# Patient Record
Sex: Male | Born: 1960 | Race: White | Hispanic: No | Marital: Single | Smoking: Former smoker
Health system: Southern US, Community
[De-identification: ages and names within clinical notes are randomized; demographics above are authoritative.]

## PROBLEM LIST (undated history)

## (undated) DIAGNOSIS — R002 Palpitations: Secondary | ICD-10-CM

## (undated) DIAGNOSIS — L409 Psoriasis, unspecified: Secondary | ICD-10-CM

## (undated) DIAGNOSIS — I10 Essential (primary) hypertension: Secondary | ICD-10-CM

## (undated) DIAGNOSIS — E785 Hyperlipidemia, unspecified: Secondary | ICD-10-CM

## (undated) DIAGNOSIS — R911 Solitary pulmonary nodule: Secondary | ICD-10-CM

## (undated) DIAGNOSIS — I2699 Other pulmonary embolism without acute cor pulmonale: Secondary | ICD-10-CM

## (undated) DIAGNOSIS — J449 Chronic obstructive pulmonary disease, unspecified: Secondary | ICD-10-CM

## (undated) DIAGNOSIS — H3320 Serous retinal detachment, unspecified eye: Secondary | ICD-10-CM

## (undated) DIAGNOSIS — Z9289 Personal history of other medical treatment: Secondary | ICD-10-CM

## (undated) HISTORY — DX: Palpitations: R00.2

## (undated) HISTORY — PX: RETINAL DETACHMENT SURGERY: SHX105

## (undated) HISTORY — PX: THORACENTESIS: SHX235

## (undated) HISTORY — DX: Other pulmonary embolism without acute cor pulmonale: I26.99

## (undated) HISTORY — DX: Essential (primary) hypertension: I10

## (undated) HISTORY — DX: Serous retinal detachment, unspecified eye: H33.20

## (undated) HISTORY — DX: Personal history of other medical treatment: Z92.89

## (undated) HISTORY — DX: Solitary pulmonary nodule: R91.1

## (undated) HISTORY — DX: Psoriasis, unspecified: L40.9

## (undated) HISTORY — DX: Hyperlipidemia, unspecified: E78.5

## (undated) HISTORY — DX: Chronic obstructive pulmonary disease, unspecified: J44.9

---

## 2006-09-23 ENCOUNTER — Emergency Department (HOSPITAL_COMMUNITY): Admission: EM | Admit: 2006-09-23 | Discharge: 2006-09-23 | Payer: Self-pay | Admitting: Emergency Medicine

## 2007-09-02 ENCOUNTER — Ambulatory Visit (HOSPITAL_COMMUNITY): Admission: RE | Admit: 2007-09-02 | Discharge: 2007-09-02 | Payer: Self-pay | Admitting: Ophthalmology

## 2008-11-05 ENCOUNTER — Ambulatory Visit (HOSPITAL_COMMUNITY): Admission: RE | Admit: 2008-11-05 | Discharge: 2008-11-05 | Payer: Self-pay | Admitting: Internal Medicine

## 2009-11-23 ENCOUNTER — Encounter: Payer: Self-pay | Admitting: Internal Medicine

## 2010-03-18 ENCOUNTER — Encounter: Payer: Self-pay | Admitting: Internal Medicine

## 2010-03-18 ENCOUNTER — Ambulatory Visit (HOSPITAL_COMMUNITY): Admission: RE | Admit: 2010-03-18 | Discharge: 2010-03-18 | Payer: Self-pay | Admitting: Internal Medicine

## 2010-05-02 ENCOUNTER — Ambulatory Visit: Payer: Self-pay | Admitting: Internal Medicine

## 2010-05-02 DIAGNOSIS — E782 Mixed hyperlipidemia: Secondary | ICD-10-CM | POA: Insufficient documentation

## 2010-05-02 DIAGNOSIS — Z86711 Personal history of pulmonary embolism: Secondary | ICD-10-CM

## 2010-05-02 DIAGNOSIS — I10 Essential (primary) hypertension: Secondary | ICD-10-CM

## 2010-05-02 DIAGNOSIS — F17200 Nicotine dependence, unspecified, uncomplicated: Secondary | ICD-10-CM | POA: Insufficient documentation

## 2010-06-03 ENCOUNTER — Ambulatory Visit: Payer: Self-pay | Admitting: Internal Medicine

## 2010-06-03 DIAGNOSIS — K219 Gastro-esophageal reflux disease without esophagitis: Secondary | ICD-10-CM

## 2010-06-17 ENCOUNTER — Telehealth: Payer: Self-pay | Admitting: Internal Medicine

## 2010-11-08 ENCOUNTER — Ambulatory Visit (HOSPITAL_COMMUNITY): Admission: RE | Admit: 2010-11-08 | Discharge: 2010-11-08 | Payer: Self-pay | Admitting: Dermatology

## 2010-11-10 ENCOUNTER — Encounter: Payer: Self-pay | Admitting: Internal Medicine

## 2010-11-10 ENCOUNTER — Ambulatory Visit (HOSPITAL_COMMUNITY): Admission: RE | Admit: 2010-11-10 | Discharge: 2010-11-10 | Payer: Self-pay | Admitting: Dermatology

## 2010-12-15 ENCOUNTER — Ambulatory Visit: Payer: Self-pay | Admitting: Internal Medicine

## 2010-12-15 DIAGNOSIS — R918 Other nonspecific abnormal finding of lung field: Secondary | ICD-10-CM

## 2010-12-27 ENCOUNTER — Telehealth (INDEPENDENT_AMBULATORY_CARE_PROVIDER_SITE_OTHER): Payer: Self-pay | Admitting: *Deleted

## 2011-01-13 ENCOUNTER — Other Ambulatory Visit: Payer: Self-pay | Admitting: Internal Medicine

## 2011-01-13 DIAGNOSIS — R911 Solitary pulmonary nodule: Secondary | ICD-10-CM

## 2011-01-14 ENCOUNTER — Other Ambulatory Visit: Payer: Self-pay | Admitting: Internal Medicine

## 2011-01-24 NOTE — Letter (Signed)
Summary: [facility]  [facility]   Imported By: Sherian Rein 05/09/2010 07:41:29  _____________________________________________________________________  External Attachment:    Type:   Image     Comment:   External Document

## 2011-01-24 NOTE — Assessment & Plan Note (Signed)
Summary: ROV AFTER PFT ///KP   Copy to:  Dr. Marisue Brooklyn Primary Provider/Referring Provider:  Dr. Elisabeth Most  CC:  follow up after PFT.  no complaints.  History of Present Illness: IOV 05/02/2010: EX 35 pack smoker. Quit  April 2011.   Main issue for him is dyspnea. He feels he cannot get his breath. Feels he needs to take a deep breath. Dyspnea present since 2003 when he had PE.  Stable since 2003 but past 1 year has had progressive worsening of dyspnea. Was able to work out in gym. Currently unable to work out in gym. Dyspnea brought on with exertion and activity. Also feels dyspneic even at rest but worse with exertion. Ventolin helps dyspnea but only briefly. Activities like carrying shoots of concrete truck, < 1 minute of jogging, 8-9 steps on a ladder.   Reports associated cough since 2003. But this is also worse since past 1 year. Used to cough only early morning but now coughing all the time. Cough is a  dry cough. Brought on with exertion. Relieved with rest. Ventolin transiently helps cough. Cough worst in am. Cough when severe can be associated with choking, gagging and vomitting and dizzy. NOTE: has been on ACE INHIBITOR and ATENOLOL  since 2003 (see past med hx)  Also reports associated wheezing. Denies hemoptysis, chest pains, syncope, edema, diplopia  June 03, 2010- Dyspnea, hx tobacco-quit, psoriasis/ Enbrel Comes to review PFT. Dr Marchelle Gearing OOT today. Admits smoking still of and on, with 30+ pack year hx. 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 He is breathing better with much less coughing since meds were changed. Lisinopril was switched to Benicar, B-blocker was stopped. Mostly dyspnea without much wheeze or phlegm. Heart burn is constant problem " all the time". Never had pneumovax. last flu vax year ago PFT- mod obstruct FEV1 3.14/82%; R 0.54, insig resp to BD, DLCO 75%   Asthma History    Asthma Control Assessment:    Age range: 12+ years    Symptoms:  0-2 days/week    Nighttime Awakenings: 0-2/month    Interferes w/ normal activity: some limitations    SABA use (not for EIB): 0-2 days/week    Asthma Control Assessment: Not Well Controlled   Preventive Screening-Counseling & Management  Alcohol-Tobacco     Smoking Status: quit < 6 months     Packs/Day: 1.0     Pack years: 35     Tobacco Counseling: not to resume use of tobacco products  Medications Prior to Update: 1)  Advair Diskus 250-50 Mcg/dose Aepb (Fluticasone-Salmeterol) .... One Puff Twice Daily 2)  Ativan 2 Mg Tabs (Lorazepam) .... Take 1 Tablet By Mouth Once A Day 3)  Ventolin Hfa 108 (90 Base) Mcg/act Aers (Albuterol Sulfate) .... As Needed 4)  Enbrel 50 Mg/ml Soln (Etanercept) .... Take 1 Tablet By Mouth Once A Day 5)  Lipitor 20 Mg Tabs (Atorvastatin Calcium) .... Take 1 Tablet By Mouth Once A Day 6)  Testim 1 % Gel (Testosterone) .... As Needed  Current Medications (verified): 1)  Advair Diskus 500-50 Mcg/dose Aepb (Fluticasone-Salmeterol) .Marland Kitchen.. 1 Puff and Rinse Mouth Well, Twice Daily 2)  Ativan 2 Mg Tabs (Lorazepam) .... Take 3 Tablet By Mouth Once A Day 3)  Lipitor 20 Mg Tabs (Atorvastatin Calcium) .... Take 1 Tablet By Mouth Once A Day 4)  Enbrel 50 Mg/ml Soln (Etanercept) .... Once A Week 5)  Ventolin Hfa 108 (90 Base) Mcg/act Aers (Albuterol Sulfate) .... As Needed 6)  Testim 1 % Gel (Testosterone) .... As Needed 7)  Benicar Hct 40-12.5 Mg Tabs (Olmesartan Medoxomil-Hctz) .... Take 1 Tablet By Mouth Once A Day  Allergies (verified): 1)  ! Haldol  Past History:  Past Surgical History: Last updated: May 04, 2010 @ detached retinal Surgeries  Family History: Last updated: 2010-05-04 Father-emphysema, died age 105, smoker Mother-asthma, allergies  Social History: Last updated: 2010-05-04 Patient states former smoker.  SMOKED X 75YRS AVG 1PPD. Quit in 03/2010.  ETOH-moderate Single  Grew up in [place]. Moved to [place] in 2006  Risk Factors: Smoking Status:  quit < 6 months (06/03/2010) Packs/Day: 1.0 (06/03/2010)  Past Medical History: #Asthma > present all life. Diagnosed in childhood. Has had childhood admissions. Been on ICS since 2003 only > PFT 06/03/10 FEV1 3.14/ 82%, FEV1/FVC 0.54; insig resp to BD, DLCO 75%, Nl Lung Vol  #Hyperlipidemia #Hypertension > On lisinopril  and  2002/2003  #Palpitations...cardiology in [place] > Started atenolol since 2003. REcollects hx of holter. Informed he had 'etxtra beat'. INformed it "not a dangerous condition"  #Pulmonary Embolism 2003 > Does not recollect  tpA or being on lot of oxygen or hypotension. 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 Started in [place].   #Detached retina  #TB Skin test negative > 2005 - 2010, annual checks  Social History: Smoking Status:  quit < 6 months Packs/Day:  1.0 Pack years:  35  Review of Systems      See HPI       The patient complains of shortness of breath with activity and acid heartburn.  The patient denies shortness of breath at rest, productive cough, non-productive cough, coughing up blood, chest pain, irregular heartbeats, indigestion, loss of appetite, weight change, abdominal pain, difficulty swallowing, sore throat, tooth/dental problems, headaches, nasal congestion/difficulty breathing through nose, and sneezing.    Vital Signs:  Patient profile:   50 year old male Height:      73 inches Weight:      219 pounds BMI:     29.00 O2 Sat:      95 % on Room air Pulse rate:   79 / minute BP sitting:   126 / 86  (left arm) Cuff size:   regular  Vitals Entered By: Boone Master CNA/MA (June 03, 2010 4:31 PM)  O2 Flow:  Room air  Physical Exam  Additional Exam:  General: A/Ox3; pleasant and cooperative, NAD, muscular/ fit appearing man SKIN: traces of psoriatic crusitn on arms NODES: no lymphadenopathy HEENT: [place]/AT, EOM- WNL, Conjuctivae-  clear, PERRLA, TM-WNL, Nose- clear, Throat- clear and wnl, Mallampati  II NECK: Supple w/ fair ROM, JVD- none, normal carotid impulses w/o bruits Thyroid- normal to palpation CHEST: Clear to P&A HEART: RRR, no m/g/r heard ABDOMEN: Soft and nl;  ZOX:WRUE, nl pulses, no edema  NEURO: Grossly intact to observation      Impression & Recommendations:  Problem # 1:  TOBACCO ABUSE (ICD-305.1)  Counseling done. He is not smoking at addiction levels now and just needs  motivation.  Problem # 2:  EMPHYSEMA (ICD-492.8)  Moderate Emphysema by PFT. Not needing additional med now, but Spiriva could be considered for retrial  later if needed.  he continues Advair and we will send refill. Recomended and he agrees to pneumovax today given extra risks of Enbrel and COPD Past hx of PE could also explain reduced DLCO.  Problem # 3:  GERD (ICD-530.81) Significant discomfort described. Discussed his otc acid reducer. Recommended twice daily before meals, elevate head of bed. Reflux precautions discussed.  Medications Added to Medication List This Visit: 1)  Advair Diskus 500-50 Mcg/dose Aepb (Fluticasone-salmeterol) .Marland Kitchen.. 1 puff and rinse mouth well, twice daily 2)  Ativan 2 Mg Tabs (Lorazepam) .... Take 3 tablet by mouth once a day 3)  Enbrel 50 Mg/ml Soln (Etanercept) .... Once a week 4)  Benicar Hct 40-25 Mg Tabs (Olmesartan medoxomil-hctz) .... Take 1 tablet by mouth once a day 5)  Benicar Hct 40-12.5 Mg Tabs (Olmesartan medoxomil-hctz) .... Take 1 tablet by mouth once a day  Other Orders: Est. Patient Level IV (13244) Pneumococcal Vaccine (01027) Admin 1st Vaccine (25366)  Patient Instructions: 1)  Follow up with Dr Marchelle Gearing in 3 months 2)  Consider elevating the head of your bed frame on 1 brick under each head leg. 3)  Take your acid reliever twice daily before meals. 4)  Continue Advair- changed to the 500 as requested 5)  Continue Benicar 6)  It's time to call it quits for good on  those cigarettes. 7)  Pneumoavax Prescriptions: BENICAR HCT 40-12.5 MG TABS (OLMESARTAN MEDOXOMIL-HCTZ) Take 1 tablet by mouth once a day  #90 x 3   Entered by:   Boone Master CNA/MA   Authorized by:   Waymon Budge MD   Signed by:   Boone Master CNA/MA on 06/03/2010   Method used:   Faxed to ...       [facility])       [address] [place], [place]  [address]       Ph: [phone]       Fax: [phone]   RxID:   [id number] ADVAIR DISKUS 500-50 MCG/DOSE AEPB (FLUTICASONE-SALMETEROL) 1 puff and rinse mouth well, twice daily  #3 x 3   Entered by:   Boone Master CNA/MA   Authorized by:   Waymon Budge MD   Signed by:   Boone Master CNA/MA on 06/03/2010   Method used:   Faxed to ...       [facility] (mail[facility]order)       [address] [place], [place]  [address]       Ph: [phone]       Fax: [phone]   RxID:   [id number] BENICAR HCT 40-25 MG TABS (OLMESARTAN MEDOXOMIL-HCTZ) Take 1 tablet by mouth once a day  #90 x 3   Entered and Authorized by:   Waymon Budge MD   Signed by:   Waymon Budge MD on 06/03/2010   Method used:   Printed then faxed to ...       [facility] (mail[facility]order)       [address] [place], [place]  [address]       Ph: [phone]       Fax: [phone]   RxID:   [id number] ADVAIR DISKUS 500-50 MCG/DOSE AEPB (FLUTICASONE-SALMETEROL) 1 puff and rinse mouth well, twice daily  #3 x 3   Entered and Authorized by:   Waymon Budge MD   Signed by:   Waymon Budge MD on 06/03/2010   Method used:   Printed then faxed to ...       [facility] (mail[facility])       [address]       [place], [place]  [address]       Ph: [phone]       Fax: [phone]   RxID:   [id number]    Immunizations Administered:  Pneumonia Vaccine:    Vaccine Type: Pneumovax    Site: right deltoid    Mfr: Merck    Dose: 0.5 ml    Route: IM    Given by: Boone Master CNA/MA    Exp. Date: 04/14/2011    Lot #: [id number]     VIS given: 07/22/96 version given June 03, 2010.

## 2011-01-24 NOTE — Assessment & Plan Note (Signed)
Summary: copd w/cardiac enlargement/apc   Visit Type:  Initial Consult Copy to:  Dr. Marisue Brooklyn Primary Provider/Referring Provider:  Dr. Elisabeth Most  CC:  Pulmonary Consult for SOB with activity. Dr. Elisabeth Most diagnosed pt with COPD in 02/2010.Marland Kitchen  History of Present Illness: IOV 05/07/2010: EX 35 pack smoker. Quit  April 2011.   Main issue for him is dyspnea. He feels he cannot get his breath. Feels he needs to take a deep breath. Dyspnea present since 2003 when he had PE.  Stable since 2003 but past 1 year has had progressive worsening of dyspnea. Was able to work out in gym. Currently unable to work out in gym. Dyspnea brought on with exertion and activity. Also feels dyspneic even at rest but worse with exertion. Ventolin helps dyspnea but only briefly. Activities like carrying shoots of concrete truck, < 1 minute of jogging, 8-9 steps on a ladder.   Reports associated cough since 2003. But this is also worse since past 1 year. Used to cough only early morning but now coughing all the time. Cough is a  dry cough. Brought on with exertion. Relieved with rest. Ventolin transiently helps cough. Cough worst in am. Cough when severe can be associated with choking, gagging and vomitting and dizzy. NOTE: has been on ACE INHIBITOR and ATENOLOL  since 2003 (see past med hx)  Also reports associated wheezing. Denies hemoptysis, chest pains, syncope, edema, diplopia  Preventive Screening-Counseling & Management  Alcohol-Tobacco     Smoking Status: quit  Current Medications (verified): 1)  Advair Diskus 250-50 Mcg/dose Aepb (Fluticasone-Salmeterol) .... One Puff Twice Daily 2)  Atenolol 25 Mg Tabs (Atenolol) .... Take 1 Tablet By Mouth Once A Day 3)  Ativan 2 Mg Tabs (Lorazepam) .... Take 1 Tablet By Mouth Once A Day 4)  Lipitor 20 Mg Tabs (Atorvastatin Calcium) .... Take 1 Tablet By Mouth Once A Day 5)  Enbrel 50 Mg/ml Soln (Etanercept) .... Take 1 Tablet By Mouth Once A Day 6)   Lisinopril-Hydrochlorothiazide 20-25 Mg Tabs (Lisinopril-Hydrochlorothiazide) .... Take 1 Tablet By Mouth Once A Day 7)  Ventolin Hfa 108 (90 Base) Mcg/act Aers (Albuterol Sulfate) .... As Needed 8)  Testim 1 % Gel (Testosterone) .... As Needed  Allergies (verified): 1)  ! Haldol  Past History:  Family History: Last updated: 05-07-2010 Father-emphysema, died age 52, smoker Mother-asthma, allergies  Social History: Last updated: 07-May-2010 Patient states former smoker.  SMOKED X 6YRS AVG 1PPD. Quit in 03/2010.  ETOH-moderate Single  Grew up in [place]. Moved to [place] in 2006  Risk Factors: Smoking Status: quit (May 07, 2010)  Past Medical History: #Asthma > present all life. Diagnosed in childhood. Has had childhood admissions. Been on ICS since 2003 only  #Hyperlipidemia #Hypertension > On lisinopril  and  2002/2003  #Palpitations...cardiology in [place] > Started atenolol since 2003. REcollects hx of holter. Informed he had 'etxtra beat'. INformed it "not a dangerous condition"  #Pulmonary Embolism 2003 > Does not recollect  tpA or being on lot of oxygen or hypotension. 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 Started in [place].   #Detached retina  #TB Skin test negative > 2005 - 2010, annual checks  Past Surgical History: @ detached retinal Surgeries  Family History: Father-emphysema, died age 75, smoker Mother-asthma, allergies  Social History: Patient states former smoker.  SMOKED X 6YRS AVG 1PPD. Quit in 03/2010.  ETOH-moderate Single  Grew up  in [place]. Moved to [place] in 2006 Smoking Status:  quit  Review of Systems       The patient complains of shortness of breath with activity, shortness of breath at rest, productive cough, non-productive cough, acid heartburn, and anxiety.  The patient denies coughing up blood, chest pain, irregular heartbeats, indigestion, loss of  appetite, weight change, abdominal pain, difficulty swallowing, sore throat, tooth/dental problems, headaches, nasal congestion/difficulty breathing through nose, sneezing, itching, ear ache, depression, hand/feet swelling, joint stiffness or pain, rash, change in color of mucus, and fever.    Vital Signs:  Patient profile:   50 year old male Height:      70 inches Weight:      219 pounds BMI:     31.54 O2 Sat:      98 % on Room air Temp:     97.2 degrees F oral Pulse rate:   70 / minute BP sitting:   122 / 78  (right arm) Cuff size:   regular  Vitals Entered By: Carron Curie CMA (May 02, 2010 3:37 PM)  O2 Flow:  Room air CC: Pulmonary Consult for SOB with activity. Dr. Elisabeth Most diagnosed pt with COPD in 02/2010. Comments Medications reviewed with patient Carron Curie CMA  May 02, 2010 3:42 PM Daytime phone number verified with patient. Ambulatory Pulse Oximetry  Resting; HR_65____    02 Sat__99%RA___  Lap1 (185 feet)   HR_86____   02 Sat_98RA____ Lap2 (185 feet)   HR__87___   02 Sat__97%RA___    Lap3 (185 feet)   HR_84____   02 Sat__96%RA___  _X__Test Completed without Difficulty ___Test Stopped due to:      Physical Exam  General:  well developed, well nourished, in no acute distress Head:  normocephalic and atraumatic Eyes:  PERRLA/EOM intact; conjunctiva and sclera clear Ears:  left ear fine rt ear - ? plaque like lesions on ear drum Nose:  no deformity, discharge, inflammation, or lesions Mouth:  no deformity or lesions Neck:  no masses, thyromegaly, or abnormal cervical nodes Chest Wall:  no deformities noted Lungs:  clear bilaterally to auscultation and percussion Heart:  regular rate and rhythm, S1, S2 without murmurs, rubs, gallops, or clicks Abdomen:  bowel sounds positive; abdomen soft and non-tender without masses, or organomegaly Msk:  no deformity or scoliosis noted with normal posture Pulses:  pulses normal Extremities:  no clubbing,  cyanosis, edema, or deformity noted Neurologic:  CN II-XII grossly intact with normal reflexes, coordination, muscle strength and tone Skin:  intact without lesions or rashes Cervical Nodes:  no significant adenopathy Axillary Nodes:  no significant adenopathy Psych:  alert and cooperative; normal mood and affect; normal attention span and concentration   CXR  Procedure date:  03/18/2010  Findings:       Clinical Data: Cough for 2 weeks.  History of asthma.  Nonsmoker.    CHEST - 2 VIEW    Comparison: 09/02/2007    Findings: Lung fields demonstrate mild COPD changes.  Taking this   into consideration heart size is mildly enlarged and stable.  A   normal mediastinal configuration is seen.    The lung fields demonstrate no focal infiltrates or signs of   congestive failure.  No significant peribronchial cuffing or   pleural fluid is seen.    Bony structures appear intact.    IMPRESSION:   Stable cardiopulmonary appearance with no acute abnormality noted.   Mild COPD changes with mild cardiac enlargement.    Read By:  Bertha Stakes,  M.D.   Released By:  Bertha Stakes,  M.D.   Comments:      independently reviewed  Impression & Recommendations:  Problem # 1:  SHORTNESS OF BREATH (SOB) (ICD-786.05) Assessment New  Suspect dyspnean and cough are due to underlying asthma made worse by atenolol and lisinopril since 2003 and possibly byhim developing copd last year due to smoking.   plan first stop atenolol and lisinopril reassess with PFTs in 1 month  Orders: Prescription Created Electronically [id number])  Problem # 2:  COUGH (ICD-786.2) Assessment: New  per dyspnea  Orders: Prescription Created Electronically [phone])  Problem # 3:  TOBACCO ABUSE (ICD-305.1) Assessment: New  35 pack smoker. Quit April 2011. Encouraged remission  Orders: Prescription Created Electronically [phone])  Problem # 4:  HYPERTENSION (ICD-401.9) Assessment:  New PLAN  The following medications were removed from the medication list:    Atenolol 25 Mg Tabs (Atenolol) .Marland Kitchen... Take 1 tablet by mouth once a day    Lisinopril-hydrochlorothiazide 20-25 Mg Tabs (Lisinopril-hydrochlorothiazide) .Marland Kitchen... Take 1 tablet by mouth once a day His updated medication list for this problem includes:    Benicar Hct 40-12.5 Mg Tabs (Olmesartan medoxomil-hctz) ..... One tablet by mouth daily  Problem # 5:  PALPITATIONS, HX OF (ICD-V12.50) Assessment: New  per hx these were considered benign in 2003. Been on atenolol since then without recurrence. We are stopping atenolol due to cough, wheezing and dyspnea. We will monitor recurrence of palpitations. If they recur, start bisoprolol and send to cards  Orders: Prescription Created Electronically [phone])  Medications Added to Medication List This Visit: 1)  Advair Diskus 250-50 Mcg/dose Aepb (Fluticasone-salmeterol) .... One puff twice daily 2)  Atenolol 25 Mg Tabs (Atenolol) .... Take 1 tablet by mouth once a day 3)  Ativan 2 Mg Tabs (Lorazepam) .... Take 1 tablet by mouth once a day 4)  Lipitor 20 Mg Tabs (Atorvastatin calcium) .... Take 1 tablet by mouth once a day 5)  Enbrel 50 Mg/ml Soln (Etanercept) .... Take 1 tablet by mouth once a day 6)  Lisinopril-hydrochlorothiazide 20-25 Mg Tabs (Lisinopril-hydrochlorothiazide) .... Take 1 tablet by mouth once a day 7)  Ventolin Hfa 108 (90 Base) Mcg/act Aers (Albuterol sulfate) .... As needed 8)  Testim 1 % Gel (Testosterone) .... As needed 9)  Benicar Hct 40-12.5 Mg Tabs (Olmesartan medoxomil-hctz) .... One tablet by mouth daily  Other Orders: Consultation Level V (51884)  Patient Instructions: 1)  stop atenolol 2)  stop lisinopril 3)  start benicar 40-12.5mg  by mouth daily 4)  check bp daily 5)  if high call us 6)  if you have recurrence of palpitations call us 7)  return in 1 month to review progress 8)  full PFT at time of return 9)  talk to your doctor about  your right ear - looks abnormal Prescriptions: BENICAR HCT 40-12.5 MG  TABS (OLMESARTAN MEDOXOMIL-HCTZ) One tablet by mouth daily  #30 x 2   Entered and Authorized by:   Kalman Shan MD   Signed by:   Kalman Shan MD on 05/02/2010   Method used:   Print then Give to Patient   RxID:   [id number]

## 2011-01-24 NOTE — Miscellaneous (Signed)
Summary: Orders Update pft charges  Clinical Lists Changes  Orders: Added new Service order of Carbon Monoxide diffusing w/capacity (94720) - Signed Added new Service order of Lung Volumes (94240) - Signed Added new Service order of Spirometry (Pre & Post) (94060) - Signed 

## 2011-01-24 NOTE — Letter (Signed)
Summary: [facility]  [facility]   Imported By: Sherian Rein 05/09/2010 07:40:08  _____________________________________________________________________  External Attachment:    Type:   Image     Comment:   External Document

## 2011-01-24 NOTE — Progress Notes (Signed)
Summary: Patient switched back from Advair 500 to 250  Phone Note From Pharmacy   Summary of Call: Letter from [facility]- Ptient has filled Advair at the 250 sterngth. Med list updated. Initial call taken by: Waymon Budge MD,  June 17, 2010 9:05 AM    New/Updated Medications: ADVAIR DISKUS 250-50 MCG/DOSE AEPB (FLUTICASONE-SALMETEROL) 1 puff and rinse mouth twice daily Prescriptions: ADVAIR DISKUS 250-50 MCG/DOSE AEPB (FLUTICASONE-SALMETEROL) 1 puff and rinse mouth twice daily  #3 x 3   Entered and Authorized by:   Waymon Budge MD   Signed by:   Waymon Budge MD on 06/17/2010   Method used:   Historical   RxID:   [id number]

## 2011-01-26 NOTE — Assessment & Plan Note (Signed)
Summary: LUNG NODULE - CT (DONE AT [facility]) //KP   Visit Type:  Follow-up Copy to:  Dr. Marisue Brooklyn Primary Amantha Sklar/Referring Kamy Poinsett:  Dr. Elisabeth Most - PMD, Dr. Elmon Else - Dermatology  CC:  Pt here to review CT results. .  History of Present Illness: Followup mulitfactoria cough (copd, ace inhibitor), smoker (quit april 2011,relapsed summer 2011), emphsyema/copd (normal PFT while on advair but isolated low dlco 77% as of june 2011, CT chest +ve emphysema - nov 2011). 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  I have not seen him since March 2011. In interim he saw Dr. Maple Hudson in June 2011 who reivwed his PFTs and advised to continue advair. His cough is significantly improved to nearly resolved after he stopped ACE inhibitor. Curently benicar but has cost issues. Feels stopping atenolol did not help resolve cough. Palpiations back after stoping atenolol and therefore taking it as needed. Stil has class 2 dyspnea despite advair compliance; dyspneic climbing 2 flights but no chest pain, wheeze. In terms of smoking, relapsed. Now quit again for 3 months using electronic cigarettes. Main new issue is that dermatologist did cxr 10/29/2010 and showed LLL nodule. CT 11/10/2010 - 5mm RLLnodule subpleuarl suggestive of lmph node. Therefore, TNF-alpha blockade is on hold     Preventive Screening-Counseling & Management  Alcohol-Tobacco     Smoking Status: quit < 6 months     Packs/Day: 1.0     Year Quit: 2011     Pack years: 57     Tobacco Counseling: not to resume use of tobacco products  Comments: using electorinic cigaretters  Current Medications (verified): 1)  Advair Diskus 250-50 Mcg/dose Aepb (Fluticasone-Salmeterol) .Marland Kitchen.. 1 Puff and Rinse Mouth Twice Daily 2)  Ventolin Hfa 108 (90 Base) Mcg/act Aers (Albuterol Sulfate) .... As Needed 3)  Ativan 2 Mg Tabs  (Lorazepam) .... Take 1 Tablet By [employer] Times A Day 4)  Lipitor 20 Mg Tabs (Atorvastatin Calcium) .... Take 1 Tablet By Mouth Once A Day 5)  Benicar Hct 40-12.5 Mg Tabs (Olmesartan Medoxomil-Hctz) .... Take 1 Tablet By Mouth Once A Day  Allergies (verified): 1)  ! Haldol  Past History:  Past medical, surgical, family and social histories (including risk factors) reviewed, and no changes noted (except as noted below).  Past Medical History: #Asthma  - present all life. Diagnosed in childhood. Has had childhood admissions. Been on ICS since 2003 only  -  PFT 06/03/10 FEV1 3.14/ 82%, FEV1/FVC 0.54; insig resp to BD, DLCO 75%, Nl Lung Vol  #Hyperlipidemia #Hypertension  - On lisinopril  and  2002/2003  - change to ARB march 2011  #Palpitations...cardiology in [place]  - Started atenolol since 2003. REcollects hx of holter. Informed he had 'etxtra beat'. INformed it "not a dangerous condition" - change to lopressor 12/15/2010  #Pulmonary Embolism 2003  - Does not recollect  tpA or being on lot of oxygen or hypotension. 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 Started in [place]. Stopped 2011 by [place] derm due to nodule on chest  #Detached retina  #TB Skin test negative  - 2005 - 2010, annual checks  #RLL Nodule 5mm on CT 11/10/2010  - likely lymph node  Past Surgical History: Reviewed history from 05/02/2010 and no changes required. @ detached  retinal Surgeries  Family History: Reviewed history from 05/02/2010 and no changes required. Father-emphysema, died age 51, smoker Mother-asthma, allergies  Social History: Reviewed history from 05/02/2010 and no changes required. Patient states former smoker.  SMOKED X 46YRS AVG 1PPD. Quit in 03/2010.  ETOH-moderate Single  Grew up in [place]. Moved to [place] in 2006  Review of Systems  The patient denies shortness of breath with  activity, shortness of breath at rest, productive cough, non-productive cough, coughing up blood, chest pain, irregular heartbeats, acid heartburn, indigestion, loss of appetite, weight change, abdominal pain, difficulty swallowing, sore throat, tooth/dental problems, headaches, nasal congestion/difficulty breathing through nose, sneezing, itching, ear ache, anxiety, depression, hand/feet swelling, joint stiffness or pain, rash, change in color of mucus, and fever.    Vital Signs:  Patient profile:   50 year old male Height:      73 inches Weight:      234.38 pounds BMI:     31.03 O2 Sat:      95 % on Room air Temp:     98.2 degrees F oral Pulse rate:   76 / minute BP sitting:   114 / 78  (right arm) Cuff size:   regular  Vitals Entered By: Carron Curie CMA (December 15, 2010 9:00 AM)  O2 Flow:  Room air CC: Pt here to review CT results.  Comments Medications reviewed with patient Carron Curie CMA  December 15, 2010 9:02 AM Daytime phone number verified with patient.    Physical Exam  General:  well developed, well nourished, in no acute distress Head:  normocephalic and atraumatic Eyes:  PERRLA/EOM intact; conjunctiva and sclera clear Ears:  left ear fine rt ear - ? plaque like lesions on ear drum Nose:  no deformity, discharge, inflammation, or lesions Mouth:  no deformity or lesions Neck:  no masses, thyromegaly, or abnormal cervical nodes Chest Wall:  no deformities noted Lungs:  clear bilaterally to auscultation and percussion Heart:  regular rate and rhythm, S1, S2 without murmurs, rubs, gallops, or clicks Abdomen:  bowel sounds positive; abdomen soft and non-tender without masses, or organomegaly Msk:  no deformity or scoliosis noted with normal posture Pulses:  pulses normal Extremities:  no clubbing, cyanosis, edema, or deformity noted Neurologic:  CN II-XII grossly intact with normal reflexes, coordination, muscle strength and tone Skin:  psoriasis on  shin Cervical Nodes:  no significant adenopathy Axillary Nodes:  no significant adenopathy Psych:  alert and cooperative; normal mood and affect; normal attention span and concentration   CT of Chest  Procedure date:  11/10/2010  Findings:      cxr 10/29/2010 and showed LLL nodule. CT 11/10/2010 - 5mm RLLnodule subpleuarl suggestive of lmph node.   Also emphysema Aso bilatera LL scars - presumably from old PE  Impression & Recommendations:  Problem # 1:  PULMONARY NODULE, RIGHT LOWER LOBE (ICD-518.89) Assessment New CXR 10/29/2010 suggested LLL nodule but on CT 11/10/2010 this is  a scar. However, CT shows RLL 5mm nodule that is smooth and subpleural. Despite age and smoking as risk fctors for lung cancer, based on size, location near pleural and smoothness this is unlikely malignancy. GRanuloma from stay in [place] is possible. But lymph node is moist likely  PLAN - serial monitoring for 2 year is advised, next CT chest SUPER D PROTOCOL  6 months - Wil biopsy if need for TNF-alpha blocker need  arises (currently only dermal psoriasis and not a strong indication for humira/enbrel per Dr Emily Filbert and moreover  patient unwilling) or it enlarges provided ENB amenable-  d/w Dr Emily Filbert his dermatologist  Orders: Radiology Referral (Radiology) Est. Patient Level V [phone])  Problem # 2:  TOBACCO ABUSE (ICD-305.1) Assessment: Improved  encourage to staty quit advised I would read abut electronic cigs efficacty and safety  cojunselling  Orders: Est. Patient Level V (62130) Tobacco use cessation intermediate 3-10 minutes (86578)  Problem # 3:  COUGH (ICD-786.2) Assessment: Improved  resolved after dc of ace inhibitor $ issues with benicar so will switch to cozaar he will call in 3 weeks to get 3 month mail order if this tolerable and affordable  Orders: Est. Patient Level V (46962)  Problem # 4:  SHORTNESS OF BREATH (SOB) (ICD-786.05) Assessment: Unchanged this is due to  deconditioning. COPD appears well controlled on PFTs in june 2011. He has schedue issues and cannot attend rehab  plan advised gentle aerobic exercises iwth HR and pulse ox limit if this does not work, then will have to consider echo/right heart cath to assess for pulmonary hypertension given hx of PE  Problem # 5:  EMPHYSEMA (ICD-492.8) Assessment: Unchanged stable disease  plan continue advair next visit check alpha 1 due to family hx  Problem # 6:  PALPITATIONS, OCCASIONAL (ICD-785.1) Assessment: Unchanged try lopressor instead of atenolold  His updated medication list for this problem includes:    Metoprolol Tartrate 50 Mg Tabs (Metoprolol tartrate) .Marland Kitchen... Take half tablet daily  Orders: Prescription Created Electronically [phone])  Medications Added to Medication List This Visit: 1)  Ativan 2 Mg Tabs (Lorazepam) .... Take 1 tablet by mouththree times a day 2)  Losartan Potassium-hctz 50-12.5 Mg Tabs (Losartan potassium-hctz) .... Take 1 tablet daily 3)  Metoprolol Tartrate 50 Mg Tabs (Metoprolol tartrate) .... Take half tablet daily  Patient Instructions: 1)  #SHORTNESS OF BREATH/COPD 2)   - this is from copd and being out of shape (latter more likely) 3)  - continue advair - take 1 sample 4)   - do aerobic exercise - do not get HR >140 or let oxygen level go below 88% 5)  #COUGH 6)   - change benicar to cozaar  7)   - call 1 in 3 weeks to report if cozaar working for you and $ effective. If so, then we will call 3 month supply 8)  #PALPITATIONS 9)   - try metoprolol instead of atenolol 10)   - call i n3 weeks so we can send order to mail order 11)  #NODULE RIGHT LOWER LOBE 12)   - repeat CT chest in 6 months from thanksgiving 2011 13)  #RETURN 14)   - after CT chest 15)  #SMOKING 16)   - be in remission Prescriptions: METOPROLOL TARTRATE 50 MG TABS (METOPROLOL TARTRATE) take half tablet daily  #15 x 1   Entered and Authorized by:   Kalman Shan MD   Signed by:    Kalman Shan MD on 12/15/2010   Method used:   Electronically to        [facility] [address] (retail)       [address]       [place], [place]  [address]       Ph: [phone]       Fax: [phone]   RxID:   [id number] LOSARTAN POTASSIUM-HCTZ 50-12.5 MG TABS (LOSARTAN POTASSIUM-HCTZ) take 1 tablet daily  #30 x 1   Entered and Authorized by:   Carmin Muskrat  Ramaswamy MD   Signed by:   Kalman Shan MD on 12/15/2010   Method used:   Electronically to        [facility] [address] (retail)       [address]       [place], [place]  [address]       Ph: [phone]       Fax: [phone]   RxID:   [id number]

## 2011-01-26 NOTE — Progress Notes (Signed)
Summary: high bp/ new meds > increase losartan to 100-12.5mg   Phone Note Call from Patient Call back at Home Phone [phone]   Caller: Patient Call For: ramaswamy Summary of Call: pt c/o high bp due to change in bp meds at last ov 12/22. [facility] [employer] Initial call taken by: Tivis Ringer, CNA,  December 27, 2010 2:35 PM  Follow-up for Phone Call        Spoke with pt.  He states that since BP meds were changed his BP has been elevated- highest reading was 180/100 and average reading is around 145/90.  He was taking benicar 40/12.5 and as of last ov 12/22 was switched to losartan 50/12.5 and metoprolol tartrate 50 mg 1/2 once daily.  Will forward to doc of the day. SN, pls advise thanks Follow-up by: Vernie Murders,  December 27, 2010 4:42 PM  Additional Follow-up for Phone Call Additional follow up Details #1::        per SN: need to increase losartan to 100-12.5mg , this is equivalent to his benicar 40mg .  called spoke with patient, advised of SN's recs.  pt okay with this and verbalized his understanding.  pt requests rx be sent to [facility] [address], 30day supply.  pt to call in 2 weeks with update on BP's.  pt declined f/u appt with MR at this time, prefers to see how the increase works for him. Additional Follow-up by: Boone Master CNA/MA,  December 27, 2010 5:12 PM    New/Updated Medications: LOSARTAN POTASSIUM-HCTZ 100-12.5 MG TABS (LOSARTAN POTASSIUM-HCTZ) Take 1 tablet by mouth once a day Prescriptions: LOSARTAN POTASSIUM-HCTZ 100-12.5 MG TABS (LOSARTAN POTASSIUM-HCTZ) Take 1 tablet by mouth once a day  #30 x 1   Entered by:   Boone Master CNA/MA   Authorized by:   Michele Mcalpine MD   Signed by:   Boone Master CNA/MA on 12/27/2010   Method used:   Electronically to        [facility] [address] (retail)       [address]       [place], [place]  [address]       Ph: [phone]       Fax: [phone]   RxID:    [id number]

## 2011-02-02 ENCOUNTER — Telehealth (INDEPENDENT_AMBULATORY_CARE_PROVIDER_SITE_OTHER): Payer: Self-pay | Admitting: *Deleted

## 2011-02-09 NOTE — Progress Notes (Signed)
Summary: refills--LMOMTCB x1  Phone Note Call from Patient Call back at [phone]   Caller: girlfriend//rosemarie Call For: ramaswamy Reason for Call: Refill Medication Summary of Call: Need refills on losartan potasssium-hctz 100-12.5mg  and metoprolol tartrate 50mg  also wants to know if the pharmacy will cut metoprolol tartrate in half.//[facility] Initial call taken by: Darletta Moll,  February 02, 2011 11:31 AM  Follow-up for Phone Call        ATC line busy x 2 [facility]CB Vernie Murders  February 02, 2011 12:20 PM  [facility].Michel Bickers [facility]  February 02, 2011 2:17 PM   Additional Follow-up for Phone Call Additional follow up Details #1::        Spoke with [facility].  She states that metoprolol and losartan have worked well for BP.  Would like 90 day supply of each sent to pharm.  I asked that she aask the pharmacist to see if they can cut toprol in half.  Additional Follow-up by: Vernie Murders,  February 03, 2011 9:23 AM    New/Updated Medications: METOPROLOL TARTRATE 50 MG TABS (METOPROLOL TARTRATE) 1/2 once daily Prescriptions: METOPROLOL TARTRATE 50 MG TABS (METOPROLOL TARTRATE) 1/2 once daily  #45 x 3   Entered by:   Vernie Murders   Authorized by:   Kalman Shan MD   Signed by:   Vernie Murders on 02/03/2011   Method used:   Electronically to        [facility] [address] (retail)       [address]       [place], [place]  [address]       Ph: [phone]       Fax: [phone]   RxID:   [id number] LOSARTAN POTASSIUM-HCTZ 100-12.5 MG TABS (LOSARTAN POTASSIUM-HCTZ) Take 1 tablet by mouth once a day  #90 x 3   Entered by:   Vernie Murders   Authorized by:   Kalman Shan MD   Signed by:   Vernie Murders on 02/03/2011   Method used:   Electronically to        [facility] [address] (retail)       [address]       [place], [place]  [address]       Ph: [phone]       Fax:  [phone]   RxID:   [id number]

## 2011-05-05 ENCOUNTER — Ambulatory Visit (HOSPITAL_COMMUNITY)
Admission: RE | Admit: 2011-05-05 | Discharge: 2011-05-05 | Disposition: A | Discharge status: Home / Self Care | Payer: BC Managed Care – PPO | Source: Ambulatory Visit | Attending: Internal Medicine | Admitting: Internal Medicine

## 2011-05-05 ENCOUNTER — Other Ambulatory Visit (HOSPITAL_COMMUNITY): Payer: Self-pay | Admitting: Internal Medicine

## 2011-05-05 DIAGNOSIS — R079 Chest pain, unspecified: Secondary | ICD-10-CM | POA: Insufficient documentation

## 2011-05-05 DIAGNOSIS — W19XXXA Unspecified fall, initial encounter: Secondary | ICD-10-CM

## 2011-05-05 DIAGNOSIS — R071 Chest pain on breathing: Secondary | ICD-10-CM | POA: Insufficient documentation

## 2011-05-05 DIAGNOSIS — R52 Pain, unspecified: Secondary | ICD-10-CM

## 2011-05-05 DIAGNOSIS — W1809XA Striking against other object with subsequent fall, initial encounter: Secondary | ICD-10-CM | POA: Insufficient documentation

## 2011-05-09 NOTE — Op Note (Signed)
NAME:  Samuel Sharp, Samuel Sharp                   ACCOUNT NO.:  [id number]   MEDICAL RECORD NO.:  [id number]          PATIENT TYPE:  AMB   LOCATION:  SDS                          FACILITY:  [facility]   PHYSICIAN:  Jillyn Hidden A. Rankin, M.D.   DATE OF BIRTH:  07-Apr-1961   DATE OF PROCEDURE:  DATE OF DISCHARGE:                               OPERATIVE REPORT   PREOPERATIVE DIAGNOSIS:  Retinal detachment - right eye with extension  of [place]   POSTOPERATIVE DIAGNOSIS:  Retinal detachment - right eye with extension  of the detachment status post previous retinopexy done on the [facility].   PROCEDURE:  Scleral buckle using 240, 70 and 287 _____silicone  explants_____  on the right eye, supranasal quadrant.   SURGEON:  Alford Highland. Rankin, M.D.   ANESTHESIA:  General endotracheal anesthesia.   INDICATIONS FOR PROCEDURE:  The patient is a 50 year old man who  suffered severe trauma about the orbits and globes of both eyes some  years ago and he had a retinal detachment repair on the left eye on the  [facility] and had a localized retinal detachment treated with laser  retinopexy in an attempt to prevent its progression.  Now he has  symptomatic progression of the detachment in the right eye leading to  supranasal detachment threatening the macular region.  This is an  attempt to reattach the retina and to prevent progression of the retinal  detachment.  The patient understands the risks of anesthesia including  loss  of the eye including but not limited to hemorrhage, infection,  scarring, need for further surgery, no change in vision, loss of vision,  progressive disease despite intervention.  Appropriate signed consent  was obtained.  The patient was taken to the operating room.  In the  operating room appropriate monitors followed by general endotracheal  anesthesia.  The right periauricular region was sterilely prepped and  draped in the usual  sterile fashion.  A lid speculum applied.  Conjunctival _peritomy__  was advanced to 360 degrees with a ___relaxing  incision was made inferiorly.  The rectus muscle was isolated on 2-0  silk ties.  Ophthalmoscopy was then used to direct retinal cryopexy on  the edge of the detachment as well as in the vitreous space where there  appeared to be atrophic holes.   At this time, _the buckle__  was selected and placed in the supranasal  quadrant.   Excellent support was identified and there was enough fluid to allow  drainage.  Drainage in the bed of the buckle was carried out with 26-  gauge needle with direct observation.  Fluid was removed without  difficulty.  No complications or hemorrhage occurred.  The buckle and  the band were tightened to the appropriate tension.  The edges of the  band were trimmed.  The bed of the buckle was irrigated with bug juice.  Conjunctival was then closed with interrupted 7-0 Vicryl and __Tenons_  was closed as well to the limbus.   At this time,  subconjunctival Decadron was applied.  Sterile patch also  applied.  The patient awakened from anesthesia without difficulty and  taken to recovery room in good stable condition.      Alford Highland Rankin, M.D.  Electronically Signed     GAR/MEDQ  D:  09/02/2007  T:  09/03/2007  Job:  [id number]

## 2011-05-17 ENCOUNTER — Encounter: Payer: Self-pay | Admitting: Internal Medicine

## 2011-05-18 ENCOUNTER — Ambulatory Visit (INDEPENDENT_AMBULATORY_CARE_PROVIDER_SITE_OTHER)
Admission: RE | Admit: 2011-05-18 | Discharge: 2011-05-18 | Disposition: A | Discharge status: Home / Self Care | Payer: BC Managed Care – PPO | Source: Ambulatory Visit | Attending: Internal Medicine | Admitting: Internal Medicine

## 2011-05-18 DIAGNOSIS — R911 Solitary pulmonary nodule: Secondary | ICD-10-CM

## 2011-05-18 DIAGNOSIS — J984 Other disorders of lung: Secondary | ICD-10-CM

## 2011-05-25 ENCOUNTER — Ambulatory Visit (INDEPENDENT_AMBULATORY_CARE_PROVIDER_SITE_OTHER): Payer: BC Managed Care – PPO | Admitting: Internal Medicine

## 2011-05-25 ENCOUNTER — Encounter: Payer: Self-pay | Admitting: Internal Medicine

## 2011-05-25 VITALS — BP 122/82 | HR 80 | Ht 73.0 in | Wt 211.6 lb

## 2011-05-25 DIAGNOSIS — Z8679 Personal history of other diseases of the circulatory system: Secondary | ICD-10-CM

## 2011-05-25 DIAGNOSIS — R911 Solitary pulmonary nodule: Secondary | ICD-10-CM

## 2011-05-25 DIAGNOSIS — J984 Other disorders of lung: Secondary | ICD-10-CM

## 2011-05-25 DIAGNOSIS — I2699 Other pulmonary embolism without acute cor pulmonale: Secondary | ICD-10-CM

## 2011-05-25 DIAGNOSIS — L409 Psoriasis, unspecified: Secondary | ICD-10-CM

## 2011-05-25 DIAGNOSIS — F17200 Nicotine dependence, unspecified, uncomplicated: Secondary | ICD-10-CM

## 2011-05-25 DIAGNOSIS — L408 Other psoriasis: Secondary | ICD-10-CM

## 2011-05-25 DIAGNOSIS — J449 Chronic obstructive pulmonary disease, unspecified: Secondary | ICD-10-CM

## 2011-05-25 DIAGNOSIS — R05 Cough: Secondary | ICD-10-CM

## 2011-05-25 DIAGNOSIS — R002 Palpitations: Secondary | ICD-10-CM

## 2011-05-25 NOTE — Patient Instructions (Addendum)
#  spot in Rt lung lower part  - is stable Nov 2011 - May 2012 over 6 months  - Next CT chest without contrats in 9-12 months  - I will talk to Dr. Emily Filbert that this is most likely a lymph node and the fact is low risk for humira for your dermal psoriasis #smoking   - congrats on quitting  - work on quitting electronic cigarettes too #Asthma/COPD  - take few advair samples  - my nurse will do a blood test for alpha 1 Camari test due to family hx #Followup  - 9-12 months after CT chest (changed to 3-4 months after d/w Dr Emily Filbert the following day) - I will call you next several days after talking to Dr. Emily Filbert  .

## 2011-05-25 NOTE — Progress Notes (Signed)
  Subjective:    Patient ID: Samuel Sharp, male    DOB: Jan 22, 1961, 50 y.o.   MRN: [id number]  HPI  Followup RLL 6mm pulmonary nodule since Nov 2011,  tobacco abuse, emphysema/asthma in setting of dermal psoriasis for humira clearance  Not seen since Dec 2011. He had CT chest 05/18/2010. RLL 6 mm nodule is stable and unchanged; likely lymph node. REports no cough or dyspnea.  Takes ICS. REports smoking in remission but using electronic cigarettes. 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 No other complaints  Review of Systems  Constitutional: Negative for fever and unexpected weight change.  HENT: Negative for ear pain, nosebleeds, congestion, sore throat, rhinorrhea, sneezing, trouble swallowing, dental problem, postnasal drip and sinus pressure.   Eyes: Negative for redness and itching.  Respiratory: Negative for cough, chest tightness, shortness of breath and wheezing.   Cardiovascular: Negative for palpitations and leg swelling.  Gastrointestinal: Negative for nausea and vomiting.  Genitourinary: Negative for dysuria.  Musculoskeletal: Negative for joint swelling.  Skin: Positive for rash.       Worsening psoriasis all over  Neurological: Negative for headaches.  Hematological: Does not bruise/bleed easily.  Psychiatric/Behavioral: Negative for dysphoric mood. The patient is not nervous/anxious.        Objective:   Physical Exam  Nursing note and vitals reviewed. Constitutional: He is oriented to person, place, and time. He appears well-developed and well-nourished. No distress.  HENT:  Head: Normocephalic and atraumatic.  Right Ear: External ear normal.  Left Ear: External ear normal.  Mouth/Throat: Oropharynx is clear and moist. No oropharyngeal exudate.  Eyes: Conjunctivae and EOM are normal. Pupils are equal, round, and reactive to light. Right eye exhibits no discharge. Left eye exhibits no discharge.  No scleral icterus.  Neck: Normal range of motion. Neck supple. No JVD present. No tracheal deviation present. No thyromegaly present.  Cardiovascular: Normal rate, regular rhythm and intact distal pulses.  Exam reveals no gallop and no friction rub.   No murmur heard. Pulmonary/Chest: Effort normal and breath sounds normal. No respiratory distress. He has no wheezes. He has no rales. He exhibits no tenderness.  Abdominal: Soft. Bowel sounds are normal. He exhibits no distension and no mass. There is no tenderness. There is no rebound and no guarding.  Musculoskeletal: Normal range of motion. He exhibits no edema and no tenderness.  Lymphadenopathy:    He has no cervical adenopathy.  Neurological: He is alert and oriented to person, place, and time. He has normal reflexes. No cranial nerve deficit. Coordination normal.  Skin: Skin is warm and dry. Rash noted. He is not diaphoretic. No erythema. No pallor.       Psoriasis - scattered  Psychiatric: He has a normal mood and affect. His behavior is normal. Judgment and thought content normal.          Assessment & Plan:

## 2011-05-26 ENCOUNTER — Telehealth: Payer: Self-pay | Admitting: Internal Medicine

## 2011-05-26 DIAGNOSIS — R911 Solitary pulmonary nodule: Secondary | ICD-10-CM

## 2011-05-26 NOTE — Telephone Encounter (Signed)
I spoke to Dr. Elmon Else. Expressed that nodule in lung is likely a lpymph node and given prior hx of TNF alpha blockade while in [place] wihtout problems and stability of nodule over 6 months and due to size inability to identify non-malignant, non-infectious etiology is impossible we decided to go ahead with humira esp because skin psoriasis is worsening. She wishes for CT in 3 months as opposed to 9 months. I have explained this to patient over phone and he understands the risks and agrees. He will be seeing Dr. Emily Filbert soon and has appt. I will see him in 3 months after CT chest

## 2011-05-29 ENCOUNTER — Encounter: Payer: Self-pay | Admitting: Internal Medicine

## 2011-05-29 DIAGNOSIS — L409 Psoriasis, unspecified: Secondary | ICD-10-CM | POA: Insufficient documentation

## 2011-05-29 DIAGNOSIS — J449 Chronic obstructive pulmonary disease, unspecified: Secondary | ICD-10-CM | POA: Insufficient documentation

## 2011-05-29 NOTE — Assessment & Plan Note (Addendum)
Currently stable per hx. ADvised to continue ICS. Check alpha 1

## 2011-05-29 NOTE — Assessment & Plan Note (Addendum)
#  RLL Nodule 5mm on CT 11/10/2010:  likely lymph node.   - CT  05/19/2011:   5 mm nodule in the right lower lobe,  adjacent to the major fissure (image 36), is unchanged  #TB Skin test negative    - 2005 - 2010, annual checks  This nodule is unchanged in 6 months. Risk of malignancy is very low. Risk of active infection is also low but neitehr can be excluded due to size limitation and difficulty with current diagnostic tools specificity/sensitivity limitations. Following this for 2 years and documenting stability will only rule out malignancy but never rule out fungal granuloma. Clinical pre-test prob points towards benign lymph node.  Meanwhile, psoriasis has worsened. He is very keen to start humira. Given the fact he tolerated TNF alpha blocker in past esp while in [place] and currently being clinically fit for drug and uncertaintly over nodule, I think we can assume that risk of infectious flare from this nodule while on humira will be low. In any event, he understands risks.  I d/w Dr. Elmon Else over phone. She has agreed to start humira for psoriasis but  Has requested we do followup CT chest in 3 months which I have agreed (under normal circumstances he needs ct only in 9-12 months). I have communicated this to patienbt

## 2011-05-29 NOTE — Assessment & Plan Note (Signed)
Quit April 2011, Relapsed summer 2011. On Electronic Cigs since fall 2011 which he continues to this day. Encouraged him to quit even that

## 2011-05-29 NOTE — Assessment & Plan Note (Signed)
This is worse. I  Have d/w Dr. Emily Filbert his dermatologist. She will start humira. She will call him and give appointment.

## 2011-06-12 ENCOUNTER — Telehealth: Payer: Self-pay | Admitting: Internal Medicine

## 2011-06-12 NOTE — Telephone Encounter (Signed)
[id number].Avryl Roehm, CMA  

## 2011-06-12 NOTE — Telephone Encounter (Signed)
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

## 2011-06-12 NOTE — Telephone Encounter (Signed)
Pt aware of results. Samuel Sharp, CMA  

## 2011-06-15 ENCOUNTER — Encounter: Payer: Self-pay | Admitting: Internal Medicine

## 2011-10-06 LAB — BASIC METABOLIC PANEL
Calcium: 9.3
GFR calc Af Amer: >60
GFR calc non Af Amer: >60
Glucose, Bld: 86
Sodium: 139

## 2011-10-06 LAB — CBC
Hemoglobin: 14.7
RDW: 12.8

## 2011-11-07 ENCOUNTER — Telehealth: Payer: Self-pay | Admitting: Pulmonary Disease

## 2011-11-07 NOTE — Telephone Encounter (Signed)
LMTCB

## 2011-11-08 MED ORDER — OLMESARTAN MEDOXOMIL 40 MG PO TABS: 40.0000 mg | ORAL_TABLET | Freq: Every day | ORAL | Status: DC

## 2011-11-08 NOTE — Telephone Encounter (Signed)
Ok to change. HE is using losartan 100. So give benicar 40mg  daily

## 2011-11-08 NOTE — Telephone Encounter (Signed)
Called and spoke with pt. Informed him of MR's response.  Pt requested 90 day supply be sent to [facility] on [address]  Rx sent.  Pt aware.

## 2011-11-08 NOTE — Telephone Encounter (Signed)
Patient is wanting to change BP medication from Losartan back to Benicar. He says his BP readings have been high on the Losartan. Systolic readings are 140 to 150 and diastolic readings are 90 to 100. Pt aware MR is not back in the office until Fri., 11/16 and we will call him back once MR reviews this msg.

## 2012-02-21 ENCOUNTER — Ambulatory Visit: Payer: BC Managed Care – PPO | Admitting: Internal Medicine

## 2012-02-27 ENCOUNTER — Telehealth: Payer: Self-pay | Admitting: Internal Medicine

## 2012-02-27 MED ORDER — OLMESARTAN MEDOXOMIL 40 MG PO TABS: 40.0000 mg | ORAL_TABLET | Freq: Every day | ORAL | Status: DC

## 2012-02-27 NOTE — Telephone Encounter (Signed)
[facility] [address] requesting  benicar 40 mg take 1 tablet qd #90 X4 Allergies  Allergen Reactions  . Ace Inhibitors     cough  . Haloperidol Lactate     REACTION: phsycotic break   Dr Marchelle Gearing is this ok to fill  Thank you

## 2012-02-27 NOTE — Telephone Encounter (Signed)
Refill sent. I will forward the message to my box to take care of other message. Carron Curie, CMA

## 2012-02-27 NOTE — Telephone Encounter (Signed)
That is fine but revieweing office notes from may 2012 his Doc Dr Elmon Else wanted a ct chest in 3 months (instead of 9 months per my plan) for nodule because of his Rx for psoriasis. He was supposed to see me in the fall 2012 but he is yet to. Pls ask him what is going on .   Thanks Samuel Sharp

## 2012-02-28 NOTE — Telephone Encounter (Signed)
I spoke with the pt and he staets he has not f/u because he has not heard anything from our office. I see CT order is in system so I will send message to [facility] to have this scheduled and then OV set with MR after. PCC, please schedule ct chest that is in EPIC and then have pt set rov with MR after the CT. I will be out of the office so please have schedulers set OV. Thanks. Carron Curie, CMA

## 2012-02-28 NOTE — Telephone Encounter (Signed)
Pt aware his ct is scheduled for 03/01/12@3 :30pm

## 2012-03-01 ENCOUNTER — Other Ambulatory Visit: Payer: BC Managed Care – PPO

## 2012-03-05 ENCOUNTER — Telehealth: Payer: Self-pay | Admitting: Internal Medicine

## 2012-03-05 NOTE — Telephone Encounter (Signed)
LMTCB

## 2012-03-06 NOTE — Telephone Encounter (Signed)
Pt returned call > stated he does not want any more tests, does not want to see the physician anymore.  Stopped smoking "over a year ago," has "a lot going on" and "other medical problems."  Feels like he is "done with treatment."  Asked patient if there was anything that our office did to make him feel this way or that dictated this decision.  Pt stated that no, nothing from this office made him come to this decision.  Patient requested that MR be made aware that he feels that MR is a "great doctor;an even better man.  He was so good to me.  Nothing he did at all."  Will forward to MR to make him aware.  *patient had CT scheduled for 3.8.13 that was cancelled and an upcoming appt with MR 3.26.13.

## 2012-03-06 NOTE — Telephone Encounter (Signed)
LMTCB

## 2012-03-19 ENCOUNTER — Ambulatory Visit: Payer: BC Managed Care – PPO | Admitting: Internal Medicine

## 2012-04-16 ENCOUNTER — Other Ambulatory Visit: Payer: Self-pay | Admitting: Internal Medicine

## 2012-04-19 ENCOUNTER — Ambulatory Visit
Admission: RE | Admit: 2012-04-19 | Discharge: 2012-04-19 | Disposition: A | Discharge status: Home / Self Care | Payer: BC Managed Care – PPO | Source: Ambulatory Visit | Attending: Internal Medicine | Admitting: Internal Medicine

## 2012-07-19 ENCOUNTER — Ambulatory Visit
Admission: RE | Admit: 2012-07-19 | Discharge: 2012-07-19 | Disposition: A | Discharge status: Home / Self Care | Payer: BC Managed Care – PPO | Source: Ambulatory Visit | Attending: Internal Medicine | Admitting: Internal Medicine

## 2012-07-19 ENCOUNTER — Other Ambulatory Visit: Payer: Self-pay | Admitting: Internal Medicine

## 2012-07-19 DIAGNOSIS — R0602 Shortness of breath: Secondary | ICD-10-CM

## 2012-07-19 DIAGNOSIS — R079 Chest pain, unspecified: Secondary | ICD-10-CM

## 2012-07-19 MED ORDER — IOHEXOL 350 MG/ML SOLN
125.0000 mL | Freq: Once | INTRAVENOUS | Status: AC | PRN
  Administered 2012-07-19: 125 mL via INTRAVENOUS

## 2012-07-22 ENCOUNTER — Ambulatory Visit (HOSPITAL_COMMUNITY)
Admission: RE | Admit: 2012-07-22 | Discharge: 2012-07-22 | Disposition: A | Discharge status: Home / Self Care | Payer: BC Managed Care – PPO | Source: Ambulatory Visit | Attending: Internal Medicine | Admitting: Internal Medicine

## 2012-07-22 ENCOUNTER — Other Ambulatory Visit (HOSPITAL_COMMUNITY): Payer: Self-pay | Admitting: Internal Medicine

## 2012-07-22 DIAGNOSIS — J189 Pneumonia, unspecified organism: Secondary | ICD-10-CM

## 2012-07-22 DIAGNOSIS — R059 Cough, unspecified: Secondary | ICD-10-CM | POA: Insufficient documentation

## 2012-07-22 DIAGNOSIS — R05 Cough: Secondary | ICD-10-CM | POA: Insufficient documentation

## 2012-07-22 DIAGNOSIS — R0602 Shortness of breath: Secondary | ICD-10-CM | POA: Insufficient documentation

## 2012-07-23 ENCOUNTER — Inpatient Hospital Stay (HOSPITAL_COMMUNITY): Payer: BC Managed Care – PPO

## 2012-07-23 ENCOUNTER — Encounter (HOSPITAL_COMMUNITY): Payer: Self-pay | Admitting: *Deleted

## 2012-07-23 ENCOUNTER — Inpatient Hospital Stay (HOSPITAL_COMMUNITY)
Admission: EM | Admit: 2012-07-23 | Discharge: 2012-07-29 | DRG: 541 | Disposition: A | Discharge status: Home / Self Care | Payer: BC Managed Care – PPO | Attending: Critical Care Medicine | Admitting: Critical Care Medicine

## 2012-07-23 DIAGNOSIS — R911 Solitary pulmonary nodule: Secondary | ICD-10-CM | POA: Diagnosis present

## 2012-07-23 DIAGNOSIS — I2699 Other pulmonary embolism without acute cor pulmonale: Secondary | ICD-10-CM

## 2012-07-23 DIAGNOSIS — E871 Hypo-osmolality and hyponatremia: Secondary | ICD-10-CM | POA: Diagnosis present

## 2012-07-23 DIAGNOSIS — L409 Psoriasis, unspecified: Secondary | ICD-10-CM | POA: Diagnosis present

## 2012-07-23 DIAGNOSIS — Z86718 Personal history of other venous thrombosis and embolism: Secondary | ICD-10-CM

## 2012-07-23 DIAGNOSIS — E785 Hyperlipidemia, unspecified: Secondary | ICD-10-CM | POA: Diagnosis present

## 2012-07-23 DIAGNOSIS — Z01812 Encounter for preprocedural laboratory examination: Secondary | ICD-10-CM

## 2012-07-23 DIAGNOSIS — R05 Cough: Secondary | ICD-10-CM

## 2012-07-23 DIAGNOSIS — R059 Cough, unspecified: Secondary | ICD-10-CM | POA: Diagnosis present

## 2012-07-23 DIAGNOSIS — J189 Pneumonia, unspecified organism: Principal | ICD-10-CM

## 2012-07-23 DIAGNOSIS — I10 Essential (primary) hypertension: Secondary | ICD-10-CM | POA: Diagnosis present

## 2012-07-23 DIAGNOSIS — T464X5A Adverse effect of angiotensin-converting-enzyme inhibitors, initial encounter: Secondary | ICD-10-CM

## 2012-07-23 DIAGNOSIS — E782 Mixed hyperlipidemia: Secondary | ICD-10-CM | POA: Diagnosis present

## 2012-07-23 DIAGNOSIS — L408 Other psoriasis: Secondary | ICD-10-CM | POA: Diagnosis present

## 2012-07-23 DIAGNOSIS — Z87891 Personal history of nicotine dependence: Secondary | ICD-10-CM

## 2012-07-23 DIAGNOSIS — J449 Chronic obstructive pulmonary disease, unspecified: Secondary | ICD-10-CM | POA: Diagnosis present

## 2012-07-23 DIAGNOSIS — R918 Other nonspecific abnormal finding of lung field: Secondary | ICD-10-CM

## 2012-07-23 DIAGNOSIS — J869 Pyothorax without fistula: Secondary | ICD-10-CM

## 2012-07-23 DIAGNOSIS — F17200 Nicotine dependence, unspecified, uncomplicated: Secondary | ICD-10-CM | POA: Diagnosis present

## 2012-07-23 DIAGNOSIS — Z79899 Other long term (current) drug therapy: Secondary | ICD-10-CM

## 2012-07-23 DIAGNOSIS — R0602 Shortness of breath: Secondary | ICD-10-CM

## 2012-07-23 DIAGNOSIS — J4489 Other specified chronic obstructive pulmonary disease: Secondary | ICD-10-CM

## 2012-07-23 DIAGNOSIS — T44905A Adverse effect of unspecified drugs primarily affecting the autonomic nervous system, initial encounter: Secondary | ICD-10-CM | POA: Diagnosis present

## 2012-07-23 DIAGNOSIS — Z86711 Personal history of pulmonary embolism: Secondary | ICD-10-CM | POA: Diagnosis present

## 2012-07-23 DIAGNOSIS — K219 Gastro-esophageal reflux disease without esophagitis: Secondary | ICD-10-CM | POA: Diagnosis present

## 2012-07-23 LAB — BODY FLUID CELL COUNT WITH DIFFERENTIAL
Eos, Fluid: 12 %
Lymphs, Fluid: 20 %
Monocyte-Macrophage-Serous Fluid: 5 % — ABNORMAL LOW (ref 50–90)
Total Nucleated Cell Count, Fluid: 1672 cu mm — ABNORMAL HIGH (ref 0–1000)

## 2012-07-23 LAB — CBC
MCHC: 35.5 g/dL (ref 30.0–36.0)
MCV: 92.7 fL (ref 78.0–100.0)
Platelets: 710 10*3/uL — ABNORMAL HIGH (ref 150–400)
RDW: 12.3 % (ref 11.5–15.5)
WBC: 23.3 10*3/uL — ABNORMAL HIGH (ref 4.0–10.5)

## 2012-07-23 LAB — COMPREHENSIVE METABOLIC PANEL
ALT: 103 U/L — ABNORMAL HIGH (ref 0–53)
AST: 51 U/L — ABNORMAL HIGH (ref 0–37)
Albumin: 2.4 g/dL — ABNORMAL LOW (ref 3.5–5.2)
Alkaline Phosphatase: 138 U/L — ABNORMAL HIGH (ref 39–117)
BUN: 24 mg/dL — ABNORMAL HIGH (ref 6–23)
CO2: 24 mEq/L (ref 19–32)
Calcium: 9.4 mg/dL (ref 8.4–10.5)
Chloride: 94 mEq/L — ABNORMAL LOW (ref 96–112)
Creatinine, Ser: 1.03 mg/dL (ref 0.50–1.35)
GFR calc Af Amer: >90 mL/min (ref >90)
GFR calc non Af Amer: 81 mL/min — ABNORMAL LOW (ref >90)
Glucose, Bld: 99 mg/dL (ref 70–99)
Potassium: 4.3 mEq/L (ref 3.5–5.1)
Sodium: 130 mEq/L — ABNORMAL LOW (ref 135–145)
Total Bilirubin: 0.4 mg/dL (ref 0.3–1.2)
Total Protein: 7.6 g/dL (ref 6.0–8.3)

## 2012-07-23 LAB — CBC WITH DIFFERENTIAL/PLATELET
Hemoglobin: 11.9 g/dL — ABNORMAL LOW (ref 13.0–17.0)
Lymphocytes Relative: 6 % — ABNORMAL LOW (ref 12–46)
Lymphs Abs: 1.2 10*3/uL (ref 0.7–4.0)
MCH: 33.1 pg (ref 26.0–34.0)
Monocytes Relative: 10 % (ref 3–12)
Neutro Abs: 18.4 10*3/uL — ABNORMAL HIGH (ref 1.7–7.7)
Neutrophils Relative %: 84 % — ABNORMAL HIGH (ref 43–77)
RBC: 3.59 MIL/uL — ABNORMAL LOW (ref 4.22–5.81)
WBC: 21.9 10*3/uL — ABNORMAL HIGH (ref 4.0–10.5)

## 2012-07-23 LAB — LACTATE DEHYDROGENASE: LDH: 194 U/L (ref 94–250)

## 2012-07-23 LAB — GRAM STAIN

## 2012-07-23 LAB — LACTATE DEHYDROGENASE, PLEURAL OR PERITONEAL FLUID: LD, Fluid: 1404 U/L — ABNORMAL HIGH (ref 3–23)

## 2012-07-23 LAB — CREATININE, SERUM: GFR calc Af Amer: >90 mL/min (ref >90)

## 2012-07-23 LAB — PROTEIN, TOTAL: Total Protein: 7.8 g/dL (ref 6.0–8.3)

## 2012-07-23 LAB — PROTEIN, BODY FLUID: Total protein, fluid: 5.2 g/dL

## 2012-07-23 LAB — PRO B NATRIURETIC PEPTIDE: Pro B Natriuretic peptide (BNP): 230.1 pg/mL — ABNORMAL HIGH (ref 0–125)

## 2012-07-23 LAB — EXPECTORATED SPUTUM ASSESSMENT W GRAM STAIN, RFLX TO RESP C

## 2012-07-23 MED ORDER — DEXTROSE 5 % IV SOLN
2.0000 g | Freq: Every day | INTRAVENOUS | Status: DC
  Administered 2012-07-23 – 2012-07-29 (×7): 2 g via INTRAVENOUS
  Filled 2012-07-23 (×7): qty 2

## 2012-07-23 MED ORDER — DEXTROSE-NACL 5-0.45 % IV SOLN
INTRAVENOUS | Status: DC
  Administered 2012-07-23: 13:00:00 via INTRAVENOUS
  Administered 2012-07-24: 50 mL/h via INTRAVENOUS
  Administered 2012-07-25: 06:00:00 via INTRAVENOUS

## 2012-07-23 MED ORDER — METOPROLOL TARTRATE 25 MG PO TABS
25.0000 mg | ORAL_TABLET | Freq: Every day | ORAL | Status: DC
Start: 2012-07-23 — End: 2012-07-29
  Administered 2012-07-24 – 2012-07-29 (×6): 25 mg via ORAL
  Filled 2012-07-23 (×7): qty 1

## 2012-07-23 MED ORDER — HYDROCODONE-ACETAMINOPHEN 5-325 MG PO TABS
2.0000 | ORAL_TABLET | Freq: Four times a day (QID) | ORAL | Status: DC | PRN
  Administered 2012-07-23 – 2012-07-29 (×16): 2 via ORAL
  Filled 2012-07-23 (×18): qty 2

## 2012-07-23 MED ORDER — DEXTROSE 5 % IV SOLN
500.0000 mg | INTRAVENOUS | Status: DC
  Administered 2012-07-23 – 2012-07-29 (×7): 500 mg via INTRAVENOUS
  Filled 2012-07-23 (×7): qty 500

## 2012-07-23 MED ORDER — IPRATROPIUM BROMIDE 0.02 % IN SOLN
0.5000 mg | Freq: Four times a day (QID) | RESPIRATORY_TRACT | Status: DC
  Administered 2012-07-23 – 2012-07-29 (×21): 0.5 mg via RESPIRATORY_TRACT
  Filled 2012-07-23 (×22): qty 2.5

## 2012-07-23 MED ORDER — SODIUM CHLORIDE 0.9 % IV SOLN: Freq: Once | INTRAVENOUS | Status: DC

## 2012-07-23 MED ORDER — ENOXAPARIN SODIUM 40 MG/0.4ML ~~LOC~~ SOLN
40.0000 mg | SUBCUTANEOUS | Status: DC
  Administered 2012-07-23 – 2012-07-29 (×6): 40 mg via SUBCUTANEOUS
  Filled 2012-07-23 (×7): qty 0.4

## 2012-07-23 MED ORDER — LORAZEPAM 1 MG PO TABS
2.0000 mg | ORAL_TABLET | Freq: Three times a day (TID) | ORAL | Status: DC
  Administered 2012-07-23 – 2012-07-29 (×19): 2 mg via ORAL
  Filled 2012-07-23 (×2): qty 2
  Filled 2012-07-23 (×2): qty 1
  Filled 2012-07-23 (×3): qty 2
  Filled 2012-07-23: qty 1
  Filled 2012-07-23 (×4): qty 2
  Filled 2012-07-23: qty 1
  Filled 2012-07-23 (×3): qty 2
  Filled 2012-07-23: qty 1
  Filled 2012-07-23: qty 2
  Filled 2012-07-23: qty 1
  Filled 2012-07-23 (×4): qty 2

## 2012-07-23 MED ORDER — ALBUTEROL SULFATE (5 MG/ML) 0.5% IN NEBU
2.5000 mg | INHALATION_SOLUTION | RESPIRATORY_TRACT | Status: DC | PRN
  Filled 2012-07-23 (×2): qty 0.5

## 2012-07-23 MED ORDER — BUDESONIDE 0.25 MG/2ML IN SUSP
0.2500 mg | Freq: Four times a day (QID) | RESPIRATORY_TRACT | Status: DC
  Administered 2012-07-23 – 2012-07-24 (×2): 0.25 mg via RESPIRATORY_TRACT
  Filled 2012-07-23 (×8): qty 2

## 2012-07-23 MED ORDER — ALBUTEROL SULFATE (5 MG/ML) 0.5% IN NEBU
2.5000 mg | INHALATION_SOLUTION | Freq: Four times a day (QID) | RESPIRATORY_TRACT | Status: DC
  Administered 2012-07-23 – 2012-07-29 (×21): 2.5 mg via RESPIRATORY_TRACT
  Filled 2012-07-23 (×20): qty 0.5

## 2012-07-23 NOTE — ED Notes (Signed)
Pt reports by treated for possible pna, then a pleural effusion. Sts his Dr is not sure what is causing everything. C/o worsening sob, chest pain. Was told by his Dr yesterday that he needed to be admitted to hospital. O2 sat 90% RA on arrival to ED acute room. Sts 98% yesterday. Placed on 3L O2, up to 96%.

## 2012-07-23 NOTE — ED Notes (Signed)
Onyeje, RN for rm 1310 notified of pt going to ultrasound for thoracentesis then x-ray. Cheryl from ultrasound has been notified where pt is going after procedure.

## 2012-07-23 NOTE — ED Provider Notes (Addendum)
History     CSN: [id number]  Arrival date & time 07/23/12  4098   First MD Initiated Contact with Patient 07/23/12 [phone]      Chief Complaint  Patient presents with  . Chest Pain  . Shortness of Breath    (Consider location/radiation/quality/duration/timing/severity/associated sxs/prior treatment) HPI  51 year old man with COPD, immune suppression from humira,  and a right sided pulmonary nodule first noted in 2011 who presents with shortness of breath and productive cough. These symptoms are acute on chronic and have been noticeably worsening since Friday. He was seen by his PCP, Dr. Lucky Cowboy and Loree Fee, PA, on Friday and a CT-A was ordered. This CT-A demonstrated a 5 cm right middle lobe mass, hilar lymphadenopathy, right-sided pleural effusion, and emphysema. He then followed up on Monday with his PCP who performed blood work. Last night he was called by the PCP's office and told this blood work was "all out of whack" and that he needs to come to the hospital. He decided to come this morning for evaluation instead. Other symptoms that he notes currently are fatigue and 10 pound unintentional weight loss over the last month. He denies hemoptysis and notes that he had a negative Tb skin test two weeks ago. He denies any swollen lymph nodes, nausea, vomiting, and diarrhea.   Past Medical History  Diagnosis Date  . Asthma     Present all life. Dx as child. Been in [facility] since 2003--pft 06/03/10 FEV1 3.14/ 82%, FEV1/FVC 0.54; insig resp to BD, DLCO 75%, ni lUNG  Vol  . Hyperlipidemia   . Hypertension     on lisinopril 2002/2003--changed ARB march 2011  . Palpitations     started atenolol since 2003. recollects hx of holter. 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 informed it "Not a dangerous condition"-changed to lopressor 12/15/2010  . Pulmonary embolism     does not recollect tpA or being on lot of o2 or hypotension. 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 Psoriasis     on enbre;l since 005. started in [place]. Stopped 2011 by [place] derm due to nodule on chest  . Detached retina   . Lung nodule     5mm on ct 11/10/10- likely lymph node  . History of TB skin testing     negative 2005-2010    Past Surgical History  Procedure Date  . Retinal detachment surgery     Family History  Problem Relation Age of Onset  . Emphysema Father   . Asthma Mother   . Allergies Mother     History  Substance Use Topics  . Smoking status: Former Smoker -- 1.0 packs/day for 35 years    Quit date: 03/25/2010  . Smokeless tobacco: Never Used  . Alcohol Use: Yes     every other day, 4-5drinks      Review of Systems  Constitutional: Positive for activity change, fatigue and unexpected weight change. Negative for fever.  HENT: Negative for facial swelling.   Eyes: Negative.   Respiratory: Positive for cough.   Cardiovascular: Positive for chest pain.  Gastrointestinal: Negative for abdominal distention.  Genitourinary: Negative.   Musculoskeletal: Negative.   Neurological: Negative.   Hematological: Negative.   Psychiatric/Behavioral: Negative.     Allergies  Ace inhibitors and Haloperidol lactate  Home Medications   No current outpatient prescriptions on file.  BP 124/79  Pulse 89  Temp 99 F (37.2 C) (  Oral)  Resp 18  Ht 6\' 1"  (1.854 m)  Wt 210 lb (95.255 kg)  BMI 27.71 kg/m2  SpO2 93%  Physical Exam  Constitutional: He is oriented to person, place, and time. He appears well-developed and well-nourished. No distress.  HENT:  Head: Normocephalic and atraumatic.  Eyes: Right pupil is round and reactive. Left pupil is not reactive. Pupils are unequal.  Neck: Normal range of motion. Neck supple.  Cardiovascular: Normal rate, regular rhythm and normal heart sounds.   Pulmonary/Chest: No accessory muscle usage. No respiratory distress. He has decreased breath sounds in the right middle field. He has no wheezes.  He has no rhonchi. He has rales in the right lower field and the left lower field.       Patient using supplementary O2 during the exam  Abdominal: Soft. Bowel sounds are normal. He exhibits no distension.  Musculoskeletal: Normal range of motion.  Neurological: He is alert and oriented to person, place, and time.  Skin: He is not diaphoretic.    ED Course  Procedures (including critical care time)  Labs Reviewed  CBC WITH DIFFERENTIAL - Abnormal; Notable for the following:    WBC 21.9 (*)     RBC 3.59 (*)     Hemoglobin 11.9 (*)     HCT 33.3 (*)     Platelets 699 (*)     Neutrophils Relative 84 (*)     Neutro Abs 18.4 (*)     Lymphocytes Relative 6 (*)     Monocytes Absolute 2.2 (*)     All other components within normal limits  COMPREHENSIVE METABOLIC PANEL - Abnormal; Notable for the following:    Sodium 130 (*)     Chloride 94 (*)     BUN 24 (*)     Albumin 2.4 (*)     AST 51 (*)     ALT 103 (*)     Alkaline Phosphatase 138 (*)     GFR calc non Af Amer 81 (*)     All other components within normal limits  SEDIMENTATION RATE - Abnormal; Notable for the following:    Sed Rate 131 (*)     All other components within normal limits  PRO B NATRIURETIC PEPTIDE - Abnormal; Notable for the following:    Pro B Natriuretic peptide (BNP) 230.1 (*)     All other components within normal limits  CBC - Abnormal; Notable for the following:    WBC 23.3 (*)     RBC 3.68 (*)     Hemoglobin 12.1 (*)     HCT 34.1 (*)     Platelets 710 (*)     All other components within normal limits  LACTATE DEHYDROGENASE, BODY FLUID - Abnormal; Notable for the following:    LD, Fluid 1404 (*)     All other components within normal limits  BODY FLUID CELL COUNT WITH DIFFERENTIAL - Abnormal; Notable for the following:    Appearance, Fluid CLOUDY (*)     WBC, Fluid 1672 (*)     Neutrophil Count, Fluid 63 (*)     Monocyte-Macrophage-Serous Fluid 5 (*)     All other components within normal limits    STREP PNEUMONIAE URINARY ANTIGEN  CREATININE, SERUM  CULTURE, EXPECTORATED SPUTUM-ASSESSMENT  PROTEIN, BODY FLUID  GRAM STAIN  LACTATE DEHYDROGENASE  PROTEIN, TOTAL  HIV ANTIBODY (ROUTINE TESTING)  LEGIONELLA ANTIGEN, URINE  GRAM STAIN  CYTOLOGY - NON PAP  BODY FLUID CULTURE  PH, BODY FLUID  BASIC  METABOLIC PANEL  CBC WITH DIFFERENTIAL  CULTURE, RESPIRATORY   07/19/12 CT-Angiogram Chest CT ANGIOGRAPHY CHEST  IMPRESSION:  1. No evidence of pulmonary embolism.  2. 5 cm irregular confluent opacity in the right midlung involving  both right upper and lower lobes. This is suspicious for  bronchogenic carcinoma, although differential diagnosis also  includes pneumonia. Consider bronchoscopy for further evaluation.  3. Shotty right hilar and mediastinal lymph nodes.  4. Small right pleural effusion.  5. Moderate pulmonary emphysema.  6. Consultation with the [facility] [id number]) should be considered.   Dg Chest 1 View  07/23/2012  *RADIOLOGY REPORT*  Clinical Data: Status post right-sided thoracentesis  CHEST - 1 VIEW  Comparison: 07/22/2012  Findings: Heart size is stable.  Opacity within the right midlung is unchanged from previous exam. There is a moderate sized loculated right pleural effusion which is also stable.  Left lung is clear.  No pneumothorax after thoracentesis identified.  IMPRESSION:  1.  No pneumothorax identified after right-sided thoracentesis.  Original Report Authenticated By: Rosealee Albee, M.D.   Dg Chest 2 View  07/22/2012  **ADDENDUM** CREATED: 07/22/2012 15:07:47  Addendum by Dr. Christiana Pellant on 07/22/2012.  I was asked to review the chest radiograph to determine whether the right pleural effusion could be accessible for thoracentesis.  This could be potentially performed with sonographic guidance.  Given that the patient is currently being treated for possible pneumonia, alternatively, follow-up PA and lateral  chest radiographs in 6 weeks could be obtained and further action determined at that time based on the findings on the follow-up exam, as long as there is no clinical worsening prompting more urgent management in the meantime.  **END ADDENDUM** SIGNED BY: Harrel Lemon, M.D.   07/22/2012  *RADIOLOGY REPORT*  Clinical Data: Pneumonia, cough, shortness of breath.  CHEST - 2 VIEW  Comparison: Chest CT 07/19/2012.  Findings: Ill-defined airspace opacity again noted in the right mid lung.  This is likely similar to prior study.  Right pleural effusion again noted.  Increasing density medially within the right hemithorax along the right heart border may represent increasing size of a loculated right effusion.  Left lung is clear.  No acute bony abnormality.  IMPRESSION: Stable ill-defined opacity in the right mid lung.  Suspect enlarging loculated right effusion medially within the right lower hemithorax.  Original Report Authenticated By: Harrel Lemon, M.D.   US Thoracentesis Asp Pleural Space W/img Guide  07/23/2012  *RADIOLOGY REPORT*  Clinical Data:  Patient with history of smoking, pneumonia, prior pulmonary embolism, right pleural effusion; request is made for diagnostic and therapeutic right thoracentesis.  ULTRASOUND GUIDED DIAGNOSTIC RIGHT   THORACENTESIS  An ultrasound guided thoracentesis was thoroughly discussed with the patient and questions answered.  The benefits, risks, alternatives and complications were also discussed.  The patient understands and wishes to proceed with the procedure.  Written consent was obtained.  Ultrasound was performed to localize and mark an adequate pocket of fluid in the right  chest.  The area was then prepped and draped in the normal sterile fashion.  1% Lidocaine was used for local anesthesia.  Under ultrasound guidance a 19 gauge Yueh catheter was introduced.  Thoracentesis was performed.  The catheter was removed and a dressing applied.  Complications:  none   Findings: A total of approximately 110 cc's of slightly turbid, yellow fluid was removed. The fluid sample was  sent for laboratory analysis. Secondary to the extensive multiloculated nature  of the right pleural effusion , only the above amount of fluid could be removed at this time.  IMPRESSION: Successful ultrasound guided diagnostic  right  thoracentesis yielding 110 cc's of pleural fluid.  Read by: Jeananne Rama, P.A.-C  Original Report Authenticated By: Reola Calkins, M.D.   ECG: rate 89, normal sinus rhythm, normal intervals, no changes since previous ECG    1. Postobstructive pneumonia   2. Asthma with chronic obstructive pulmonary disease (COPD)       MDM  Mr. Huster presented with hypoxemia and worsening cough in the setting of a recent CT that demonstrated a 5 cm mass in his right lung and right sided pleural effusion. He was evaluated by the pulmonology team and determined to be a candidate for admission.

## 2012-07-23 NOTE — H&P (Signed)
Name: Samuel Sharp MRN: [id number] DOB: August 10, 1961    LOS: 0  Referring Provider:  EDP Reason for Referral:  SOB  PULMONARY / CRITICAL CARE MEDICINE  HPI:  51 yo wm former smoker(uses electronic cigarette now), hx of PE off coumadin, psoriasis uses humira but off x 1 month for recent URI, presents with increase SOB, clear sputum. Ct chest reveals finding consistent with pna and or bronchogenic cancer. PCCM will admit with plan to have IR tap rt effusion and FOB for further clarification and diagosis of rt lung process. He will be tx with aggressive abx due to recent immunosuppressive treatment.  Past Medical History  Diagnosis Date  . Asthma     Present all life. Dx as child. Been in [facility] since 2003--pft 06/03/10 FEV1 3.14/ 82%, FEV1/FVC 0.54; insig resp to BD, DLCO 75%, ni lUNG  Vol  . Hyperlipidemia   . Hypertension     on lisinopril 2002/2003--changed ARB march 2011  . Palpitations     started atenolol since 2003. recollects hx of holter. 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 informed it "Not a dangerous condition"-changed to lopressor 12/15/2010  . Pulmonary embolism     does not recollect tpA or being on lot of o2 or hypotension. 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 Psoriasis     on enbre;l since 005. started in [place]. Stopped 2011 by [place] derm due to nodule on chest  . Detached retina   . Lung nodule     5mm on ct 11/10/10- likely lymph node  . History of TB skin testing     negative 2005-2010   Past Surgical History  Procedure Date  . Retinal detachment surgery    Prior to Admission medications   Medication Sig Start Date End Date Taking? Authorizing Provider  atorvastatin (LIPITOR) 20 MG tablet Take 10 mg by mouth daily.    Yes Historical Provider, MD  enalapril (VASOTEC) 20 MG tablet Take 20 mg by mouth daily.   Yes Historical Provider, MD  Flaxseed, Linseed, (EQL FLAX SEED OIL) 1000 MG CAPS Take 1,000 mg by mouth daily.   Yes  Historical Provider, MD  Fluticasone-Salmeterol (ADVAIR DISKUS) 250-50 MCG/DOSE AEPB Inhale 1 puff into the lungs every 12 (twelve) hours.     Yes Historical Provider, MD  HYDROcodone-acetaminophen (VICODIN) 5-500 MG per tablet Take 1 tablet by mouth every 6 (six) hours as needed. pain   Yes Historical Provider, MD  hydroxypropyl methylcellulose (ISOPTO TEARS) 2.5 % ophthalmic solution Place 1 drop into both eyes 3 (three) times daily as needed. Dry eyes   Yes Historical Provider, MD  LORazepam (ATIVAN) 2 MG tablet Take 2 mg by mouth 3 (three) times daily. 1 tablet 3 times a day   Yes Historical Provider, MD  metoprolol (LOPRESSOR) 50 MG tablet 1/2 tablet once a day    Yes Historical Provider, MD  Multiple Vitamins-Minerals (MULTIVITAMIN WITH MINERALS) tablet Take 1 tablet by mouth daily.   Yes Historical Provider, MD  Omega-3 Fatty Acids (FISH OIL) 1000 MG CAPS Take 1,000 mg by mouth daily.   Yes Historical Provider, MD   Allergies Allergies  Allergen Reactions  . Ace Inhibitors     cough  . Haloperidol Lactate     REACTION: phsycotic break    Family History Family History  Problem Relation Age of Onset  . Emphysema Father   . Asthma Mother   . Allergies Mother    Social History  reports that he quit smoking about 2 years ago. He does not have any smokeless tobacco history on file. He reports that he drinks alcohol. He reports that he does not use illicit drugs.  Review Of Systems:  Taken extensively see hpi.   Brief patient description:  51 yo WM NAD at rest.  Events Since Admission:   Current Status: Non critical   Vital Signs: Temp:  [97.8 F (36.6 C)] 97.8 F (36.6 C) (07/30 0750) Pulse Rate:  [73-88] 75  (07/30 1000) Resp:  [14-24] 20  (07/30 1000) BP: (126-152)/(86-95) 138/87 mmHg (07/30 1000) SpO2:  [90 %-98 %] 98 % (07/30 1000)  Physical Examination: General: [id number] Neuro:  Intact HEENT:  o andeopathy Neck:  No Jvd Cardiovascular:  hsr rrr Lungs:   Expiratory wheeze. Abdomen:  +bs Musculoskeletal:  intact Skin:  Psoriasis Bilateral knees  Principal Problem:  *Postobstructive pneumonia Active Problems:  HYPERLIPIDEMIA  TOBACCO ABUSE  HYPERTENSION  PULMONARY EMBOLISM  PULMONARY NODULE, RIGHT LOWER LOBE  Asthma with chronic obstructive pulmonary disease (COPD)  Cough due to angiotensin-converting enzyme inhibitor  Psoriasis   ASSESSMENT AND PLAN  PULMONARY No results found for this basename: PHART:5,PCO2:5,PCO2ART:5,PO2ART:5,HCO3:5,O2SAT:5 in the last 168 hours    Dg Chest 2 View  07/22/2012  **ADDENDUM** CREATED: 07/22/2012 15:07:47  Addendum by Dr. Christiana Pellant on 07/22/2012.  I was asked to review the chest radiograph to determine whether the right pleural effusion could be accessible for thoracentesis.  This could be potentially performed with sonographic guidance.  Given that the patient is currently being treated for possible pneumonia, alternatively, follow-up PA and lateral chest radiographs in 6 weeks could be obtained and further action determined at that time based on the findings on the follow-up exam, as long as there is no clinical worsening prompting more urgent management in the meantime.  **END ADDENDUM** SIGNED BY: Harrel Lemon, M.D.   07/22/2012  *RADIOLOGY REPORT*  Clinical Data: Pneumonia, cough, shortness of breath.  CHEST - 2 VIEW  Comparison: Chest CT 07/19/2012.  Findings: Ill-defined airspace opacity again noted in the right mid lung.  This is likely similar to prior study.  Right pleural effusion again noted.  Increasing density medially within the right hemithorax along the right heart border may represent increasing size of a loculated right effusion.  Left lung is clear.  No acute bony abnormality.  IMPRESSION: Stable ill-defined opacity in the right mid lung.  Suspect enlarging loculated right effusion medially within the right lower hemithorax.  Original Report Authenticated By: Harrel Lemon,  M.D.     A:  New rt confluence with questionable pna and or bronchogenic ca.   P:   Admit to floor IR guided thoracentesis for dx. FOB in near future Empiric pna tx of immunocompromised pt. BD for underlying copd  CARDIOVASCULAR No results found for this basename: TROPONINI:5,LATICACIDVEN:5, O2SATVEN:5,PROBNP:5 in the last 168 hours    A: HTN P:  Continue antihypertensives D/c ACE inhibitor  RENAL  Lab 07/23/12 0805  NA 130*  K 4.3  CL 94*  CO2 24  BUN 24*  CREATININE 1.03  CALCIUM 9.4  MG --  PHOS --   Intake/Output    None      A:  No Acute Issue  P:     GASTROINTESTINAL  Lab 07/23/12 0805  AST 51*  ALT 103*  ALKPHOS 138*  BILITOT 0.4  PROT 7.6  ALBUMIN 2.4*    A:  GERD. Mild elevation in LFTs P:   PPI  F/u LFTs   HEMATOLOGIC  Lab 07/23/12 0805  HGB 11.9*  HCT 33.3*  PLT 699*  INR --  APTT --   A: No Acute Issue  P:    INFECTIOUS  Lab 07/23/12 0805  WBC 21.9*  PROCALCITON --   Cultures: 7/30 bc x 2>> 7/30 sputum>> 7/39 uc>> Antibiotics: 7/30 roc>> 7/30 zithro>>  A:  CAP  ? Postobstructive pna, parapneumonic effusion. ?malignant P:   See flow May need fob Tap effusion and culture/cytology  ENDOCRINE No results found for this basename: GLUCAP:5 in the last 168 hours A:  Hx of humira use P:   monitor  NEUROLOGIC  A:  No Acute Issue  P:     BEST PRACTICE / DISPOSITION Level of Care:  floor Primary Service:  pccm Consultants:  none Code Status:  full Diet:  regular DVT Px:  lmwh GI Px:  ppi Skin Integrity:  intact Social / Family:  No family at bedside  [facility] ACNP Adolph Pollack PCCM Pager [phone] till 3 pm If no answer page [phone]  Pt seen and examined with S Minor and agree with above note.  Shan Levans Beeper  [phone]  Cell  [phone]  If no response or cell goes to voicemail, call beeper [phone]  07/23/2012, 10:35 AM

## 2012-07-23 NOTE — Procedures (Signed)
US guided diagnostic right thoracentesis performed yielding 110 cc's slightly turbid, yellow fluid. The fluid was sent to the lab for preordered studies. F/u CXR pending. No immediate complications. Only the above amount of fluid could be aspirated at this time secondary to the extensive multiloculated nature of the effusion.Samuel Sharp

## 2012-07-24 ENCOUNTER — Inpatient Hospital Stay (HOSPITAL_COMMUNITY): Payer: BC Managed Care – PPO

## 2012-07-24 DIAGNOSIS — J869 Pyothorax without fistula: Secondary | ICD-10-CM

## 2012-07-24 DIAGNOSIS — T44905A Adverse effect of unspecified drugs primarily affecting the autonomic nervous system, initial encounter: Secondary | ICD-10-CM

## 2012-07-24 DIAGNOSIS — R05 Cough: Secondary | ICD-10-CM

## 2012-07-24 LAB — CBC WITH DIFFERENTIAL/PLATELET
Eosinophils Absolute: 0.4 10*3/uL (ref 0.0–0.7)
Hemoglobin: 12.7 g/dL — ABNORMAL LOW (ref 13.0–17.0)
Lymphocytes Relative: 13 % (ref 12–46)
Lymphs Abs: 2.3 10*3/uL (ref 0.7–4.0)
MCH: 32.1 pg (ref 26.0–34.0)
Neutro Abs: 12.7 10*3/uL — ABNORMAL HIGH (ref 1.7–7.7)
Neutrophils Relative %: 74 % (ref 43–77)
Platelets: 732 10*3/uL — ABNORMAL HIGH (ref 150–400)
RBC: 3.96 MIL/uL — ABNORMAL LOW (ref 4.22–5.81)
WBC: 17.1 10*3/uL — ABNORMAL HIGH (ref 4.0–10.5)

## 2012-07-24 LAB — PH, BODY FLUID: pH, Fluid: 6.5

## 2012-07-24 LAB — BASIC METABOLIC PANEL
Calcium: 9.3 mg/dL (ref 8.4–10.5)
GFR calc Af Amer: >90 mL/min (ref >90)
GFR calc non Af Amer: 83 mL/min — ABNORMAL LOW (ref >90)
Potassium: 4.5 mEq/L (ref 3.5–5.1)
Sodium: 128 mEq/L — ABNORMAL LOW (ref 135–145)

## 2012-07-24 LAB — LEGIONELLA ANTIGEN, URINE: Legionella Antigen, Urine: NEGATIVE

## 2012-07-24 MED ORDER — TRAMADOL HCL 50 MG PO TABS
50.0000 mg | ORAL_TABLET | Freq: Four times a day (QID) | ORAL | Status: DC | PRN
  Administered 2012-07-24: 50 mg via ORAL
  Filled 2012-07-24 (×2): qty 1

## 2012-07-24 MED ORDER — PANTOPRAZOLE SODIUM 40 MG PO TBEC
40.0000 mg | DELAYED_RELEASE_TABLET | Freq: Two times a day (BID) | ORAL | Status: DC
  Administered 2012-07-24 – 2012-07-29 (×10): 40 mg via ORAL
  Filled 2012-07-24 (×11): qty 1

## 2012-07-24 MED ORDER — TRAMADOL HCL 50 MG PO TABS
50.0000 mg | ORAL_TABLET | Freq: Four times a day (QID) | ORAL | Status: DC
  Administered 2012-07-24 – 2012-07-29 (×19): 50 mg via ORAL
  Filled 2012-07-24 (×29): qty 1

## 2012-07-24 MED ORDER — HYDROCOD POLST-CHLORPHEN POLST 10-8 MG/5ML PO LQCR
5.0000 mL | Freq: Two times a day (BID) | ORAL | Status: DC | PRN
  Administered 2012-07-24 – 2012-07-29 (×7): 5 mL via ORAL
  Filled 2012-07-24 (×8): qty 5

## 2012-07-24 MED ORDER — TRAMADOL HCL 50 MG PO TABS: 100.0000 mg | ORAL_TABLET | Freq: Four times a day (QID) | ORAL | Status: DC

## 2012-07-24 NOTE — Progress Notes (Addendum)
Name: Samuel Sharp MRN: [id number] DOB: 11/20/1961    LOS: 1  Referring Provider:  EDP Reason for Referral:  SOB  PULMONARY / CRITICAL CARE MEDICINE  HPI:  51 yo wm former smoker(uses electronic cigarette now), hx of PE off coumadin, psoriasis uses humira but off x 1 month for recent URI, presents with increase SOB, clear sputum. Ct chest reveals finding consistent with pna and or bronchogenic cancer. PCCM will admit with plan to have IR tap rt effusion and FOB for further clarification and diagosis of rt lung process. He will be tx with aggressive abx due to recent immunosuppressive treatment.       Events Since Admission: 7/30 rt Thora with 110 cc yellow fluid > c/w empyema   Current Status: Tol RA, persistent R ant and lat cp worse with cough  Vital Signs: Temp:  [97.8 F (36.6 C)-99 F (37.2 C)] 98.7 F (37.1 C) (07/31 0605) Pulse Rate:  [75-93] 93  (07/31 1004) Resp:  [15-24] 17  (07/31 0605) BP: (100-139)/(64-90) 139/83 mmHg (07/31 1004) SpO2:  [91 %-100 %] 94 % (07/31 0810) Weight:  [210 lb (95.255 kg)] 210 lb (95.255 kg) (07/30 1415)  Physical Examination: General: WNWDWMNAD. Reports feeling weak, co cough with yellow sputum/ Neuro:  Intact HEENT: No andeopathy Neck:  No Jvd Cardiovascular:  hsr rrr Lungs:  Expiratory wheeze. Abdomen:  +bs Musculoskeletal:  intact Skin:  Psoriasis Bilateral knees  Principal Problem:  *Postobstructive pneumonia Active Problems:  HYPERLIPIDEMIA  TOBACCO ABUSE  HYPERTENSION  PULMONARY EMBOLISM  PULMONARY NODULE, RIGHT LOWER LOBE  Asthma with chronic obstructive pulmonary disease (COPD)  Cough due to angiotensin-converting enzyme inhibitor  Psoriasis   ASSESSMENT AND PLAN  PULMONARY No results found for this basename: PHART:5,PCO2:5,PCO2ART:5,PO2ART:5,HCO3:5,O2SAT:5 in the last 168 hours    Dg Chest 1 View  07/23/2012  *RADIOLOGY REPORT*  Clinical Data: Status post right-sided thoracentesis  CHEST - 1 VIEW  Comparison:  07/22/2012  Findings: Heart size is stable.  Opacity within the right midlung is unchanged from previous exam. There is a moderate sized loculated right pleural effusion which is also stable.  Left lung is clear.  No pneumothorax after thoracentesis identified.  IMPRESSION:  1.  No pneumothorax identified after right-sided thoracentesis.  Original Report Authenticated By: Rosealee Albee, M.D.   Dg Chest 2 View  07/24/2012  *RADIOLOGY REPORT*  Clinical Data: Post thoracentesis.  Shortness of breath. Pneumonia.  CHEST - 2 VIEW  Comparison: 07/22/2012  Findings: Ill-defined right mid lung airspace opacity again noted, not significantly changed.  Medial density in the right lower hemithorax again noted, likely loculated effusion, stable or slightly increased since prior study.  No pneumothorax following thoracentesis.  Left lung is clear.  Heart is normal size.  IMPRESSION: Stable right midlung consolidation/airspace opacity.  Stable or slightly worsened density in the medial right hemithorax, likely loculated effusion.  Original Report Authenticated By: Cyndie Chime, M.D.   Dg Chest 2 View  07/22/2012  **ADDENDUM** CREATED: 07/22/2012 15:07:47  Addendum by Dr. Christiana Pellant on 07/22/2012.  I was asked to review the chest radiograph to determine whether the right pleural effusion could be accessible for thoracentesis.  This could be potentially performed with sonographic guidance.  Given that the patient is currently being treated for possible pneumonia, alternatively, follow-up PA and lateral chest radiographs in 6 weeks could be obtained and further action determined at that time based on the findings on the follow-up exam, as long as there is no clinical worsening prompting more  urgent management in the meantime.  **END ADDENDUM** SIGNED BY: Harrel Lemon, M.D.   07/22/2012  *RADIOLOGY REPORT*  Clinical Data: Pneumonia, cough, shortness of breath.  CHEST - 2 VIEW  Comparison: Chest CT 07/19/2012.   Findings: Ill-defined airspace opacity again noted in the right mid lung.  This is likely similar to prior study.  Right pleural effusion again noted.  Increasing density medially within the right hemithorax along the right heart border may represent increasing size of a loculated right effusion.  Left lung is clear.  No acute bony abnormality.  IMPRESSION: Stable ill-defined opacity in the right mid lung.  Suspect enlarging loculated right effusion medially within the right lower hemithorax.  Original Report Authenticated By: Harrel Lemon, M.D.   US Thoracentesis Asp Pleural Space W/img Guide  07/23/2012  *RADIOLOGY REPORT*  Clinical Data:  Patient with history of smoking, pneumonia, prior pulmonary embolism, right pleural effusion; request is made for diagnostic and therapeutic right thoracentesis.  ULTRASOUND GUIDED DIAGNOSTIC RIGHT   THORACENTESIS  An ultrasound guided thoracentesis was thoroughly discussed with the patient and questions answered.  The benefits, risks, alternatives and complications were also discussed.  The patient understands and wishes to proceed with the procedure.  Written consent was obtained.  Ultrasound was performed to localize and mark an adequate pocket of fluid in the right  chest.  The area was then prepped and draped in the normal sterile fashion.  1% Lidocaine was used for local anesthesia.  Under ultrasound guidance a 19 gauge Yueh catheter was introduced.  Thoracentesis was performed.  The catheter was removed and a dressing applied.  Complications:  none  Findings: A total of approximately 110 cc's of slightly turbid, yellow fluid was removed. The fluid sample was  sent for laboratory analysis. Secondary to the extensive multiloculated nature of the right pleural effusion , only the above amount of fluid could be removed at this time.  IMPRESSION: Successful ultrasound guided diagnostic  right  thoracentesis yielding 110 cc's of pleural fluid.  Read by: Jeananne Rama,  P.A.-C  Original Report Authenticated By: Reola Calkins, M.D.     A:  New rt confluence with questionable pna and or bronchogenic ca.   P:   Admit to floor IR guided thoracentesis for dx. 7/30 see ID Empiric pna tx of immunocompromised pt.see ID BD for underlying copd  CARDIOVASCULAR  Lab 07/23/12 0805  TROPONINI --  LATICACIDVEN --  PROBNP 230.1*      A: HTN P:  Continue antihypertensives D/cd ACE inhibitor  RENAL  Lab 07/24/12 0320 07/23/12 1145 07/23/12 0805  NA 128* -- 130*  K 4.5 -- 4.3  CL 91* -- 94*  CO2 27 -- 24  BUN 20 -- 24*  CREATININE 1.01 0.99 1.03  CALCIUM 9.3 -- 9.4  MG -- -- --  PHOS -- -- --   Intake/Output      07/30 0701 - 07/31 0700 07/31 0701 - 08/01 0700   P.O. 840 240   I.V. (mL/kg) 1122 (11.8)    Total Intake(mL/kg) 1962 (20.6) 240 (2.5)   Net +1962 +240        Urine Occurrence 3 x 1 x   Stool Occurrence        A:  No Acute Issue  P:     GASTROINTESTINAL  Lab 07/23/12 1145 07/23/12 0805  AST -- 51*  ALT -- 103*  ALKPHOS -- 138*  BILITOT -- 0.4  PROT 7.8 7.6  ALBUMIN -- 2.4*  A:  GERD. Mild elevation in LFTs P:   PPI F/u LFTs   HEMATOLOGIC  Lab 07/24/12 0320 07/23/12 1145 07/23/12 0805  HGB 12.7* 12.1* 11.9*  HCT 37.1* 34.1* 33.3*  PLT 732* 710* 699*  INR -- -- --  APTT -- -- --   A: No Acute Issue  P:    INFECTIOUS  Lab 07/24/12 0320 07/23/12 1145 07/23/12 0805  WBC 17.1* 23.3* 21.9*  PROCALCITON -- -- --   Cultures: 7/30 bc x 2>> 7/30 sputum>> 7/30 uc>> 7/30 pleural >>  Wbc' s, no org seen  Antibiotics: 7/30 roc>> 7/30 zithro>>  A:  CAP  ? Postobstructive pna, parapneumonic effusion. ?malignant P:   See flow  Tapped  Effusion 7/30 > c/w empyema Treat cough and pain  ENDOCRINE No results found for this basename: GLUCAP:5 in the last 168 hours A:  Hx of humira use P:   monitor  NEUROLOGIC  A:  No Acute Issue  P:     BEST PRACTICE / DISPOSITION Level of Care:   floor Primary Service:  pccm Consultants:  none Code Status:  full Diet:  regular DVT Px:  lmwh GI Px:  ppi added 7/31 due to cough Skin Integrity:  intact Social / Family:  No family at bedside  [facility] Minor ACNP Adolph Pollack PCCM Pager [phone] till 3 pm If no answer page [phone]  Pt independently  seen and examined and available cxr's reviewed and I agree with above findings/ imp/ will need T surgery eval 8/1 for ? Early VATS ?  Sandrea Hughs, MD Pulmonary and Critical Care Medicine [facility] Cell [phone]

## 2012-07-24 NOTE — Progress Notes (Signed)
UR done. 

## 2012-07-25 ENCOUNTER — Inpatient Hospital Stay (HOSPITAL_COMMUNITY): Payer: BC Managed Care – PPO

## 2012-07-25 DIAGNOSIS — J869 Pyothorax without fistula: Secondary | ICD-10-CM

## 2012-07-25 LAB — BASIC METABOLIC PANEL
CO2: 27 mEq/L (ref 19–32)
Calcium: 8.8 mg/dL (ref 8.4–10.5)
Creatinine, Ser: 1.07 mg/dL (ref 0.50–1.35)
GFR calc non Af Amer: 77 mL/min — ABNORMAL LOW (ref >90)

## 2012-07-25 NOTE — Progress Notes (Signed)
Name: Samuel Sharp MRN: [id number] DOB: [birth year]    LOS: 2  Referring Provider:  EDP Reason for Referral:  SOB  PULMONARY / CRITICAL CARE MEDICINE  HPI:  51 yo wm former smoker(uses electronic cigarette now), hx of PE off coumadin, psoriasis uses humira but off x 1 month for recent URI,  admitted 7/30  with  SOB, pleuritic cp,  clear sputum. Ct chest c/w  consistent with pna and loculated effusion on R with no real change in the R sided spn.     Events Since Admission: 7/30 rt Thora with 110 cc yellow fluid > c/w empyema 8/1 cvts consult>>   Current Status: Tol RA, persistent R ant and lat cp worse with cough  Vital Signs: Temp:  [98.1 F (36.7 C)-101.7 F (38.7 C)] 98.1 F (36.7 C) (08/01 0600) Pulse Rate:  [85-98] 85  (08/01 0600) Resp:  [18] 18  (08/01 0600) BP: (119-132)/(75-86) 132/86 mmHg (08/01 0600) SpO2:  [89 %-97 %] 97 % (08/01 0811) FiO2 (%):  [21 %] 21 % (07/31 1352)  Physical Examination: General: WNWDWMNAD. Reports feeling weak, co cough with yellow sputum. Spiked fever 8/1 thru abx Neuro:  Intact HEENT: No andeopathy Neck:  No Jvd Cardiovascular:  hsr rrr Lungs:  Expiratory wheeze. Abdomen:  +bs Musculoskeletal:  intact Skin:  Psoriasis Bilateral knees  Principal Problem:  *Postobstructive pneumonia Active Problems:  HYPERLIPIDEMIA  TOBACCO ABUSE  HYPERTENSION  PULMONARY EMBOLISM  PULMONARY NODULE, RIGHT LOWER LOBE  Asthma with chronic obstructive pulmonary disease (COPD)  Cough due to angiotensin-converting enzyme inhibitor  Psoriasis  Empyema  Pneumonia, organism unspecified   ASSESSMENT AND PLAN  PULMONARY No results found for this basename: PHART:5,PCO2:5,PCO2ART:5,PO2ART:5,HCO3:5,O2SAT:5 in the last 168 hours  Vent Mode:  [-]  FiO2 (%):  [21 %] 21 % Dg Chest 1 View  07/23/2012  *RADIOLOGY REPORT*  Clinical Data: Status post right-sided thoracentesis  CHEST - 1 VIEW  Comparison: 07/22/2012  Findings: Heart size is stable.  Opacity  within the right midlung is unchanged from previous exam. There is a moderate sized loculated right pleural effusion which is also stable.  Left lung is clear.  No pneumothorax after thoracentesis identified.  IMPRESSION:  1.  No pneumothorax identified after right-sided thoracentesis.  Original Report Authenticated By: Rosealee Albee, M.D.   Dg Chest 2 View  07/25/2012  *RADIOLOGY REPORT*  Clinical Data: Thoracentesis 07/24/2012, right chest pain, cough, congestion  CHEST - 2 VIEW  Comparison: 07/24/2012, 07/19/2012  Findings: Irregular right midlung consolidation/opacity again evident.  No significant change.  Medial density along the right lower hemithorax and along the minor fissure correlates with a loculated right effusion by CT comparison.  No pneumothorax or significant interval change.  Background emphysema noted.  Stable left lung aeration.  No left effusion.  Stable heart size and vascularity.  Trachea is midline.  IMPRESSION: Stable right midlung consolidation/airspace process.  Persistent loculated right pleural effusion.  No significant interval change.  Original Report Authenticated By: Judie Petit. Ruel Favors, M.D.   Dg Chest 2 View  07/24/2012  *RADIOLOGY REPORT*  Clinical Data: Post thoracentesis.  Shortness of breath. Pneumonia.  CHEST - 2 VIEW  Comparison: 07/22/2012  Findings: Ill-defined right mid lung airspace opacity again noted, not significantly changed.  Medial density in the right lower hemithorax again noted, likely loculated effusion, stable or slightly increased since prior study.  No pneumothorax following thoracentesis.  Left lung is clear.  Heart is normal size.  IMPRESSION: Stable right midlung consolidation/airspace opacity.  Stable  or slightly worsened density in the medial right hemithorax, likely loculated effusion.  Original Report Authenticated By: Cyndie Chime, M.D.   US Thoracentesis Asp Pleural Space W/img Guide  07/23/2012  *RADIOLOGY REPORT*  Clinical Data:  Patient  with history of smoking, pneumonia, prior pulmonary embolism, right pleural effusion; request is made for diagnostic and therapeutic right thoracentesis.  ULTRASOUND GUIDED DIAGNOSTIC RIGHT   THORACENTESIS  An ultrasound guided thoracentesis was thoroughly discussed with the patient and questions answered.  The benefits, risks, alternatives and complications were also discussed.  The patient understands and wishes to proceed with the procedure.  Written consent was obtained.  Ultrasound was performed to localize and mark an adequate pocket of fluid in the right  chest.  The area was then prepped and draped in the normal sterile fashion.  1% Lidocaine was used for local anesthesia.  Under ultrasound guidance a 19 gauge Yueh catheter was introduced.  Thoracentesis was performed.  The catheter was removed and a dressing applied.  Complications:  none  Findings: A total of approximately 110 cc's of slightly turbid, yellow fluid was removed. The fluid sample was  sent for laboratory analysis. Secondary to the extensive multiloculated nature of the right pleural effusion , only the above amount of fluid could be removed at this time.  IMPRESSION: Successful ultrasound guided diagnostic  right  thoracentesis yielding 110 cc's of pleural fluid.  Read by: Jeananne Rama, P.A.-C  Original Report Authenticated By: Reola Calkins, M.D.     A:  New rt confluence with prob cap/ empyema.   P:     IR guided thoracentesis for dx done 7/30 see ID Empiric pna tx of immunocompromised pt.see ID BD for underlying copd 8/1 cvts consulted  CARDIOVASCULAR  Lab 07/23/12 0805  TROPONINI --  LATICACIDVEN --  PROBNP 230.1*      A: HTN P:  Continue antihypertensives D/cd ACE inhibitor  RENAL  Lab 07/25/12 0330 07/24/12 0320 07/23/12 1145 07/23/12 0805  NA 127* 128* -- 130*  K 4.1 4.5 -- --  CL 92* 91* -- 94*  CO2 27 27 -- 24  BUN 16 20 -- 24*  CREATININE 1.07 1.01 0.99 1.03  CALCIUM 8.8 9.3 -- 9.4  MG -- --  -- --  PHOS -- -- -- --   Intake/Output      07/31 0701 - 08/01 0700 08/01 0701 - 08/02 0700   P.O. 2030    I.V. (mL/kg) 1234 (13)    Total Intake(mL/kg) 3264 (34.3)    Net +3264         Urine Occurrence 8 x    Stool Occurrence        A:  Mild hypnatremia - avoid excess hypotonic fluids     GASTROINTESTINAL  Lab 07/23/12 1145 07/23/12 0805  AST -- 51*  ALT -- 103*  ALKPHOS -- 138*  BILITOT -- 0.4  PROT 7.8 7.6  ALBUMIN -- 2.4*    A:  GERD. Mild elevation in LFTs P:   PPI F/u LFTs   HEMATOLOGIC  Lab 07/24/12 0320 07/23/12 1145 07/23/12 0805  HGB 12.7* 12.1* 11.9*  HCT 37.1* 34.1* 33.3*  PLT 732* 710* 699*  INR -- -- --  APTT -- -- --   A: No Acute Issues     INFECTIOUS  Lab 07/24/12 0320 07/23/12 1145 07/23/12 0805  WBC 17.1* 23.3* 21.9*  PROCALCITON -- -- --   Cultures: 7/30 bc x 2>> 7/30 sputum>> 7/30 uc>> 7/30 pleural >>  Wbc' s, no org seen>>  Antibiotics: 7/30 roc>> 7/30 zithro>>  A:  CAP   With prob R empyema See flow Tapped  Effusion 7/30 > c/w empyema>8/1 cvts consulted for ? vats Treat cough and pain  ENDOCRINE No results found for this basename: GLUCAP:5 in the last 168 hours A:  Hx of humira use P:   monitor  NEUROLOGIC  A:  No Acute Issue  P:     BEST PRACTICE / DISPOSITION Level of Care:  floor Primary Service:  pccm Consultants:  T surgery Code Status:  full Diet:  Npo until decide timing of surgery DVT Px:  lmwh GI Px:  ppi added 7/31 due to cough Skin Integrity:  intact Social / Family:  No family at bedside  [facility] Minor ACNP Adolph Pollack PCCM Pager [phone] till 3 pm If no answer page [phone]  Pt independently  seen and examined and available cxr's reviewed and I agree with above findings/ imp/ plan   Sandrea Hughs, MD Pulmonary and Critical Care Medicine [facility] Cell [phone]

## 2012-07-25 NOTE — Progress Notes (Signed)
eLink Physician-Brief Progress Note Patient Name: Samuel Sharp DOB: 09/26/1961 MRN: [id number]  Date of Service  07/25/2012   HPI/Events of Note   Discussed case w Dr Cornelius Moras this evening. Will make patient NPO at midnight in event that we decide to recommend FOB  eICU Interventions        Ashyla Luth S. 07/25/2012, 7:30 PM

## 2012-07-25 NOTE — Progress Notes (Signed)
eLink Physician-Brief Progress Note Patient Name: Samuel Sharp DOB: [birth year] MRN: [id number]  Date of Service  07/25/2012   HPI/Events of Note  Pt NPO for possible VATS  eICU Interventions  Has not been evaluated by [facility] yet, doubt that they will see him tonight and then need to do urgent procedure. Iwill restart his diet.   Intervention Category Minor Interventions: Routine modifications to care plan (e.g. PRN medications for pain, fever)  Shay Jhaveri S. 07/25/2012, 5:16 PM

## 2012-07-25 NOTE — Progress Notes (Signed)
Gave report to [facility] 6N RN who will receive Mr. Hard.  Mr. Stann Mainland transfer to [id number].  Vassie verbalized understanding. Will Call [facility] for transportation.

## 2012-07-25 NOTE — Progress Notes (Signed)
Called security to advise that Mr. Buehl's truck will be in the parking lot as he is going to be transferred to [facility].  Security stated that his car would not be towed and security understood that Mr. Wishart could be in the hospital for numerous days.

## 2012-07-25 NOTE — Consult Note (Addendum)
CARDIOTHORACIC SURGERY CONSULTATION REPORT  PCP is STEVENSON, AMY, DO Referring Provider is WERT, Charlaine Dalton, MD   Reason for consultation:  Loculated parapneumonic effusion vs. empyema  HPI:  Patient is a 51 year old white male former smoker with reported history of pulmonary embolism 10 years ago who also routinely uses Humira for psoriasis (but has not been taking for the past month).  The patient reports that he has otherwise in his usual state of fairly good health until approximately 10 days ago when he developed an upper respiratory tract infection associated with cough.  This persisted for several days but did not limit the patient substantially until one week ago when the patient developed fairly sudden onset of increased cough, shortness of breath, and pleuritic right-sided chest pain. The patient states that his cough has been productive of somewhat thick clear to yellowish sputum.  The patient has had intermittent fevers, and he ultimately presented to [facility] 2 days ago where he was admitted for presumed community acquired pneumonia. White blood count was elevated (21,900) at the time of admission, and chest CT scan revealed opacification in the mid right lung consistent with pneumonia versus neoplasm. There was a small pleural effusion. The patient was started on intravenous Rocephin and Zithromax and has been treated with bronchodilators. The patient underwent ultrasound-guided needle thoracentesis yesterday yielding 110 mL of slightly turbid yellow fluid.  There were no organisms seen on Gram stain of fluid and the cell count yielded only 1672 white blood cells with pH 6.5 and LDH 1404. The patient was transferred to [facility] this afternoon and thoracic surgical consultation requested to consider video-assisted thoracoscopy. The patient is currently eating supper.  The patient states that he still has right-sided pleuritic chest  discomfort and some shortness of breath. His cough may be a little bit improved over the past 48 hours but he is still coughing.  Past Medical History  Diagnosis Date  . Asthma     Present all life. Dx as child. Been in [facility] since 2003--pft 06/03/10 FEV1 3.14/ 82%, FEV1/FVC 0.54; insig resp to BD, DLCO 75%, ni lUNG  Vol  . Hyperlipidemia   . Hypertension     on lisinopril 2002/2003--changed ARB march 2011  . Palpitations     started atenolol since 2003. recollects hx of holter. 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 informed it "Not a dangerous condition"-changed to lopressor 12/15/2010  . Pulmonary embolism     does not recollect tpA or being on lot of o2 or hypotension. 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 Psoriasis     on enbre;l since 005. started in [place]. Stopped 2011 by [place] derm due to nodule on chest  . Detached retina   . Lung nodule     5mm on ct 11/10/10- likely lymph node  . History of TB skin testing     negative 2005-2010    Past Surgical History  Procedure Date  . Retinal detachment surgery     Family History  Problem Relation Age of Onset  . Emphysema Father   . Asthma Mother   . Allergies Mother     Social History History  Substance Use Topics  . Smoking status: Former Smoker -- 1.0 packs/day for 35 years    Quit date: 03/25/2010  . Smokeless tobacco: Never Used  . Alcohol Use: Yes     every other  day, 4-5drinks    Prior to Admission medications   Medication Sig Start Date End Date Taking? Authorizing Provider  atorvastatin (LIPITOR) 20 MG tablet Take 10 mg by mouth daily.    Yes Historical Provider, MD  enalapril (VASOTEC) 20 MG tablet Take 20 mg by mouth daily.   Yes Historical Provider, MD  Flaxseed, Linseed, (EQL FLAX SEED OIL) 1000 MG CAPS Take 1,000 mg by mouth daily.   Yes Historical Provider, MD  Fluticasone-Salmeterol (ADVAIR DISKUS) 250-50 MCG/DOSE AEPB Inhale 1 puff into the lungs every 12 (twelve)  hours.     Yes Historical Provider, MD  HYDROcodone-acetaminophen (VICODIN) 5-500 MG per tablet Take 1 tablet by mouth every 6 (six) hours as needed. pain   Yes Historical Provider, MD  hydroxypropyl methylcellulose (ISOPTO TEARS) 2.5 % ophthalmic solution Place 1 drop into both eyes 3 (three) times daily as needed. Dry eyes   Yes Historical Provider, MD  LORazepam (ATIVAN) 2 MG tablet Take 2 mg by mouth 3 (three) times daily. 1 tablet 3 times a day   Yes Historical Provider, MD  metoprolol (LOPRESSOR) 50 MG tablet 1/2 tablet once a day    Yes Historical Provider, MD  Multiple Vitamins-Minerals (MULTIVITAMIN WITH MINERALS) tablet Take 1 tablet by mouth daily.   Yes Historical Provider, MD  Omega-3 Fatty Acids (FISH OIL) 1000 MG CAPS Take 1,000 mg by mouth daily.   Yes Historical Provider, MD    Current Facility-Administered Medications  Medication Dose Route Frequency Provider Last Rate Last Dose  . albuterol (PROVENTIL) (5 MG/ML) 0.5% nebulizer solution 2.5 mg  2.5 mg Nebulization Q6H William S Minor, NP   2.5 mg at 07/25/12 1403  . albuterol (PROVENTIL) (5 MG/ML) 0.5% nebulizer solution 2.5 mg  2.5 mg Nebulization Q3H PRN Vilinda Blanks Minor, NP      . azithromycin (ZITHROMAX) 500 mg in dextrose 5 % 250 mL IVPB  500 mg Intravenous Q24H Vilinda Blanks Minor, NP   500 mg at 07/25/12 0958  . cefTRIAXone (ROCEPHIN) 2 g in dextrose 5 % 50 mL IVPB  2 g Intravenous Daily Vilinda Blanks Minor, NP   2 g at 07/25/12 0958  . chlorpheniramine-HYDROcodone (TUSSIONEX) 10-8 MG/5ML suspension 5 mL  5 mL Oral Q12H PRN Vilinda Blanks Minor, NP   5 mL at 07/25/12 1400  . dextrose 5 %-0.45 % sodium chloride infusion   Intravenous Continuous Purcell Nails, MD 50 mL/hr at 07/25/12 [phone]    . enoxaparin (LOVENOX) injection 40 mg  40 mg Subcutaneous Q24H Storm Frisk, MD   40 mg at 07/25/12 1156  . HYDROcodone-acetaminophen (NORCO/VICODIN) 5-325 MG per tablet 2 tablet  2 tablet Oral Q6H PRN Vilinda Blanks Minor, NP   2 tablet at 07/25/12  1600  . ipratropium (ATROVENT) nebulizer solution 0.5 mg  0.5 mg Nebulization Q6H William S Minor, NP   0.5 mg at 07/25/12 1403  . LORazepam (ATIVAN) tablet 2 mg  2 mg Oral TID Vilinda Blanks Minor, NP   2 mg at 07/25/12 1529  . metoprolol tartrate (LOPRESSOR) tablet 25 mg  25 mg Oral Daily Vilinda Blanks Minor, NP   25 mg at 07/25/12 0958  . pantoprazole (PROTONIX) EC tablet 40 mg  40 mg Oral BID AC Nyoka Cowden, MD   40 mg at 07/25/12 1748  . traMADol (ULTRAM) tablet 50 mg  50 mg Oral Q6H Nyoka Cowden, MD   50 mg at 07/25/12 1748  . DISCONTD: traMADol (ULTRAM) tablet 100 mg  100  mg Oral Q6H Nyoka Cowden, MD      . DISCONTD: traMADol Janean Sark) tablet 50 mg  50 mg Oral Q6H PRN Vilinda Blanks Minor, NP   50 mg at 07/24/12 1658    Allergies  Allergen Reactions  . Ace Inhibitors     cough  . Haloperidol Lactate     REACTION: phsycotic break    Review of Systems:  General:  normal appetite, normal energy   Respiratory:  + cough, no wheezing, no hemoptysis, + right sided pain with inspiration or cough, + shortness of breath   Cardiac:   no exertional chest pain or tightness, no exertional SOB, no resting SOB, no PND, no orthopnea, no LE edema, no palpitations, no syncope  GI:   no difficulty swallowing, no hematochezia, no hematemesis, no melena, no constipation, no diarrhea   GU:   no dysuria, no urgency, no frequency   Musculoskeletal: no arthritis, no arthralgia   Vascular:  no pain suggestive of claudication   Neuro:   no symptoms suggestive of TIA's, no seizures, no headaches, no peripheral neuropathy   Endocrine:  Negative   HEENT:  no loose teeth or painful teeth,  no recent vision changes  Psych:   no anxiety, no depression    Physical Exam:   BP 152/88  Pulse 99  Temp 98.9 F (37.2 C) (Oral)  Resp 17  Ht 6\' 1"  (1.854 m)  Wt 91.627 kg (202 lb)  BMI 26.65 kg/m2  SpO2 97%  General:    well-appearing  HEENT:  Unremarkable   Neck:   no JVD, no bruits, no adenopathy   Chest:   Fairly  clear on auscultation, slightly diminished breath sounds on right but fairly good aeratation, no wheezes, no rhonchi   CV:   RRR, no  murmur   Abdomen:  soft, non-tender, no masses   Extremities:  warm, well-perfused, pulses palpable  Rectal/GU  Deferred  Neuro:   Grossly non-focal and symmetrical throughout  Skin:   Clean and dry, no rashes, no breakdown  Diagnostic Tests:  Results for KAYMEN, ADRIAN (MRN [id number]) as of 07/25/2012 18:33  Ref. Range 07/23/2012 08:05 07/23/2012 11:45 07/24/2012 03:20  WBC Latest Range: 4.0-10.5 K/uL 21.9 (H) 23.3 (H) 17.1 (H)    Results for BLAIZE, EPPLE (MRN [id number]) as of 07/25/2012 18:33  Ref. Range 07/23/2012 13:49  Fluid Type-FLDH No range found PLEURAL  LD, Fluid Latest Range: 3-23 U/L 1404 (H)  pH, Fluid No range found 6.50  pH, Fluid Type No range found CORRECTED ON 07/30 AT 1401: PREVIOUSLY REPORTED AS BODY FLUID  Total protein, fluid No range found 5.2  Fluid Type-FCT No range found PLEURAL  Fluid Type-FTP No range found PLEURAL  Color, Fluid Latest Range: YELLOW  YELLOW  WBC, Fluid Latest Range: 0-1000 cu mm 1672 (H)  Lymphs, Fluid No range found 20  Eos, Fluid No range found 12  Appearance, Fluid Latest Range: CLEAR  CLOUDY (A)  Neutrophil Count, Fluid Latest Range: 0-25 % 63 (H)  Monocyte-Macrophage-Serous Fluid Latest Range: 50-90 % 5 (L)    PLEURAL Special Requests: Normal Gram Stain: WBC SEEN NO ORGANISMS SEEN Gram Stain Report Called to,Read Back By and Verified With: [id number] Report Status: 07/23/2012 FINAL  SPUTUM Special Requests: NONE Gram Stain: RARE WBC PRESENT,BOTH PMN AND MONONUCLEAR RARE SQUAMOUS EPITHELIAL CELLS PRESENT NO ORGANISMS SEEN Culture: NORMAL OROPHARYNGEAL FLORA Report Status: PENDING    CT ANGIOGRAPHY CHEST  Technique: Multidetector CT imaging of the chest using the  standard  protocol during bolus administration of intravenous  contrast. Multiplanar reconstructed images including MIPs were    obtained and reviewed to evaluate the vascular anatomy.  Contrast: OMNIPAQUE IOHEXOL 350 MG/ML SOLN  Comparison: Noncontrast chest CT on 05/18/2011  Findings: Satisfactory opacification of the pulmonary arteries  noted, and there is no evidence of pulmonary emboli. No evidence  of thoracic aortic aneurysm or dissection.  Moderate pulmonary emphysema again demonstrated. A new small right  pleural effusion is seen. An irregular rounded confluent opacity  is seen in the right midlung which appears to involve both the  right upper and lower lobes. This measures 4.9 x 5.1 cm and shows  central low attenuation suspicious for necrosis. Differential  diagnosis includes bronchogenic carcinoma and pneumonia.  Mild right hilar lymphadenopathy is noted, as well as mild  lymphadenopathy in the anterior mediastinum, right paratracheal  region, subcarinal region, and lateral aortic region. Differential  diagnosis includes metastatic disease versus reactive adenopathy.  No evidence of left lung infiltrate or mass. No evidence of left-  sided pleural effusion.  Both adrenal glands are normal in appearance. No suspicious bone  lesions are identified.  IMPRESSION:  1. No evidence of pulmonary embolism.  2. 5 cm irregular confluent opacity in the right midlung involving  both right upper and lower lobes. This is suspicious for  bronchogenic carcinoma, although differential diagnosis also  includes pneumonia. Consider bronchoscopy for further evaluation.  3. Shotty right hilar and mediastinal lymph nodes.  4. Small right pleural effusion.  5. Moderate pulmonary emphysema.  6. Consultation with the [facility] [phone]) should be considered.  Original Report Authenticated By: Danae Orleans, M.D.    Impression:  The patient has community acquired pneumonia complicated by small partially loculated right pleural effusion.  Followup chest x-ray performed  after ultrasound-guided needle thoracentesis reveals fairly good expansion of the right lung without further significant accumulation of pleural fluid.  I have reviewed the patient's chest CT scan and subsequent followup chest radiographs. I do not feel that video assisted thoracoscopy or thoracotomy is necessary at this time. The patient remains in the acute phase of his illness, and is certainly possible that additional fluid could accumulate and ultimately mandate surgical intervention. However, I have reviewed his films with one of my partners, and we concur at this point that there does not appear to be enough fluid remaining in the pleural space to justify surgical drainage at this time. I do feel it would make sense for the patient to undergo fiberoptic bronchoscopy for diagnostic purposes.    Recommendations:  I recommend continued intravenous antibiotics and nebulized bronchodilator therapy with repeat chest x-ray and blood work daily for the next few days. If the patient develops further increased accumulation of pleural fluid he may need video assisted thoracoscopic drainage.  In the meanwhile, I would favor proceeding with diagnostic bronchoscopy. These recommendations have been discussed at length with the patient and his friend in his hospital room and communicated directly via telephone to the on-call physician (Dr. Delton Coombes) from the Pulmonary Medicine team.      Salvatore Decent. Cornelius Moras, MD 07/25/2012 6:28 PM

## 2012-07-26 ENCOUNTER — Inpatient Hospital Stay (HOSPITAL_COMMUNITY): Payer: BC Managed Care – PPO

## 2012-07-26 ENCOUNTER — Other Ambulatory Visit: Payer: Self-pay

## 2012-07-26 ENCOUNTER — Encounter (HOSPITAL_COMMUNITY)
Admission: EM | Disposition: A | Discharge status: Home / Self Care | Payer: Self-pay | Source: Home / Self Care | Attending: Critical Care Medicine

## 2012-07-26 DIAGNOSIS — R918 Other nonspecific abnormal finding of lung field: Secondary | ICD-10-CM

## 2012-07-26 DIAGNOSIS — R222 Localized swelling, mass and lump, trunk: Secondary | ICD-10-CM

## 2012-07-26 DIAGNOSIS — I2699 Other pulmonary embolism without acute cor pulmonale: Secondary | ICD-10-CM

## 2012-07-26 DIAGNOSIS — J869 Pyothorax without fistula: Secondary | ICD-10-CM

## 2012-07-26 HISTORY — PX: VIDEO BRONCHOSCOPY: SHX5072

## 2012-07-26 LAB — CBC
MCH: 32.1 pg (ref 26.0–34.0)
MCV: 93.3 fL (ref 78.0–100.0)
Platelets: 590 10*3/uL — ABNORMAL HIGH (ref 150–400)
RDW: 12.2 % (ref 11.5–15.5)
WBC: 22.1 10*3/uL — ABNORMAL HIGH (ref 4.0–10.5)

## 2012-07-26 LAB — CULTURE, RESPIRATORY W GRAM STAIN

## 2012-07-26 SURGERY — BRONCHOSCOPY, WITH FLUOROSCOPY
Anesthesia: Moderate Sedation | Laterality: Bilateral

## 2012-07-26 MED ORDER — LIDOCAINE HCL 2 % EX GEL
CUTANEOUS | Status: DC | PRN
  Administered 2012-07-26: 1

## 2012-07-26 MED ORDER — PHENYLEPHRINE HCL 0.25 % NA SOLN
NASAL | Status: DC | PRN
  Administered 2012-07-26: 2 via NASAL

## 2012-07-26 MED ORDER — FENTANYL CITRATE 0.05 MG/ML IJ SOLN
INTRAMUSCULAR | Status: DC | PRN
  Administered 2012-07-26 (×6): 50 ug via INTRAVENOUS

## 2012-07-26 MED ORDER — FENTANYL CITRATE 0.05 MG/ML IJ SOLN
INTRAMUSCULAR | Status: AC
  Filled 2012-07-26: qty 2

## 2012-07-26 MED ORDER — SODIUM CHLORIDE 0.9 % IV SOLN
INTRAVENOUS | Status: DC
  Administered 2012-07-26: 20:00:00 via INTRAVENOUS
  Administered 2012-07-26: 10 mL via INTRAVENOUS

## 2012-07-26 MED ORDER — FENTANYL CITRATE 0.05 MG/ML IJ SOLN
INTRAMUSCULAR | Status: AC
  Filled 2012-07-26: qty 4

## 2012-07-26 MED ORDER — MIDAZOLAM HCL 10 MG/2ML IJ SOLN
INTRAMUSCULAR | Status: DC | PRN
  Administered 2012-07-26 (×4): 1 mg via INTRAVENOUS
  Administered 2012-07-26: 2 mg via INTRAVENOUS
  Administered 2012-07-26: 1 mg via INTRAVENOUS

## 2012-07-26 MED ORDER — LIDOCAINE HCL (PF) 1 % IJ SOLN
INTRAMUSCULAR | Status: DC | PRN
  Administered 2012-07-26: 6 mL

## 2012-07-26 MED ORDER — MIDAZOLAM HCL 10 MG/2ML IJ SOLN
INTRAMUSCULAR | Status: AC
  Filled 2012-07-26: qty 4

## 2012-07-26 NOTE — Progress Notes (Addendum)
Name: Samuel Sharp MRN: [id number] DOB: 1961-04-23    LOS: 3  Referring Provider:  EDP Reason for Referral:  SOB  PULMONARY / CRITICAL CARE MEDICINE  HPI:  51 yo wm former smoker(uses electronic cigarette now), hx of PE off coumadin, psoriasis uses humira but off x 1 month for recent URI,  admitted 7/30  with  SOB, pleuritic cp,  clear sputum. Ct chest c/w  consistent with pna and loculated effusion on R with no real change in the R sided spn.    Cultures: 7/30 bc x 2>> 7/30 sputum>>NF 7/30 pleural >>  Wbc' s, no org seen>> 7/30: urine strep and legionella: negative  Antibiotics: 7/30 roc>> 7/30 zithro>>  Events Since Admission: 7/30 rt Thora with 110 cc yellow fluid > c/w empyema 8/1 cvts consult>> 7/30: pleural cytology: negative.   Current Status: Tol RA, persistent R ant and lat cp worse with cough  Vital Signs: Temp:  [98.3 F (36.8 C)-99.3 F (37.4 C)] 98.3 F (36.8 C) (08/02 0649) Pulse Rate:  [90-99] 94  (08/02 0649) Resp:  [17-20] 20  (08/02 0649) BP: (113-152)/(68-92) 139/75 mmHg (08/02 0649) SpO2:  [92 %-98 %] 98 % (08/02 0818) Weight:  [91.627 kg (202 lb)] 91.627 kg (202 lb) (08/01 1700) 2 liters n/c  Physical Examination: General: WNWDWMNAD. Reports feeling weak, co cough with yellow sputum. Spiked fever 8/1 thru abx Neuro:  Intact HEENT: No andeopathy Neck:  No Jvd Cardiovascular:  hsr rrr Lungs:crackles on right  Abdomen:  +bs Musculoskeletal:  intact Skin:  Psoriasis Bilateral knees  Lab 07/25/12 0330 07/24/12 0320 07/23/12 1145 07/23/12 0805  NA 127* 128* -- 130*  K 4.1 4.5 -- 4.3  CL 92* 91* -- 94*  CO2 27 27 -- 24  BUN 16 20 -- 24*  CREATININE 1.07 1.01 0.99 --  GLUCOSE 94 89 -- 99    Lab 07/26/12 0625 07/24/12 0320 07/23/12 1145  HGB 10.5* 12.7* 12.1*  HCT 30.5* 37.1* 34.1*  WBC 22.1* 17.1* 23.3*  PLT 590* 732* 710*      Dg Chest 2 View  07/26/2012  *RADIOLOGY REPORT*  Clinical Data: Follow-up right-sided infection  CHEST - 2 VIEW   Comparison: 07/25/2012  Findings: The left chest remains clear except for mild scarring or atelectasis at the left base.  Persistent loculated pleural density on the right appears the same.  Some adjacent pulmonary parenchymal volume loss appears the same.  No worsening or new finding.  IMPRESSION: No change in loculated pleural fluid on the right with adjacent pulmonary density.  Original Report Authenticated By: Thomasenia Sales, M.D.   Dg Chest 2 View  07/25/2012  *RADIOLOGY REPORT*  Clinical Data: Thoracentesis 07/24/2012, right chest pain, cough, congestion  CHEST - 2 VIEW  Comparison: 07/24/2012, 07/19/2012  Findings: Irregular right midlung consolidation/opacity again evident.  No significant change.  Medial density along the right lower hemithorax and along the minor fissure correlates with a loculated right effusion by CT comparison.  No pneumothorax or significant interval change.  Background emphysema noted.  Stable left lung aeration.  No left effusion.  Stable heart size and vascularity.  Trachea is midline.  IMPRESSION: Stable right midlung consolidation/airspace process.  Persistent loculated right pleural effusion.  No significant interval change.  Original Report Authenticated By: Judie Petit. Ruel Favors, M.D.   SSESSMENT AND PLAN  New rt confluence with prob cap/ empyema.  Tapped  Effusion 7/30 > c/w empyema>8/1 cvts consulted for ? Vats. As of 8/1 they recommend medical management. No  sig change in pleural fluid collection, the adjacent consolidative mass in right midlung appears unchanged. Unclear if this is pneumonia or malignancy  P:   Empiric pna tx of immunocompromised pt.see ID BD for underlying copd Bronch today at 130pm  HTN P:  Continue antihypertensives D/cd ACE inhibitor   Mild hypnatremia - avoid excess hypotonic fluids   GERD. Mild elevation in LFTs P:   PPI F/u LFTs   Hx of humira use P:   monitor  Will bronch today, the loculated effusions are old and are of no  concern at this time.  The speculated mass is approachable bronchoscopically.  Will get biopsy.  Patient seen and examined, agree with above note.  I dictated the care and orders written for this patient under my direction.  Koren Bound, M.D. [phone]

## 2012-07-26 NOTE — Progress Notes (Signed)
   CARDIOTHORACIC SURGERY PROGRESS NOTE  Day of Surgery  S/P Procedure(s) (LRB): VIDEO BRONCHOSCOPY WITH FLUORO (Bilateral)  Subjective: Reports feeling better since this morning.  Results of bronch noted.  Objective: Vital signs in last 24 hours: Temp:  [98.3 F (36.8 C)-99.1 F (37.3 C)] 98.3 F (36.8 C) (08/02 0649) Pulse Rate:  [92-94] 94  (08/02 0649) Cardiac Rhythm:  [-]  Resp:  [9-27] 22  (08/02 1529) BP: (113-193)/(68-105) 138/79 mmHg (08/02 1529) SpO2:  [88 %-98 %] 92 % (08/02 1529)  Physical Exam:  Rhythm:   sinus  Breath sounds: Fairly clear, few wheezed on right  Heart sounds:  RRR  Incisions:  n/a  Abdomen:  soft  Extremities:  warm   Intake/Output from previous day: 08/01 0701 - 08/02 0700 In: 1040 [P.O.:240; I.V.:800] Out: -  Intake/Output this shift: Total I/O In: 1350 [I.V.:150; IV Piggyback:1200] Out: -   Lab Results:  Basename 07/26/12 0625 07/24/12 0320  WBC 22.1* 17.1*  HGB 10.5* 12.7*  HCT 30.5* 37.1*  PLT 590* 732*   BMET:  Basename 07/25/12 0330 07/24/12 0320  NA 127* 128*  K 4.1 4.5  CL 92* 91*  CO2 27 27  GLUCOSE 94 89  BUN 16 20  CREATININE 1.07 1.01  CALCIUM 8.8 9.3    CBG (last 3)  No results found for this basename: GLUCAP:3 in the last 72 hours PT/INR:  No results found for this basename: LABPROT,INR in the last 72 hours  CXR:  *RADIOLOGY REPORT*  Clinical Data: Follow-up right-sided infection  CHEST - 2 VIEW  Comparison: 07/25/2012  Findings: The left chest remains clear except for mild scarring or  atelectasis at the left base. Persistent loculated pleural density  on the right appears the same. Some adjacent pulmonary parenchymal  volume loss appears the same. No worsening or new finding.  IMPRESSION:  No change in loculated pleural fluid on the right with adjacent  pulmonary density.  Original Report Authenticated By: Thomasenia Sales, M.D.   Assessment/Plan: S/P Procedure(s) (LRB): VIDEO BRONCHOSCOPY WITH  FLUORO (Bilateral)  Mr Brandy seems to be improving and CXR remains stable.  No indications for VATS presently and I am hopeful that he will continue to recover w/out need for surgical intervention.  He will need f/u CT scan in 6-8 weeks if he continues to improve on medical Rx for pneumonia.  I will be out of town all through next week.  My partners will cover.  Jehan Ranganathan H 07/26/2012 5:23 PM

## 2012-07-26 NOTE — Procedures (Addendum)
Bronchoscopy Procedure Note Brylen Lampert [id number] [birth year]  Procedure: Bronchoscopy Indications: Diagnostic evaluation of the airways and Obtain specimens for culture and/or other diagnostic studies  Procedure Details Consent: Risks of procedure as well as the alternatives and risks of each were explained to the (patient/caregiver).  Consent for procedure obtained. Time Out: Verified patient identification, verified procedure, site/side was marked, verified correct patient position, special equipment/implants available, medications/allergies/relevent history reviewed, required imaging and test results available.  Performed  In preparation for procedure, patient was given 100% FiO2 and bronchoscope lubricated. Sedation: Benzodiazepines and Fentanyl  Airway entered and the following bronchi were examined: RUL, RML, RLL, LUL, LLL and Bronchi.   Procedures performed: Brushings performed Bronchoscope removed.    Evaluation Hemodynamic Status: BP stable throughout; O2 sats: stable throughout Patient's Current Condition: stable Specimens:  Sent serosanguinous fluid Complications: No apparent complications Patient did tolerate procedure well.  Brush, biopsy and BAL sent.  The RUL appeared very convoluted with external compression and narrowing of the posterior segment of the RUL.  Washing and brushing were obtained from the RUL.  Koren Bound 07/26/2012

## 2012-07-26 NOTE — Progress Notes (Signed)
Video bronchoscopy procedure performed. BAL intervention performed, Brushing intervention performed. Biopsy intervention performed.

## 2012-07-27 ENCOUNTER — Inpatient Hospital Stay (HOSPITAL_COMMUNITY): Payer: BC Managed Care – PPO

## 2012-07-27 LAB — COMPREHENSIVE METABOLIC PANEL
ALT: 40 U/L (ref 0–53)
AST: 16 U/L (ref 0–37)
Alkaline Phosphatase: 173 U/L — ABNORMAL HIGH (ref 39–117)
CO2: 28 mEq/L (ref 19–32)
Calcium: 8.9 mg/dL (ref 8.4–10.5)
Chloride: 90 mEq/L — ABNORMAL LOW (ref 96–112)
GFR calc Af Amer: >90 mL/min (ref >90)
GFR calc non Af Amer: >90 mL/min (ref >90)
Glucose, Bld: 86 mg/dL (ref 70–99)
Potassium: 4.2 mEq/L (ref 3.5–5.1)
Sodium: 127 mEq/L — ABNORMAL LOW (ref 135–145)
Total Bilirubin: 0.4 mg/dL (ref 0.3–1.2)

## 2012-07-27 LAB — CBC
Hemoglobin: 10.3 g/dL — ABNORMAL LOW (ref 13.0–17.0)
MCH: 32.9 pg (ref 26.0–34.0)
Platelets: 552 10*3/uL — ABNORMAL HIGH (ref 150–400)
RBC: 3.13 MIL/uL — ABNORMAL LOW (ref 4.22–5.81)
WBC: 15.8 10*3/uL — ABNORMAL HIGH (ref 4.0–10.5)

## 2012-07-27 NOTE — Progress Notes (Signed)
Subjective: Feels he is improving, but still with chest pain on coughing.  No hemoptysis or increased wob.  Objective: Vital signs in last 24 hours: Blood pressure 133/77, pulse 90, temperature 97.3 F (36.3 C), temperature source Oral, resp. rate 18, height 6\' 1"  (1.854 m), weight 91.627 kg (202 lb), SpO2 98.00%.  Intake/Output from previous day: 08/02 0701 - 08/03 0700 In: 1595.8 [I.V.:395.8; IV Piggyback:1200] Out: -    Physical Exam:   wd male in nad Nose without purulence or discharge noted. Chest with crackles and a rare wheeze in right base, o/w clear. Cor with rrr LE without edema, no cyanosis noted. Alert and oriented, moves all 4.    Lab Results:  [facility] 07/27/12 0615 07/26/12 0625  WBC 15.8* 22.1*  HGB 10.3* 10.5*  HCT 29.4* 30.5*  PLT 552* 590*   BMET  Basename 07/27/12 0615 07/25/12 0330  NA 127* 127*  K 4.2 4.1  CL 90* 92*  CO2 28 27  GLUCOSE 86 94  BUN 13 16  CREATININE 0.98 1.07  CALCIUM 8.9 8.8    Studies/Results: Dg Chest 2 View  07/27/2012  *RADIOLOGY REPORT*  Clinical Data: Status post bronchoscopy  CHEST - 2 VIEW  Comparison: 07/26/2012  Findings: No pneumothorax is seen.  Stable mass in the lateral right mid lung.  Stable loculated right pleural fluid.  Left lung is essentially clear.  The heart is normal in size  IMPRESSION: No pneumothorax is seen.  Original Report Authenticated By: Charline Bills, M.D.   Dg Chest 2 View  07/26/2012  *RADIOLOGY REPORT*  Clinical Data: Follow-up right-sided infection  CHEST - 2 VIEW  Comparison: 07/25/2012  Findings: The left chest remains clear except for mild scarring or atelectasis at the left base.  Persistent loculated pleural density on the right appears the same.  Some adjacent pulmonary parenchymal volume loss appears the same.  No worsening or new finding.  IMPRESSION: No change in loculated pleural fluid on the right with adjacent pulmonary density.  Original Report Authenticated By: Thomasenia Sales,  M.D.   Dg Chest Port 1 View  07/26/2012  *RADIOLOGY REPORT*  Clinical Data: Transbronchial biopsy, evaluate for pneumothorax  PORTABLE CHEST - 1 VIEW  Comparison: 07/26/2012 at 0603 hours  Findings: Stable lateral right mid lung opacity.  Stable loculated right pleural effusion.  Although a faint linear opacity overlies the right upper lung, this does not reflect a definite pleural line.  Close attention on follow-up is suggested.  Left lung is essentially clear.  The heart is normal in size.  IMPRESSION: No definite right pneumothorax is seen.  Close attention on follow- up is suggested.  Stable right lung opacity/pleural fluid.  These results were discussed in person on 07/26/2012 at 1450 hrs with Dr Molli Knock, who verbally acknowledged these results.  Original Report Authenticated By: Charline Bills, M.D.    Assessment/Plan: Patient Active Hospital Problem List:  Pulmonary infiltrate Most likely represents pna, and bronch specimens pending so far.  He is afebrile on abx.   -f/u bronch cultures -continue IV abx thru w/e, then change to PO.  Complicated parapneumonic effusion Very inflammatory by chemistries and loculated.  Unclear if infected.  Thoracic surgery following, and would like to wait on surgical intervention.   Copd The pt has no acute bronchospasm or respiratory distress.  Hyponatremia ?due to his underlying lung process.  Stable.  Will continue to monitor.   Barbaraann Share, M.D. 07/27/2012, 11:05 AM

## 2012-07-27 NOTE — ED Provider Notes (Signed)
I saw and evaluated the patient, reviewed the resident's note and I agree with the findings and plan.   .Face to face Exam:  General:  Awake HEENT:  Atraumatic Resp:  Normal effort Abd:  Nondistended Neuro:No focal weakness Lymph: No adenopathy   Nelia Shi, MD 07/27/12 [phone]

## 2012-07-28 LAB — CULTURE, RESPIRATORY W GRAM STAIN

## 2012-07-28 NOTE — Progress Notes (Signed)
Chest radiograph reviewed Small loculated pleural effusion without evidence of infection Recommend continue current management nonoperatively- empiric antibiotics

## 2012-07-28 NOTE — Progress Notes (Signed)
Subjective: Feels he is improving, but still with chest pain on coughing.  No hemoptysis or increased wob. Culture still not finalized as of yet.  Objective: Vital signs in last 24 hours: Blood pressure 112/60, pulse 77, temperature 99 F (37.2 C), temperature source Oral, resp. rate 20, height 6\' 1"  (1.854 m), weight 91.627 kg (202 lb), SpO2 96.00%.  Intake/Output from previous day: 08/03 0701 - 08/04 0700 In: 1782.3 [P.O.:1080; I.V.:402.3; IV Piggyback:300] Out: -    Physical Exam:   wd male in nad Nose without purulence or discharge noted. Chest with crackles and a rare wheeze in right base, o/w clear. +upper airway pseudowheezing. Cor with rrr LE without edema, no cyanosis noted. Alert and oriented, moves all 4.    Lab Results:  [facility] 07/27/12 0615 07/26/12 0625  WBC 15.8* 22.1*  HGB 10.3* 10.5*  HCT 29.4* 30.5*  PLT 552* 590*   BMET  Basename 07/27/12 0615  NA 127*  K 4.2  CL 90*  CO2 28  GLUCOSE 86  BUN 13  CREATININE 0.98  CALCIUM 8.9    Studies/Results: Dg Chest 2 View  07/27/2012  *RADIOLOGY REPORT*  Clinical Data: Status post bronchoscopy  CHEST - 2 VIEW  Comparison: 07/26/2012  Findings: No pneumothorax is seen.  Stable mass in the lateral right mid lung.  Stable loculated right pleural fluid.  Left lung is essentially clear.  The heart is normal in size  IMPRESSION: No pneumothorax is seen.  Original Report Authenticated By: Charline Bills, M.D.   Dg Chest Port 1 View  07/26/2012  *RADIOLOGY REPORT*  Clinical Data: Transbronchial biopsy, evaluate for pneumothorax  PORTABLE CHEST - 1 VIEW  Comparison: 07/26/2012 at 0603 hours  Findings: Stable lateral right mid lung opacity.  Stable loculated right pleural effusion.  Although a faint linear opacity overlies the right upper lung, this does not reflect a definite pleural line.  Close attention on follow-up is suggested.  Left lung is essentially clear.  The heart is normal in size.  IMPRESSION: No definite  right pneumothorax is seen.  Close attention on follow- up is suggested.  Stable right lung opacity/pleural fluid.  These results were discussed in person on 07/26/2012 at 1450 hrs with Dr Molli Knock, who verbally acknowledged these results.  Original Report Authenticated By: Charline Bills, M.D.    Assessment/Plan: Patient Active Hospital Problem List:  Pulmonary infiltrate Most likely represents pna, and bronch specimens pending so far.  He is afebrile on abx.   -f/u bronch cultures -continue IV abx thru w/e, then change to PO. Should be able to do this in the am.  -trial of d/c oxygen.  Complicated parapneumonic effusion Very inflammatory by chemistries and loculated.  Unclear if infected.  Thoracic surgery has seen, and would like to wait on surgical intervention.  Pain not overly severe, and no significant "trapped lung" on ct chest. Will check cxr in am  Copd The pt has no acute bronchospasm or respiratory distress.  Hyponatremia ?due to his underlying lung process.  Stable.  Will continue to monitor (check labs in am)   Barbaraann Share, M.D. 07/28/2012, 10:54 AM

## 2012-07-29 ENCOUNTER — Inpatient Hospital Stay (HOSPITAL_COMMUNITY): Payer: BC Managed Care – PPO

## 2012-07-29 ENCOUNTER — Encounter (HOSPITAL_COMMUNITY): Payer: Self-pay | Admitting: Pulmonary Disease

## 2012-07-29 LAB — CBC
Hemoglobin: 10.9 g/dL — ABNORMAL LOW (ref 13.0–17.0)
MCV: 94.3 fL (ref 78.0–100.0)
Platelets: 574 10*3/uL — ABNORMAL HIGH (ref 150–400)
RBC: 3.32 MIL/uL — ABNORMAL LOW (ref 4.22–5.81)
WBC: 18.1 10*3/uL — ABNORMAL HIGH (ref 4.0–10.5)

## 2012-07-29 LAB — BASIC METABOLIC PANEL
CO2: 29 mEq/L (ref 19–32)
Chloride: 91 mEq/L — ABNORMAL LOW (ref 96–112)
Creatinine, Ser: 0.97 mg/dL (ref 0.50–1.35)
Glucose, Bld: 84 mg/dL (ref 70–99)
Sodium: 130 mEq/L — ABNORMAL LOW (ref 135–145)

## 2012-07-29 MED ORDER — MOXIFLOXACIN HCL 400 MG PO TABS: 400.0000 mg | ORAL_TABLET | Freq: Every day | ORAL | Status: DC

## 2012-07-29 MED ORDER — PANTOPRAZOLE SODIUM 40 MG PO TBEC: 40.0000 mg | DELAYED_RELEASE_TABLET | Freq: Two times a day (BID) | ORAL | Status: DC

## 2012-07-29 MED ORDER — ALBUTEROL SULFATE HFA 108 (90 BASE) MCG/ACT IN AERS: 2.0000 | INHALATION_SPRAY | RESPIRATORY_TRACT | Status: DC | PRN

## 2012-07-29 MED ORDER — HYDROCOD POLST-CHLORPHEN POLST 10-8 MG/5ML PO LQCR: 5.0000 mL | Freq: Two times a day (BID) | ORAL | Status: DC | PRN

## 2012-07-29 MED ORDER — TRAMADOL HCL 50 MG PO TABS: 50.0000 mg | ORAL_TABLET | Freq: Four times a day (QID) | ORAL | Status: DC

## 2012-07-29 NOTE — Progress Notes (Signed)
Discharge home.

## 2012-07-29 NOTE — Progress Notes (Signed)
NP paged about the unsigned prescription, she stated that she will  Call the pharmacy.

## 2012-07-29 NOTE — Progress Notes (Signed)
Clinical Social Work Department BRIEF PSYCHOSOCIAL ASSESSMENT 07/29/2012  Patient:  Samuel Sharp,Samuel Sharp     Account Number:  [id number]     Admit date:  07/23/2012  Clinical Social Worker:  Dennison Bulla  Date/Time:  07/29/2012 12:30 PM  Referred by:  Physician  Date Referred:  07/29/2012 Referred for  Transportation assistance   Other Referral:   Interview type:  Patient Other interview type:    PSYCHOSOCIAL DATA Living Status:  ALONE Admitted from facility:   Level of care:   Primary support name:  Jomarie Longs Primary support relationship to patient:  SIBLING Degree of support available:   Adequate    CURRENT CONCERNS Current Concerns  Other - See comment   Other Concerns:   Transportation    SOCIAL WORK ASSESSMENT / PLAN CSW received referral from MD that patient was at [facility] and was transferred to [facility]. Patient needs assistance getting back to car at [facility].    CSW met with patient at bedside and introduced myself. Patient reports that he does not have anyone that can help him with transportation but reports if he gets back to his car he can drive home. Patient has his car keys.    CSW called security who is agreeable to transport patient back to car. CSW informed RN to take patient to [facility]. CSW is signing off.   Assessment/plan status:  No Further Intervention Required Other assessment/ plan:   Information/referral to community resources:   Security    PATIENT'S/FAMILY'S RESPONSE TO PLAN OF CARE: Patient alert and oriented. Patient appreciative of help.

## 2012-07-29 NOTE — Discharge Summary (Signed)
Physician Discharge Summary  Patient ID: Samuel Sharp MRN: [id number] DOB/AGE: 1961/01/12 51 y.o.  Admit date: 07/23/2012 Discharge date: 07/29/2012    Discharge Diagnoses:  Principal Problem:  *Postobstructive pneumonia Active Problems:  HYPERLIPIDEMIA  TOBACCO ABUSE  HYPERTENSION  PULMONARY EMBOLISM  PULMONARY NODULE, RIGHT LOWER LOBE  Asthma with chronic obstructive pulmonary disease (COPD)  Cough due to angiotensin-converting enzyme inhibitor  Psoriasis  Empyema  Pneumonia, organism unspecified    Brief Summary: 51 yo wm former smoker(uses electronic cigarette now), hx of PE off coumadin, psoriasis uses humira but off x 1 month for recent URI, presents with increase SOB, clear sputum. Ct chest reveals finding consistent with pna and or bronchogenic cancer. PCCM will admit with plan to have IR tap rt effusion and FOB for further clarification and diagosis of rt lung process.                                                                     Hospital Summary by Discharge Diagnosis Pulmonary infiltrate  Most likely represents pna, and bronch specimens pending so far. He is afebrile on abx. Cultures neg to date.  Cytology on pleural fluid neg for malignant cells but obscured by acute inflammation.  Bx from FOB pending.  Will change IV rocephin/ azithro to PO Avelox to complete 8 days total abx.  Stable off O2.  Seen in consultation by CVTS but no need VATS at this time.  Remains ??underling malignancy.  Await outpt f/u for FOB bx results.   Complicated parapneumonic effusion  Very inflammatory by chemistries and loculated. Unclear if infected. Thoracic surgery has seen, and would like to wait on surgical intervention. Pain not overly severe, and no significant "trapped lung" on ct chest. Will need repeat CT chest in 6-8 weeks.   Copd  The pt has no acute bronchospasm or respiratory distress.  No acute exacerbation. Cont Advair as previous on d/c.   Hyponatremia  ?due to his  underlying lung process. Stable. Will continue to monitor.  Will check labs at outpt visit.     Consults: CVTS Barry Dienes)   Cultures:  7/30 bc x 2>>  7/30 sputum>>normal flora    7/30 pleural >> Wbc' s, no org seen>> neg  7/30: urine strep and legionella: negative   Antibiotics:  7/30 roc>> 8/5 7/30 zithro>>8/5 8/5 Avelox>>>8/7   Significant Diagnostic Studies:  8/2 FOB>>> The RUL appeared very convoluted with external compression and narrowing of the posterior segment of the RUL. Washing and brushing were obtained from the RUL   Filed Vitals:   07/28/12 1953 07/28/12 2107 07/29/12 0632 07/29/12 0825  BP:  119/64 133/81   Pulse:  91 97   Temp:  99.7 F (37.6 C) 98.6 F (37 C)   TempSrc:  Oral    Resp:  19 19   Height:      Weight:      SpO2: 94% 94% 97% 97%  On RA    Discharge Labs  BMET  Lab 07/29/12 0530 07/27/12 0615 07/25/12 0330 07/24/12 0320 07/23/12 1145 07/23/12 0805  NA 130* 127* 127* 128* -- 130*  K 4.3 4.2 -- -- -- --  CL 91* 90* 92* 91* -- 94*  CO2 29 28 27 27  -- 24  GLUCOSE  84 86 94 89 -- 99  BUN 8 13 16 20  -- 24*  CREATININE 0.97 0.98 1.07 1.01 0.99 --  CALCIUM 8.9 8.9 8.8 9.3 -- 9.4  MG -- -- -- -- -- --  PHOS -- -- -- -- -- --     CBC   Lab 07/29/12 0530 07/27/12 0615 07/26/12 0625  HGB 10.9* 10.3* 10.5*  HCT 31.3* 29.4* 30.5*  WBC 18.1* 15.8* 22.1*  PLT 574* 552* 590*    Discharge Orders    Future Appointments: Provider: Department: Dept Phone: Center:   08/09/2012 10:45 AM Julio Sicks, NP [facility] [phone] None       Follow-up Information    Follow up with [facility], NP on 08/09/2012. (10:45am )    Contact information:   [facility] [address] [place] [address] [phone]       Follow up with Nadean Corwin, MD. Schedule an appointment as soon as possible for a visit in 1 month.   Contact information:   [address] [place]  [id number] [phone]           Medication List  As of 07/29/2012 11:09 AM   START taking these medications         albuterol 108 (90 BASE) MCG/ACT inhaler   Commonly known as: PROVENTIL HFA;VENTOLIN HFA   Inhale 2 puffs into the lungs every 4 (four) hours as needed for wheezing or shortness of breath.      chlorpheniramine-HYDROcodone 10-8 MG/5ML Lqcr   Commonly known as: TUSSIONEX   Take 5 mLs by mouth every 12 (twelve) hours as needed.      moxifloxacin 400 MG tablet   Commonly known as: AVELOX   Take 1 tablet (400 mg total) by mouth daily.      pantoprazole 40 MG tablet   Commonly known as: PROTONIX   Take 1 tablet (40 mg total) by mouth 2 (two) times daily before a meal.      traMADol 50 MG tablet   Commonly known as: ULTRAM   Take 1 tablet (50 mg total) by mouth every 6 (six) hours.         CONTINUE taking these medications         ADVAIR DISKUS 250-50 MCG/DOSE Aepb   Generic drug: Fluticasone-Salmeterol      atorvastatin 20 MG tablet   Commonly known as: LIPITOR      EQL FLAX SEED OIL 1000 MG Caps      Fish Oil 1000 MG Caps      HYDROcodone-acetaminophen 5-500 MG per tablet   Commonly known as: VICODIN      hydroxypropyl methylcellulose 2.5 % ophthalmic solution   Commonly known as: ISOPTO TEARS      LORazepam 2 MG tablet   Commonly known as: ATIVAN      metoprolol 50 MG tablet   Commonly known as: LOPRESSOR      multivitamin with minerals tablet         STOP taking these medications         enalapril 20 MG tablet          Where to get your medications    These are the prescriptions that you need to pick up. We sent them to a specific pharmacy, so you will need to go there to get them.   [facility] - [place], [place] - [address]    [address] [place]  [address]    Phone: [phone]        albuterol 108 (90 BASE) MCG/ACT inhaler   moxifloxacin 400 MG tablet   pantoprazole 40 MG tablet    traMADol 50 MG tablet         You may get these medications from any pharmacy.         chlorpheniramine-HYDROcodone 10-8 MG/5ML Lqcr              Disposition: home   Discharged Condition: Cray Clementson has met maximum benefit of inpatient care and is medically stable and cleared for discharge.  Patient is pending follow up as above.      Time spent on disposition:  Greater than 35 minutes.   SignedDanford Bad, NP 07/29/2012  11:09 AM Pager: [phone] or [phone]  *Care during the described time interval was provided by me and/or other providers on the critical care team. I have reviewed this patient's available data, including medical history, events of note, physical examination and test results as part of my evaluation.  Will D/c home today with understanding that he is to follow up with the office since the negative result is only for the pleural fluid.  Office apt made and number given to patient.  Patient seen and examined, agree with above note.  I dictated the care and orders written for this patient under my direction.  Koren Bound, M.D. [phone]

## 2012-07-31 NOTE — Progress Notes (Signed)
Rush Farmer, M.D. [facility] Pulmonary/Critical Care Medicine. Pager: [phone]. After hours pager: [phone].

## 2012-08-08 NOTE — ED Provider Notes (Addendum)
I saw and evaluated the patient, reviewed the resident's note and I agree with the findings and plan.   .Face to face Exam:  General:  Awake HEENT:  Atraumatic Resp:  Normal effort Abd:  Nondistended Neuro:No focal weakness Lymph: No adenopathy    Date: 08/22/2012  Rate: 89  Rhythm: normal sinus rhythm  QRS Axis: normal  Intervals: normal  ST/T Wave abnormalities: normal  Conduction Disutrbances: none  Narrative Interpretation: unremarkable      Nelia Shi, MD 08/22/12 1710

## 2012-08-09 ENCOUNTER — Ambulatory Visit (INDEPENDENT_AMBULATORY_CARE_PROVIDER_SITE_OTHER): Payer: BC Managed Care – PPO | Admitting: Adult Health

## 2012-08-09 ENCOUNTER — Encounter: Payer: Self-pay | Admitting: Adult Health

## 2012-08-09 ENCOUNTER — Other Ambulatory Visit (INDEPENDENT_AMBULATORY_CARE_PROVIDER_SITE_OTHER): Payer: BC Managed Care – PPO

## 2012-08-09 ENCOUNTER — Ambulatory Visit (INDEPENDENT_AMBULATORY_CARE_PROVIDER_SITE_OTHER)
Admission: RE | Admit: 2012-08-09 | Discharge: 2012-08-09 | Disposition: A | Discharge status: Home / Self Care | Payer: BC Managed Care – PPO | Source: Ambulatory Visit | Attending: Adult Health | Admitting: Adult Health

## 2012-08-09 VITALS — BP 134/84 | HR 82 | Temp 97.2°F | Ht 72.0 in | Wt 196.0 lb

## 2012-08-09 DIAGNOSIS — E871 Hypo-osmolality and hyponatremia: Secondary | ICD-10-CM

## 2012-08-09 DIAGNOSIS — J189 Pneumonia, unspecified organism: Secondary | ICD-10-CM

## 2012-08-09 LAB — BASIC METABOLIC PANEL
CO2: 28 mEq/L (ref 19–32)
Glucose, Bld: 96 mg/dL (ref 70–99)
Potassium: 4 mEq/L (ref 3.5–5.1)
Sodium: 135 mEq/L (ref 135–145)

## 2012-08-09 NOTE — Patient Instructions (Addendum)
Trial of Spiriva 1 puff daily   Follow up with Dr. Marchelle Gearing in 2  weeks Please contact office for sooner follow up if symptoms do not improve or worsen or seek emergency care

## 2012-08-13 NOTE — Assessment & Plan Note (Addendum)
Clincally improving , w/ underlying COPD -former smoker -prev PFT in 2011 with FEV1 83%, ratio ~58  cxr with mild decreased in R opacity  Will add Spiriva  Have him return in 2 weeks , advised pt on importance of keeping follow up and serial xray/ct scans for complete clearance  Will need CT chest in near future

## 2012-08-13 NOTE — Progress Notes (Signed)
  Subjective:    Patient ID: Samuel Sharp, male    DOB: 1961/11/05, 51 y.o.   MRN: [id number]  HPI 51 yo male  05/26/11  Followup RLL 6mm pulmonary nodule since Nov 2011,  tobacco abuse, emphysema/asthma in setting of dermal psoriasis for humira clearance  Not seen since Dec 2011. He had CT chest 05/18/2010. RLL 6 mm nodule is stable and unchanged; likely lymph node. REports no cough or dyspnea.  Takes ICS. REports smoking in remission but using electronic cigarettes. 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 No other complaints  08/08/12 Post Hospital follow up  Admitted 07/23/12 with URI like symptoms . CXR showed a ? mass on right lung with a post obstructive PNA vs CAP w/ effusion . He underwent a FOB ,path was neg for malignancy .  Thoracentesis Cytology on pleural fluid neg for malignant cells but obscured by acute inflammation. Tx with abx with improvement in clinical status.  Seen in consultation by CVTS but no need VATS at this time.   Since discharge he is feeling better w/ decreased cough and congestion  CXR today shows improvement in the opacity within the inferior right upper lobe/superior segment right lower lobe. No fever , diarrhea, or edema.        Review of Systems  Constitutional:   No  weight loss, night sweats,  Fevers, chills, fatigue, or  lassitude.  HEENT:   No headaches,  Difficulty swallowing,  Tooth/dental problems, or  Sore throat,                No sneezing, itching, ear ache, nasal congestion, post nasal drip,   CV:  No chest pain,  Orthopnea, PND, swelling in lower extremities, anasarca, dizziness, palpitations, syncope.   GI  No heartburn, indigestion, abdominal pain, nausea, vomiting, diarrhea, change in bowel habits, loss of appetite, bloody stools.   Resp: No shortness of breath with exertion or at rest.  No excess mucus, no productive cough,  No non-productive cough,  No coughing  up of blood.  No change in color of mucus.  No wheezing.  No chest wall deformity  Skin: no rash or lesions.  GU: no dysuria, change in color of urine, no urgency or frequency.  No flank pain, no hematuria   MS:  No joint pain or swelling.  No decreased range of motion.  No back pain.  Psych:  No change in mood or affect. No depression or anxiety.  No memory loss.          Objective:   Physical Exam  GEN: A/Ox3; pleasant , NAD, well nourished   HEENT:  [place]/AT,  EACs-clear, TMs-wnl, NOSE-clear, THROAT-clear, no lesions, no postnasal drip or exudate noted.   NECK:  Supple w/ fair ROM; no JVD; normal carotid impulses w/o bruits; no thyromegaly or nodules palpated; no lymphadenopathy.  RESP  Clear  P & A; w/o, wheezes/ rales/ or rhonchi.no accessory muscle use, no dullness to percussion  CARD:  RRR, no m/r/g  , no peripheral edema, pulses intact, no cyanosis or clubbing.  GI:   Soft & nt; nml bowel sounds; no organomegaly or masses detected.  Musco: Warm bil, no deformities or joint swelling noted.   Neuro: alert, no focal deficits noted.    Skin: Warm, no lesions or rashes          Assessment & Plan:

## 2012-08-16 ENCOUNTER — Encounter: Payer: Self-pay | Admitting: *Deleted

## 2012-08-16 ENCOUNTER — Ambulatory Visit (INDEPENDENT_AMBULATORY_CARE_PROVIDER_SITE_OTHER): Payer: BC Managed Care – PPO | Admitting: Internal Medicine

## 2012-08-16 ENCOUNTER — Encounter: Payer: Self-pay | Admitting: Internal Medicine

## 2012-08-16 VITALS — BP 124/84 | HR 84 | Temp 98.0°F | Ht 72.0 in | Wt 199.0 lb

## 2012-08-16 DIAGNOSIS — J45909 Unspecified asthma, uncomplicated: Secondary | ICD-10-CM

## 2012-08-16 DIAGNOSIS — J189 Pneumonia, unspecified organism: Secondary | ICD-10-CM

## 2012-08-16 DIAGNOSIS — J449 Chronic obstructive pulmonary disease, unspecified: Secondary | ICD-10-CM

## 2012-08-16 NOTE — Progress Notes (Signed)
Subjective:    Patient ID: Samuel Sharp, male    DOB: [birth year], 51 y.o.   MRN: [id number]  HPI 51 yo male   #- Asthma present all life. Diagnosed in childhood. Has had childhood admissions. Been on ICS since 2003 only    -  PFT 06/03/10 FEV1 3.14/ 82%, FEV1/FVC 0.54; insig resp to BD, DLCO 75%, Nl Lung Vol (done while on ICS)   - CT Nov 2011 and May 2012 with emphysema +  ##Pulmonary Embolism 2003    - Does not recollect  tpA or being on lot of oxygen or hypotension. 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 Started in [place]. Stopped 2011 by [place] derm due to nodule on ct chest 11/10/2010  - Restared humira 2012. Stopped again July 2013 fllowing empyema/pna  # #RLL Nodule 5mm on CT 11/10/2010:  likely lymph node.  CT  05/19/2011:   5 mm nodule in the right lower lobe,  adjacent to the major fissure (image 36), is unchanged  - refused followup early 2013 due to social stressors (  #TB Skin test negative    - 2005 - 2010, annual checks    OV 08/16/2012 Says that since earlyu July 2013 had cough, feeling unwell, malaise that was mild and place humira on hold. 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 Then on 07/18/12 had acute pleurisy. C 07/19/12 showed RMZ dense consolidation and loculated fluid (thoracentesis negative for cytology). Admitted. CVTS felt no need for VATS. Rx medically. Bronch apparently non-diagnostic. Discharged on 07/29/12 and finsihed 07/31/12. Followup with NP 8/15 and better. CXR 08/09/12 showed continued improvement. Since 8/15, continues to improve - pain rt pleuritic pain less, weakness better, strength improved, feels ready to go back to work. Really keen on going back to work.  However, still with symptoms - main is mild pleuritic pain early in  the morning. Says he stil lhas dyspnea though. PEF dropped from 600s to 400s after illness.  Also concerned about illlness related 20# wt loos Taking advair as schedule for past 8 years. STarted on spiriva on 08/08/12 but seems no help.  Of note; not smoking cigs but only doin occ e-cigs. 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 Financial trouble  +. Lost home +       Review of Systems  Constitutional: Negative for fever and unexpected weight change.  HENT: Negative for ear pain, nosebleeds, congestion, sore throat, rhinorrhea, sneezing, trouble swallowing, dental problem, postnasal drip and sinus pressure.   Eyes: Negative for redness and itching.  Respiratory: Positive for cough and shortness of breath. Negative for chest tightness and wheezing.   Cardiovascular: Positive for chest pain. Negative for palpitations and leg swelling.  Gastrointestinal: Negative for nausea and vomiting.  Genitourinary: Negative for dysuria.  Musculoskeletal: Negative for joint swelling.  Skin: Negative for rash.  Neurological: Negative for headaches.  Hematological: Does not bruise/bleed easily.  Psychiatric/Behavioral: Negative for dysphoric mood. The patient is not nervous/anxious.        Objective:   Physical Exam  Nursing note and vitals reviewed. Constitutional: He is oriented to person, place, and time. He appears well-developed and well-nourished. No distress.  Body mass index is 26.99 kg/(m^2).   HENT:  Head: Normocephalic and atraumatic.  Right Ear: External ear normal.  Left Ear: External ear normal.  Mouth/Throat: Oropharynx is clear and moist. No oropharyngeal exudate.  Eyes: Conjunctivae and EOM are normal. Pupils are equal, round, and reactive to light. Right eye exhibits no discharge. Left eye exhibits no discharge. No scleral icterus.  Neck: Normal range of motion. Neck supple. No JVD present. No tracheal deviation present. No  thyromegaly present.  Cardiovascular: Normal rate, regular rhythm and intact distal pulses.  Exam reveals no gallop and no friction rub.   No murmur heard. Pulmonary/Chest: Effort normal and breath sounds normal. No respiratory distress. He has no wheezes. He has no rales. He exhibits no tenderness.  Abdominal: Soft. Bowel sounds are normal. He exhibits no distension and no mass. There is no tenderness. There is no rebound and no guarding.  Musculoskeletal: Normal range of motion. He exhibits no edema and no tenderness.  Lymphadenopathy:    He has no cervical adenopathy.  Neurological: He is alert and oriented to person, place, and time. He has normal reflexes. No cranial nerve deficit. Coordination normal.  Skin: Skin is warm and dry. No rash noted. He is not diaphoretic. No erythema. No pallor.       tatoo +  Psychiatric: He has a normal mood and affect. His behavior is normal. Judgment and thought content normal.          Assessment & Plan:

## 2012-08-16 NOTE — Assessment & Plan Note (Signed)
Repeat spirometry/pft in 1 month. Last PFT was June 2011

## 2012-08-16 NOTE — Assessment & Plan Note (Signed)
Hx and clinical profile are likely c/w CAP NOS with empyema in setting of humira in end July 2013. CXR mid august 2013 shows improvement as is clinical picture. I have okayed him to go back to work and increase it judiciously. REsidual symptoms are likely due to deconditioning. He will return to see me in 1 month with repeat cxr. IF infilrate still present at a significant level, get CT chest

## 2012-08-16 NOTE — Patient Instructions (Addendum)
#  Empyema and pneumonia  - this is improving on cxr 08/09/12  - lot of symptoms that you have like fatigue, pain, shortness of breath are due to loss of fitness from this and this should gradually improve with steady conditioning - stay off humira for now  - please have followup cxr in 1 months and return to see me  #Prior smoking and asthma/copd  - in 1 month have spirometry with Jerolyn Shin  - continue spiriva and advair  #Followup  1 month with spirometry with Jerolyn Shin and CXR at folowup

## 2012-08-25 LAB — FUNGUS CULTURE W SMEAR: Fungal Smear: NONE SEEN

## 2012-09-08 LAB — AFB CULTURE WITH SMEAR (NOT AT ARMC)

## 2012-09-18 ENCOUNTER — Other Ambulatory Visit: Payer: Self-pay | Admitting: Internal Medicine

## 2012-09-18 DIAGNOSIS — H547 Unspecified visual loss: Secondary | ICD-10-CM

## 2012-09-20 ENCOUNTER — Other Ambulatory Visit: Payer: BC Managed Care – PPO

## 2012-09-20 ENCOUNTER — Ambulatory Visit
Admission: RE | Admit: 2012-09-20 | Discharge: 2012-09-20 | Disposition: A | Discharge status: Home / Self Care | Payer: BC Managed Care – PPO | Source: Ambulatory Visit | Attending: Internal Medicine | Admitting: Internal Medicine

## 2012-09-20 DIAGNOSIS — H547 Unspecified visual loss: Secondary | ICD-10-CM

## 2012-10-02 ENCOUNTER — Ambulatory Visit: Payer: BC Managed Care – PPO | Admitting: Internal Medicine

## 2012-10-29 ENCOUNTER — Other Ambulatory Visit (HOSPITAL_COMMUNITY): Payer: Self-pay | Admitting: Internal Medicine

## 2012-10-29 ENCOUNTER — Ambulatory Visit (HOSPITAL_COMMUNITY)
Admission: RE | Admit: 2012-10-29 | Discharge: 2012-10-29 | Disposition: A | Discharge status: Home / Self Care | Payer: BC Managed Care – PPO | Source: Ambulatory Visit | Attending: Internal Medicine | Admitting: Internal Medicine

## 2012-10-29 DIAGNOSIS — I10 Essential (primary) hypertension: Secondary | ICD-10-CM | POA: Insufficient documentation

## 2012-10-29 DIAGNOSIS — J438 Other emphysema: Secondary | ICD-10-CM | POA: Insufficient documentation

## 2012-10-29 DIAGNOSIS — Z87891 Personal history of nicotine dependence: Secondary | ICD-10-CM | POA: Insufficient documentation

## 2012-10-29 DIAGNOSIS — R05 Cough: Secondary | ICD-10-CM | POA: Insufficient documentation

## 2012-10-29 DIAGNOSIS — R059 Cough, unspecified: Secondary | ICD-10-CM | POA: Insufficient documentation

## 2012-11-12 ENCOUNTER — Telehealth: Payer: Self-pay | Admitting: Internal Medicine

## 2012-11-12 NOTE — Telephone Encounter (Signed)
Got report from primary care his cxr 11/08/12 shows improvement byt there is persistent opacity in medial right lung. He needs to followup. Please arrange. I think he missed appt

## 2012-11-12 NOTE — Telephone Encounter (Signed)
Returning call.Samuel Sharp ° °

## 2012-11-12 NOTE — Telephone Encounter (Signed)
[email or web address].Jennifer Castillo, CMA  

## 2012-11-12 NOTE — Telephone Encounter (Signed)
Appt made for Friday at 4pm. Carron Curie, CMA

## 2012-11-15 ENCOUNTER — Encounter: Payer: Self-pay | Admitting: Internal Medicine

## 2012-11-15 ENCOUNTER — Telehealth: Payer: Self-pay | Admitting: *Deleted

## 2012-11-15 ENCOUNTER — Ambulatory Visit (INDEPENDENT_AMBULATORY_CARE_PROVIDER_SITE_OTHER): Payer: BC Managed Care – PPO | Admitting: Internal Medicine

## 2012-11-15 VITALS — BP 132/82 | HR 70 | Temp 97.1°F | Ht 72.0 in | Wt 211.8 lb

## 2012-11-15 DIAGNOSIS — J189 Pneumonia, unspecified organism: Secondary | ICD-10-CM

## 2012-11-15 DIAGNOSIS — R0602 Shortness of breath: Secondary | ICD-10-CM

## 2012-11-15 DIAGNOSIS — J869 Pyothorax without fistula: Secondary | ICD-10-CM

## 2012-11-15 DIAGNOSIS — J449 Chronic obstructive pulmonary disease, unspecified: Secondary | ICD-10-CM

## 2012-11-15 DIAGNOSIS — F17200 Nicotine dependence, unspecified, uncomplicated: Secondary | ICD-10-CM

## 2012-11-15 DIAGNOSIS — Z23 Encounter for immunization: Secondary | ICD-10-CM

## 2012-11-15 MED ORDER — FLUTICASONE-SALMETEROL 250-50 MCG/DOSE IN AEPB: 1.0000 | INHALATION_SPRAY | Freq: Two times a day (BID) | RESPIRATORY_TRACT | Status: DC

## 2012-11-15 MED ORDER — ACLIDINIUM BROMIDE 400 MCG/ACT IN AEPB: 1.0000 | INHALATION_SPRAY | Freq: Two times a day (BID) | RESPIRATORY_TRACT | Status: DC

## 2012-11-15 NOTE — Telephone Encounter (Signed)
Patient seen today in office and did a spirometry, would like to know how this test compares to the last one he had. Patient is requesting a telephone call to speak with you in regards to this .   Dr Marchelle Gearing please advise. Thanks.

## 2012-11-15 NOTE — Patient Instructions (Addendum)
#  Asthma/Shortneess of breath  - do not know why this is not improved - have spirometry today  - continue advair  - take several samples of tudorza and use it 1 puff twice daily  - have flu shot today 11/15/2012  - we will see how you respond to adding tudorza at time of followup  #SMoking  - please work on quitting e cigs as well  #Pneumonia  - largely cleared on CXR 10/29/12 compared to July and august 2013 - glad you feel better as well - to ensure full resolution have CT chest end dec 2013 and return for followup - no humira for now  #Followup  end dec 2013  With ct chest

## 2012-11-15 NOTE — Progress Notes (Signed)
Subjective:    Patient ID: Samuel Sharp, male    DOB: 1961/04/24, 51 y.o.   MRN: [id number]  HPI 51 yo male   #- Asthma present all life. Diagnosed in childhood. Has had childhood admissions. Been on ICS since 2003 only    -  PFT 06/03/10 FEV1 3.14/ 82%, FEV1/FVC 0.54; insig resp to BD, DLCO 75%, Nl Lung Vol (done while on ICS)   - CT Nov 2011 and May 2012 with emphysema +  ##Pulmonary Embolism 2003    - Does not recollect  tpA or being on lot of oxygen or hypotension. 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 Started in [place]. Stopped 2011 by [place] derm due to nodule on ct chest 11/10/2010  - Restared humira 2012. Stopped again July 2013 fllowing empyema/pna  # #RLL Nodule 5mm on CT 11/10/2010:  likely lymph node.  CT  05/19/2011:   5 mm nodule in the right lower lobe,  adjacent to the major fissure (image 36), is unchanged  - refused followup early 2013 due to social stressors (  #TB Skin test negative    - 2005 - 2010, annual checks    OV 08/16/2012 Says that since earlyu July 2013 had cough, feeling unwell, malaise that was mild and place humira on hold. 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 Then on 07/18/12 had acute pleurisy. C 07/19/12 showed RMZ dense consolidation and loculated fluid (thoracentesis negative for cytology). Admitted. CVTS felt no need for VATS. Rx medically. Bronch apparently non-diagnostic. Discharged on 07/29/12 and finsihed 07/31/12. Followup with NP 8/15 and better. CXR 08/09/12 showed continued improvement. Since 8/15, continues to improve - pain rt pleuritic pain less, weakness better, strength improved, feels ready to go back to work. Really keen on going back to work.  However, still with symptoms - main is mild pleuritic pain early in  the morning. Says he stil lhas dyspnea though. PEF dropped from 600s to 400s after illness.  Also concerned about illlness related 20# wt loos Taking advair as schedule for past 8 years. STarted on spiriva on 08/08/12 but seems no help.  Of note; not smoking cigs but only doin occ e-cigs. 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 Financial trouble  +. Lost home +   #Empyema and pneumonia  - this is improving on cxr 08/09/12  - lot of symptoms that you have like fatigue, pain, shortness of breath are due to loss of fitness from this and this should gradually improve with steady conditioning  - stay off humira for now  - please have followup cxr in 1 months and return to see me  #Prior smoking and asthma/copd  - in 1 month have spirometry with Jerolyn Shin  - continue spiriva and advair  #Followup  1 month with spirometry with Jerolyn Shin and CXR at [facility]up   OV 11/15/2012  Followup pneumonia, copd/asthma, and smoking  Pneumonia: He did not return as scheduled for fol[facility]owup (see above). He did see PMD earlier this month and CXR 10/29/12 - continued improvement since August 2013 but still has RLL infiltrate medial aspect; mild persistence.   COPD/asthma: We did Spirometry today fev1 2.48L/61%,  Ratio 52 and c/w moderate obstruction and this is worse than his baseline. He is having some cough and dyspnea which is worse than baseline but is improved compared to his pneumonia. He is not taking spiriva. He is only doing ICS/LABA.  He is keen on restarting humira for his worsening psoriasis but I have asked him to hold off.   Smoking: He continues to smoke e-cigs  Past, Family, Social reviewed: no change since last visit   Review of Systems  Constitutional: Negative for fever and unexpected weight change.  HENT: Negative for ear pain, nosebleeds, congestion, sore throat, rhinorrhea, sneezing, trouble swallowing, dental problem, postnasal drip and sinus  pressure.   Eyes: Negative for redness and itching.  Respiratory: Positive for cough and shortness of breath. Negative for chest tightness and wheezing.   Cardiovascular: Negative for palpitations and leg swelling.  Gastrointestinal: Negative for nausea and vomiting.  Genitourinary: Negative for dysuria.  Musculoskeletal: Negative for joint swelling.  Skin: Negative for rash.  Neurological: Negative for headaches.  Hematological: Does not bruise/bleed easily.  Psychiatric/Behavioral: Negative for dysphoric mood. The patient is not nervous/anxious.        Objective:   Physical Exam Nursing note and vitals reviewed. Constitutional: He is oriented to person, place, and time. He appears well-developed and well-nourished. No distress.       Body mass index is 28.73 kg/(m^2).  HENT:  Head: Normocephalic and atraumatic.  Right Ear: External ear normal.  Left Ear: External ear normal.  Mouth/Throat: Oropharynx is clear and moist. No oropharyngeal exudate.  Eyes: Conjunctivae and EOM are normal. Pupils are equal, round, and reactive to light. Right eye exhibits no discharge. Left eye exhibits no discharge. No scleral icterus.  Neck: Normal range of motion. Neck supple. No JVD present. No tracheal deviation present. No thyromegaly present.  Cardiovascular: Normal rate, regular rhythm and intact distal pulses.  Exam reveals no gallop and no friction rub.   No murmur heard. Pulmonary/Chest: Effort normal and breath sounds normal. No respiratory distress. He has no wheezes. He has no rales. He exhibits no tenderness.  Abdominal: Soft. Bowel sounds are normal. He exhibits no distension and no mass. There is no tenderness. There is no rebound and no guarding.  Musculoskeletal: Normal range of motion. He exhibits no edema and no tenderness.  Lymphadenopathy:    He has no cervical adenopathy.  Neurological: He is alert and oriented to person, place, and time. He has normal reflexes. No cranial nerve  deficit. Coordination normal.  Skin: Skin is warm and dry. No rash noted. He is not diaphoretic. No erythema. No pallor.       tatoo +  Psychiatric: He has a normal mood and affect. His behavior is normal. Judgment and thought content normal.           Assessment & Plan:

## 2012-11-19 ENCOUNTER — Telehealth: Payer: Self-pay | Admitting: Internal Medicine

## 2012-11-19 NOTE — Telephone Encounter (Signed)
lmomtcb x1 

## 2012-11-19 NOTE — Telephone Encounter (Signed)
Duplicate message see phone note 11/15/12

## 2012-11-19 NOTE — Telephone Encounter (Signed)
Current spirometry is 20% down from his baseline and could reflect fact he was not taking spiriva. Let us see what his numbers are in response to [place]. Will assess at next visit

## 2012-11-20 NOTE — Telephone Encounter (Signed)
lmomtcb x 2  

## 2012-11-22 NOTE — Telephone Encounter (Signed)
Pt aware of spirometry results per MR and will continue on the Tudorza until next OV when MR will reasess pt's response to the [place]. Pt verbalized understanding.

## 2012-11-24 NOTE — Assessment & Plan Note (Signed)
#  Pneumonia  - largely cleared on CXR 10/29/12 compared to July and august 2013 - glad you feel better as well - to ensure full resolution have CT chest end dec 2013 and return for followup - no humira for now  #Followup  end dec 2013  With ct chest

## 2012-11-24 NOTE — Assessment & Plan Note (Signed)
#  SMoking  - please work on quitting e cigs as well

## 2012-11-24 NOTE — Assessment & Plan Note (Signed)
#  Asthma/Shortneess of breath  - do not know why this is not improved - have spirometry today  - continue advair  - take several samples of tudorza and use it 1 puff twice daily  - have flu shot today 11/15/2012  - we will see how you respond to adding tudorza at time of followup

## 2012-12-30 ENCOUNTER — Ambulatory Visit (INDEPENDENT_AMBULATORY_CARE_PROVIDER_SITE_OTHER)
Admission: RE | Admit: 2012-12-30 | Discharge: 2012-12-30 | Disposition: A | Discharge status: Home / Self Care | Payer: BC Managed Care – PPO | Source: Ambulatory Visit | Attending: Internal Medicine | Admitting: Internal Medicine

## 2012-12-30 DIAGNOSIS — R0602 Shortness of breath: Secondary | ICD-10-CM

## 2012-12-30 DIAGNOSIS — J869 Pyothorax without fistula: Secondary | ICD-10-CM

## 2012-12-30 DIAGNOSIS — J189 Pneumonia, unspecified organism: Secondary | ICD-10-CM

## 2013-01-08 ENCOUNTER — Telehealth: Payer: Self-pay | Admitting: Internal Medicine

## 2013-01-08 NOTE — Telephone Encounter (Signed)
Reviewed his CT chest  - marked improvement in pneumonia findings down to a small scar  - old 6mm right lung nodule still present from 2011 and 2012  - probably needs CT in a  Year   - also he needs to come into discuss how he is doing on tudorza and at that visit needs office spirometry. I see no fu appt scheduled

## 2013-01-09 NOTE — Telephone Encounter (Signed)
Pt advised and he states he is at work and he will call back for appt. Carron Curie, CMA

## 2013-01-09 NOTE — Telephone Encounter (Signed)
[email or web address].Jennifer Castillo, CMA  

## 2013-09-02 ENCOUNTER — Ambulatory Visit (INDEPENDENT_AMBULATORY_CARE_PROVIDER_SITE_OTHER): Payer: BC Managed Care – PPO | Admitting: Cardiovascular Disease

## 2013-09-02 ENCOUNTER — Encounter: Payer: Self-pay | Admitting: Cardiovascular Disease

## 2013-09-02 VITALS — BP 172/106 | HR 57 | Ht 72.0 in | Wt 213.0 lb

## 2013-09-02 DIAGNOSIS — E785 Hyperlipidemia, unspecified: Secondary | ICD-10-CM

## 2013-09-02 DIAGNOSIS — I2510 Atherosclerotic heart disease of native coronary artery without angina pectoris: Secondary | ICD-10-CM

## 2013-09-02 DIAGNOSIS — I10 Essential (primary) hypertension: Secondary | ICD-10-CM

## 2013-09-02 DIAGNOSIS — J984 Other disorders of lung: Secondary | ICD-10-CM

## 2013-09-02 NOTE — Assessment & Plan Note (Signed)
Cholesterol is at goal.  Continue current dose of statin and diet Rx.  No myalgias or side effects.  F/U  LFT's in 6 months. No results found for this basename: LDLCALC  See HPI

## 2013-09-02 NOTE — Assessment & Plan Note (Signed)
Not clear that this is adequately Rx  He will take home readings and see me in 6 weeks to see if meds need to be titrated

## 2013-09-02 NOTE — Patient Instructions (Signed)
Your physician recommends that you schedule a follow-up appointment in:  NEXT AVAILABLE  WITH DR [facility] Your physician recommends that you continue on your current medications as directed. Please refer to the Current Medication list given to you today. Your physician has requested that you regularly monitor and record your blood pressure readings at home. Please use the same machine at the same time of day to check your readings and record them to bring to your follow-up visit.

## 2013-09-02 NOTE — Assessment & Plan Note (Signed)
F/U pulmonary for surveilance CT as recommended by radiology

## 2013-09-02 NOTE — Progress Notes (Signed)
Patient ID: Samuel Sharp, male   DOB: [birth year], 52 y.o.   MRN: [id number] 44 you smoker with COPD and dyspnea  Sees our lung doctors   CT scan done 11/13 to follow lung nodules.  Showed some coronary artery calcification Reviewed CT and one speck of calcium in proximal RCA and some sporadic calcium in mid and distal LAD  Overall score would likely be under 100.  Labs from primary reviewed 07/04/13  LDL 111 TC 203 HDL 63  LFTls normal and A1c 5.0  Carotid duplex 09/20/12 no significant disease  Quit smoking 3 years ago.  ? 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  No chest pain Feels great.  Works out on [employer]  IMPRESSION: 1. Right lower lobe nodule measuring 19 mm x 25 mm (image number 49 series 3) is most compatible with round atelectasis given the prior pneumonia and loculated effusion. 2. 6 mm superior segment right lower lobe pulmonary nodule (image number 35 series 3). If the patient is at high risk for bronchogenic carcinoma, follow-up chest CT at 6-12 months is recommended. If the patient is at low risk for bronchogenic carcinoma, follow-up chest CT at 12 months is recommended. This recommendation follows the consensus statement: Guidelines for Management of Small Pulmonary Nodules Detected on CT Scans: A Statement from the Fleischner Society as published in Radiology 2005; 237:395-400. 3. Scattered pulmonary parenchymal scarring associated with prior pneumonia.Centrilobular emphysema. 4. Atherosclerosis and coronary artery disease. 5. Small ascending aortic aneurysm maximally measuring 42 mm.    ROS: Denies fever, malais, weight loss, blurry vision, decreased visual acuity, cough, sputum, SOB, hemoptysis, pleuritic pain, palpitaitons, heartburn, abdominal pain, melena, lower extremity edema, claudication, or rash.  All other systems reviewed and negative   General: Affect appropriate Healthy:  appears stated  age HEENT: normal Neck supple with no adenopathy JVP normal no bruits no thyromegaly Lungs clear with no wheezing and good diaphragmatic motion Heart:  S1/S2 no murmur,rub, gallop or click PMI normal Abdomen: benighn, BS positve, no tenderness, no AAA no bruit.  No HSM or HJR Distal pulses intact with no bruits No edema Neuro non-focal Skin warm and dry multiple tatoos  No muscular weakness  Medications Current Outpatient Prescriptions  Medication Sig Dispense Refill  . clobetasol ointment (TEMOVATE) 0.05 % Apply as needed      . Flaxseed, Linseed, (EQL FLAX SEED OIL) 1000 MG CAPS Take 1,000 mg by mouth daily.      . Fluticasone-Salmeterol (ADVAIR DISKUS) 250-50 MCG/DOSE AEPB Inhale 1 puff into the lungs every 12 (twelve) hours.  60 each  0  . HUMIRA 40 MG/0.8ML injection ON HOLD      . LORazepam (ATIVAN) 2 MG tablet Take 2 mg by mouth 3 (three) times daily. 1 tablet 3 times a day      . Multiple Vitamins-Minerals (MULTIVITAMIN WITH MINERALS) tablet Take 1 tablet by mouth daily.      . Omega-3 Fatty Acids (FISH OIL) 1000 MG CAPS Take 1,000 mg by mouth daily.       No current facility-administered medications for this visit.    Allergies Ace inhibitors and Haloperidol lactate  Family History: Family History  Problem Relation Age of Onset  . Emphysema Father   . Asthma Mother   . Allergies Mother     Social History: History   Social History  . Marital Status: Single    Spouse Name: N/A    Number  of Children: N/A  . Years of Education: N/A   Occupational History  . Not on file.   Social History Main Topics  . Smoking status: Former Smoker -- 1.00 packs/day for 35 years    Quit date: 03/25/2010  . Smokeless tobacco: Never Used  . Alcohol Use: Yes     Comment: every other day, 4-5drinks  . Drug Use: No  . Sexual Activity: No   Other Topics Concern  . Not on file   Social History Narrative  . No narrative on file    Electrocardiogram:  07/23/12  SR rate 89   Normal    Today NSR rate 57 normal   Assessment and Plan

## 2013-09-02 NOTE — Assessment & Plan Note (Signed)
Calcium on CT.  No symptoms and normal ECG  Overall burden of calcium low. No indication for ETT.  Continue ASA and statin  F/U in a year

## 2013-09-15 ENCOUNTER — Encounter: Payer: Self-pay | Admitting: Internal Medicine

## 2013-10-15 ENCOUNTER — Ambulatory Visit: Payer: BC Managed Care – PPO | Admitting: Cardiovascular Disease

## 2013-11-07 ENCOUNTER — Other Ambulatory Visit: Payer: Self-pay | Admitting: Emergency Medicine

## 2013-11-07 ENCOUNTER — Telehealth: Payer: Self-pay | Admitting: *Deleted

## 2013-11-07 MED ORDER — LORAZEPAM 2 MG PO TABS: 2.0000 mg | ORAL_TABLET | Freq: Three times a day (TID) | ORAL | Status: DC

## 2013-11-07 NOTE — Telephone Encounter (Signed)
Refill: Ativan 2mg   Says pharmacy is telling him they have faxed [place] with no response?  Asking can we call in refill

## 2013-11-10 NOTE — Telephone Encounter (Signed)
FAXED REFILL FAXED FROM [facility]

## 2013-12-01 ENCOUNTER — Other Ambulatory Visit: Payer: Self-pay | Admitting: Internal Medicine

## 2013-12-01 MED ORDER — METOPROLOL TARTRATE 25 MG PO TABS: 25.0000 mg | ORAL_TABLET | Freq: Two times a day (BID) | ORAL | Status: DC

## 2013-12-08 ENCOUNTER — Encounter: Payer: Self-pay | Admitting: Internal Medicine

## 2013-12-08 ENCOUNTER — Other Ambulatory Visit: Payer: Self-pay | Admitting: Emergency Medicine

## 2013-12-08 MED ORDER — METOPROLOL TARTRATE 25 MG PO TABS: 25.0000 mg | ORAL_TABLET | Freq: Two times a day (BID) | ORAL | Status: DC

## 2013-12-08 MED ORDER — LORAZEPAM 2 MG PO TABS: 2.0000 mg | ORAL_TABLET | Freq: Three times a day (TID) | ORAL | Status: DC | PRN

## 2013-12-09 ENCOUNTER — Ambulatory Visit: Payer: Self-pay | Admitting: Emergency Medicine

## 2013-12-23 ENCOUNTER — Ambulatory Visit (INDEPENDENT_AMBULATORY_CARE_PROVIDER_SITE_OTHER): Payer: BC Managed Care – PPO | Admitting: Emergency Medicine

## 2013-12-23 ENCOUNTER — Encounter: Payer: Self-pay | Admitting: Emergency Medicine

## 2013-12-23 VITALS — BP 178/108 | HR 74 | Temp 98.0°F | Resp 18 | Ht 73.0 in | Wt 232.0 lb

## 2013-12-23 DIAGNOSIS — E559 Vitamin D deficiency, unspecified: Secondary | ICD-10-CM

## 2013-12-23 DIAGNOSIS — I10 Essential (primary) hypertension: Secondary | ICD-10-CM

## 2013-12-23 DIAGNOSIS — E782 Mixed hyperlipidemia: Secondary | ICD-10-CM

## 2013-12-23 DIAGNOSIS — Z79899 Other long term (current) drug therapy: Secondary | ICD-10-CM

## 2013-12-23 DIAGNOSIS — F411 Generalized anxiety disorder: Secondary | ICD-10-CM

## 2013-12-23 LAB — HEPATIC FUNCTION PANEL
ALT: 35 U/L (ref 0–53)
Alkaline Phosphatase: 66 U/L (ref 39–117)
Indirect Bilirubin: 0.5 mg/dL (ref 0.0–0.9)
Total Protein: 7 g/dL (ref 6.0–8.3)

## 2013-12-23 LAB — LIPID PANEL
Cholesterol: 203 mg/dL — ABNORMAL HIGH (ref 0–200)
LDL Cholesterol: 98 mg/dL (ref 0–99)
Triglycerides: 229 mg/dL — ABNORMAL HIGH (ref <150)

## 2013-12-23 LAB — BASIC METABOLIC PANEL
CO2: 24 mEq/L (ref 19–32)
Chloride: 103 mEq/L (ref 96–112)
Creat: 1.07 mg/dL (ref 0.50–1.35)
Sodium: 136 mEq/L (ref 135–145)

## 2013-12-23 LAB — CBC WITH DIFFERENTIAL/PLATELET
Basophils Relative: 1 % (ref 0–1)
Eosinophils Relative: 6 % — ABNORMAL HIGH (ref 0–5)
HCT: 38.7 % — ABNORMAL LOW (ref 39.0–52.0)
Hemoglobin: 13.6 g/dL (ref 13.0–17.0)
Lymphocytes Relative: 30 % (ref 12–46)
MCHC: 35.1 g/dL (ref 30.0–36.0)
Monocytes Absolute: 0.9 10*3/uL (ref 0.1–1.0)
Monocytes Relative: 13 % — ABNORMAL HIGH (ref 3–12)
Neutro Abs: 3.4 10*3/uL (ref 1.7–7.7)
WBC: 6.7 10*3/uL (ref 4.0–10.5)

## 2013-12-23 NOTE — Progress Notes (Signed)
Subjective:    Patient ID: Samuel Sharp, male    DOB: 06-12-1961, 52 y.o.   MRN: [id number]  HPI Comments: 52 YO MALE presents for 3 month F/U for HTN, Cholesterol, Pre-Dm, D. Deficient. He is noncompliant with f/u appointments. He has been checking BP at home and averages between 150-70s/80-90s. He is not exercising routinely. He has not been eating healthy and has gained weight over last several months. LAST LABS BUN 32 T 203 LDL 11 HDL 63 D 75 A1C 5.5  He notes his anxiety is controlled with lorazepam and needs a refill.  He restarted Humira for psoriasis by Dr Emily Filbert with BUN/ Creat returning to normal. He has been back on RX x 3 weeks.   Hyperlipidemia  Hypertension Associated symptoms include anxiety.  Asthma His past medical history is significant for asthma.  Anxiety Patient reports no nervous/anxious behavior.   His past medical history is significant for asthma.   Current Outpatient Prescriptions on File Prior to Visit  Medication Sig Dispense Refill  . atorvastatin (LIPITOR) 40 MG tablet Take 40 mg by mouth daily.      . clobetasol ointment (TEMOVATE) 0.05 % Apply as needed      . enalapril (VASOTEC) 20 MG tablet Take 20 mg by mouth 2 (two) times daily.       . Flaxseed, Linseed, (EQL FLAX SEED OIL) 1000 MG CAPS Take 1,000 mg by mouth daily.      . Fluticasone-Salmeterol (ADVAIR DISKUS) 250-50 MCG/DOSE AEPB Inhale 1 puff into the lungs every 12 (twelve) hours.  60 each  0  . HUMIRA 40 MG/0.8ML injection ON HOLD      . metoprolol tartrate (LOPRESSOR) 25 MG tablet Take 1 tablet (25 mg total) by mouth 2 (two) times daily.  60 tablet  0  . Multiple Vitamins-Minerals (MULTIVITAMIN WITH MINERALS) tablet Take 1 tablet by mouth daily.      . Omega-3 Fatty Acids (FISH OIL) 1000 MG CAPS Take 1,000 mg by mouth daily.       No current facility-administered medications on file prior to visit.   ALLERGIES Ace inhibitors; Haldol; and Haloperidol lactate  Past Medical History   Diagnosis Date  . Asthma     Present all life. Dx as child. Been in [facility] since 2003--pft 06/03/10 FEV1 3.14/ 82%, FEV1/FVC 0.54; insig resp to BD, DLCO 75%, ni lUNG  Vol  . Hyperlipidemia   . Hypertension     on lisinopril 2002/2003--changed ARB march 2011  . Palpitations     started atenolol since 2003. recollects hx of holter. 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 informed it "Not a dangerous condition"-changed to lopressor 12/15/2010  . Pulmonary embolism     does not recollect tpA or being on lot of o2 or hypotension. 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 Psoriasis     on enbre;l since 005. started in [place]. Stopped 2011 by [place] derm due to nodule on chest  . Detached retina   . Lung nodule     5mm on ct 11/10/10- likely lymph node  . History of TB skin testing     negative 2005-2010       Review of Systems  Psychiatric/Behavioral: Negative for agitation. The patient is not nervous/anxious.   All other systems reviewed and are negative.   BP 178/108  Pulse 74  Temp(Src) 98 F (36.7 C) (Temporal)  Resp 18  Ht 6\' 1"  (1.854 m)  Wt 232 lb (105.235 kg)  BMI 30.62 kg/m2     Objective:   Physical Exam  Nursing note and vitals reviewed. Constitutional: He is oriented to person, place, and time. He appears well-developed and well-nourished.  HENT:  Head: Normocephalic and atraumatic.  Right Ear: External ear normal.  Left Ear: External ear normal.  Nose: Nose normal.  Mouth/Throat: No oropharyngeal exudate.  Eyes: Conjunctivae and EOM are normal.  Neck: Normal range of motion. Neck supple. No JVD present. No thyromegaly present.  Cardiovascular: Normal rate, regular rhythm, normal heart sounds and intact distal pulses.   Pulmonary/Chest: Effort normal and breath sounds normal.  Abdominal: Soft. Bowel sounds are normal. He exhibits no distension and no mass. There is no tenderness. There is no rebound and no guarding.   Musculoskeletal: Normal range of motion. He exhibits no edema and no tenderness.  Lymphadenopathy:    He has no cervical adenopathy.  Neurological: He is alert and oriented to person, place, and time. He has normal reflexes. No cranial nerve deficit. Coordination normal.  Skin: Skin is warm and dry.  Psychiatric: He has a normal mood and affect. His behavior is normal. Judgment and thought content normal.          Assessment & Plan:  1.  3 month F/U for HTN, Cholesterol, Pre-Dm, D. Deficient. Needs healthy diet, cardio QD and obtain healthy weight. Check Labs, Check BP if >130/80 call office. D/C lisinopril and start aZOR 5/40 1 QD sx 35#, call next week with results 2. Anxiety- refill Lorazepam 3. Restart of Humira by [facility] for psoriasis- recheck BMET with HX of BUN elevation in past.

## 2013-12-23 NOTE — Patient Instructions (Signed)
Hypertension Hypertension is another name for high blood pressure. High blood pressure may mean that your heart needs to work harder to pump blood. Blood pressure consists of two numbers, which includes a higher number over a lower number (example: 110/72). HOME CARE   Make lifestyle changes as told by your doctor. This may include weight loss and exercise.  Take your blood pressure medicine every day.  Limit how much salt you use.  Stop smoking if you smoke.  Do not use drugs.  Talk to your doctor if you are using decongestants or birth control pills. These medicines might make blood pressure higher.  Females should not drink more than 1 alcoholic drink per day. Males should not drink more than 2 alcoholic drinks per day.  See your doctor as told. GET HELP RIGHT AWAY IF:   You have a blood pressure reading with a top number of 180 or higher.  You get a very bad headache.  You get blurred or changing vision.  You feel confused.  You feel weak, numb, or faint.  You get chest or belly (abdominal) pain.  You throw up (vomit).  You cannot breathe very well. MAKE SURE YOU:   Understand these instructions.  Will watch your condition.  Will get help right away if you are not doing well or get worse. Document Released: 05/29/2008 Document Revised: 03/04/2012 Document Reviewed: 05/29/2008 ExitCare Patient Information 2014 [employer].  

## 2013-12-24 LAB — VITAMIN D 25 HYDROXY (VIT D DEFICIENCY, FRACTURES): Vit D, 25-Hydroxy: 41 ng/mL (ref 30–89)

## 2013-12-24 MED ORDER — LORAZEPAM 2 MG PO TABS: 2.0000 mg | ORAL_TABLET | Freq: Three times a day (TID) | ORAL | Status: DC

## 2013-12-29 ENCOUNTER — Other Ambulatory Visit: Payer: Self-pay | Admitting: Internal Medicine

## 2014-01-13 ENCOUNTER — Other Ambulatory Visit: Payer: Self-pay | Admitting: *Deleted

## 2014-01-13 MED ORDER — AMLODIPINE-OLMESARTAN 5-40 MG PO TABS: 1.0000 | ORAL_TABLET | Freq: Every day | ORAL | Status: DC

## 2014-01-23 ENCOUNTER — Other Ambulatory Visit: Payer: Self-pay | Admitting: Emergency Medicine

## 2014-01-23 DIAGNOSIS — R899 Unspecified abnormal finding in specimens from other organs, systems and tissues: Secondary | ICD-10-CM

## 2014-01-23 DIAGNOSIS — R6889 Other general symptoms and signs: Secondary | ICD-10-CM

## 2014-01-26 ENCOUNTER — Other Ambulatory Visit: Payer: Self-pay

## 2014-01-26 ENCOUNTER — Other Ambulatory Visit: Payer: Self-pay | Admitting: Emergency Medicine

## 2014-01-26 DIAGNOSIS — F411 Generalized anxiety disorder: Secondary | ICD-10-CM

## 2014-01-26 DIAGNOSIS — I10 Essential (primary) hypertension: Secondary | ICD-10-CM

## 2014-01-26 DIAGNOSIS — R899 Unspecified abnormal finding in specimens from other organs, systems and tissues: Secondary | ICD-10-CM

## 2014-01-26 LAB — BASIC METABOLIC PANEL WITH GFR
BUN: 28 mg/dL — ABNORMAL HIGH (ref 6–23)
CHLORIDE: 106 meq/L (ref 96–112)
CO2: 23 meq/L (ref 19–32)
CREATININE: 1.37 mg/dL — AB (ref 0.50–1.35)
Calcium: 9.4 mg/dL (ref 8.4–10.5)
GFR, Est African American: 67 mL/min
GFR, Est Non African American: 58 mL/min — ABNORMAL LOW
GLUCOSE: 79 mg/dL (ref 70–99)
Potassium: 4.5 mEq/L (ref 3.5–5.3)
Sodium: 140 mEq/L (ref 135–145)

## 2014-01-26 MED ORDER — LORAZEPAM 2 MG PO TABS: 2.0000 mg | ORAL_TABLET | Freq: Three times a day (TID) | ORAL | Status: DC

## 2014-02-09 ENCOUNTER — Other Ambulatory Visit: Payer: Self-pay | Admitting: *Deleted

## 2014-02-09 MED ORDER — ATORVASTATIN CALCIUM 40 MG PO TABS: 40.0000 mg | ORAL_TABLET | Freq: Every day | ORAL | Status: DC

## 2014-02-22 ENCOUNTER — Other Ambulatory Visit: Payer: Self-pay | Admitting: Emergency Medicine

## 2014-03-17 ENCOUNTER — Other Ambulatory Visit: Payer: Self-pay | Admitting: Emergency Medicine

## 2014-04-07 ENCOUNTER — Encounter: Payer: Self-pay | Admitting: Emergency Medicine

## 2014-04-07 ENCOUNTER — Ambulatory Visit (INDEPENDENT_AMBULATORY_CARE_PROVIDER_SITE_OTHER): Payer: BC Managed Care – PPO | Admitting: Emergency Medicine

## 2014-04-07 VITALS — BP 154/86 | HR 86 | Temp 98.6°F | Resp 18 | Ht 73.0 in | Wt 234.0 lb

## 2014-04-07 DIAGNOSIS — E782 Mixed hyperlipidemia: Secondary | ICD-10-CM

## 2014-04-07 DIAGNOSIS — I10 Essential (primary) hypertension: Secondary | ICD-10-CM

## 2014-04-07 DIAGNOSIS — R6889 Other general symptoms and signs: Secondary | ICD-10-CM

## 2014-04-07 LAB — CBC WITH DIFFERENTIAL/PLATELET
Basophils Absolute: 0 10*3/uL (ref 0.0–0.1)
Basophils Relative: 0 % (ref 0–1)
Eosinophils Absolute: 0.6 10*3/uL (ref 0.0–0.7)
Eosinophils Relative: 8 % — ABNORMAL HIGH (ref 0–5)
HCT: 38.3 % — ABNORMAL LOW (ref 39.0–52.0)
Hemoglobin: 13.1 g/dL (ref 13.0–17.0)
Lymphocytes Relative: 28 % (ref 12–46)
Lymphs Abs: 2.1 10*3/uL (ref 0.7–4.0)
MCH: 32 pg (ref 26.0–34.0)
MCHC: 34.2 g/dL (ref 30.0–36.0)
MCV: 93.4 fL (ref 78.0–100.0)
Monocytes Absolute: 0.8 10*3/uL (ref 0.1–1.0)
Monocytes Relative: 10 % (ref 3–12)
NEUTROS ABS: 4.1 10*3/uL (ref 1.7–7.7)
NEUTROS PCT: 54 % (ref 43–77)
Platelets: 277 10*3/uL (ref 150–400)
RBC: 4.1 MIL/uL — AB (ref 4.22–5.81)
RDW: 12.9 % (ref 11.5–15.5)
WBC: 7.6 10*3/uL (ref 4.0–10.5)

## 2014-04-07 NOTE — Patient Instructions (Signed)
Psoriasis Psoriasis is a long-lasting (chronic) skin problem. It can cause red bumps, a rash, scales that flake off, bleeding, and joint pain (arthritis). Psoriasis often affects the elbows, knees, groin, genitals, arms, legs, scalp, and nails. Psoriasis cannot be passed from person to person (not contagious).  HOME CARE Only take medicine as told by your doctor.Arthritis, Nonspecific Arthritis is pain, redness, warmth, or puffiness (inflammation) of a joint. The joint may be stiff or hurt when you move it. One or more joints may be affected. There are many types of arthritis. Your doctor may not know what type you have right away. The most common cause of arthritis is wear and tear on the joint (osteoarthritis). HOME CARE   Only take medicine as told by your doctor.  Rest the joint as much as possible.  Raise (elevate) your joint if it is puffy.  Use crutches if the painful joint is in your leg.  Drink enough fluids to keep your pee (urine) clear or pale yellow.  Follow your doctor's diet instructions.  Use cold packs for very bad joint pain for 10 to 15 minutes every hour. Ask your doctor if it is okay for you to use hot packs.  Exercise as told by your doctor.  Take a warm shower if you have stiffness in the morning.  Move your sore joints throughout the day. GET HELP RIGHT AWAY IF:   You have a fever.  You have very bad joint pain, puffiness, or redness.  You have many joints that are painful and puffy.  You are not getting better with treatment.  You have very bad back pain or leg weakness.  You cannot control when you poop (bowel movement) or pee (urinate).  You do not feel better in 24 hours or are getting worse.  You are having side effects from your medicine. MAKE SURE YOU:   Understand these instructions.  Will watch your condition.  Will get help right away if you are not doing well or get worse. Document Released: 03/07/2010 Document Revised: 06/11/2012  Document Reviewed: 03/07/2010 [facility] Patient Information 2014 [place], [place].    Keep all doctor visits as told. You may need to try many treatments to find what works for you.  Avoid sunburn, cuts, scrapes, alcohol, smoking, and stress.  Wear gloves when you wash dishes, clean, or go outside in the cold.  Keep the air moist and cool in your home. You can use a humidifier.  Put lotion on right after a bath or shower.  Avoid long, hot baths and showers. Do not use a lot of soap.  Drink enough fluids to keep your pee (urine) clear or pale yellow. GET HELP RIGHT AWAY IF:   You have pain in the affected areas.  You have bleeding that does not stop in the affected areas.  You have more redness or warmth in the affected areas.  You have painful or stiff joints.  You feel depressed.  You have a fever. MAKE SURE YOU:  Understand these instructions.  Will watch your condition.  Will get help right away if you are not doing well or get worse. Document Released: 01/18/2005 Document Revised: 03/04/2012 Document Reviewed: 06/09/2011 [facility] Patient Information 2014 [place]Care, [place]LL[place].

## 2014-04-07 NOTE — Progress Notes (Signed)
Subjective:    Patient ID: Samuel Sharp, male    DOB: 10/15/1961, 53 y.o.   MRN: [id number]  HPI Comments: 53 yo male presents for 3 month F/U for HTN, Cholesterol, D. deficient He notes BP is better at home. He stopped Humira about 2 months ago which he takes for psoriasis. He notes Knee and elbow pain is better on humira. He notes BUN was 29 at last OV. He is eating healthy and exercising routinely.  CHOL         203   12/23/2013 HDL           59   12/23/2013 LDLCALC       98   12/23/2013 TRIG         229   12/23/2013 CHOLHDL      3.4   12/23/2013 ALT           35   12/23/2013 AST           24   12/23/2013 ALKPHOS       66   12/23/2013 BILITOT      0.6   12/23/2013 CREATININE     1.37   01/26/2014 BUN              28   01/26/2014 NA              140   01/26/2014 K               4.5   01/26/2014 CL              106   01/26/2014 CO2              23   01/26/2014  Hypertension  Hyperlipidemia     Medication List       This list is accurate as of: 04/07/14  5:04 PM.  Always use your most recent med list.               ADVAIR DISKUS 250-50 MCG/DOSE Aepb  Generic drug:  Fluticasone-Salmeterol  USE 1 PUFF TWICE A DAY     amLODipine-olmesartan 5-40 MG per tablet  Commonly known as:  AZOR  Take 1 tablet by mouth daily.     atorvastatin 40 MG tablet  Commonly known as:  LIPITOR  Take 1 tablet (40 mg total) by mouth daily.     clobetasol ointment 0.05 %  Commonly known as:  TEMOVATE  Apply as needed     EQL FLAX SEED OIL 1000 MG Caps  Take 1,000 mg by mouth daily.     Fish Oil 1000 MG Caps  Take 1,000 mg by mouth daily.     HUMIRA 40 MG/0.8ML injection  Generic drug:  adalimumab  ON HOLD     LORazepam 2 MG tablet  Commonly known as:  ATIVAN  TAKE ONE TABLET BY MOUTH THREE TIMES DAILY AS NEEDED     metoprolol tartrate 25 MG tablet  Commonly known as:  LOPRESSOR  Take 1 tablet (25 mg total) by mouth 2 (two) times daily.     multivitamin with minerals tablet  Take 1 tablet  by mouth daily.           Review of Systems  Musculoskeletal: Positive for arthralgias.  All other systems reviewed and are negative.  BP 154/86  Pulse 86  Temp(Src) 98.6 F (37 C) (Temporal)  Resp 18  Ht 6\' 1"  (1.854 m)  Wt 234 lb (106.142 kg)  BMI 30.88  kg/m2  SpO2 97% Recheck 142/ 78    Objective:   Physical Exam  Nursing note and vitals reviewed. Constitutional: He is oriented to person, place, and time. He appears well-developed and well-nourished.  HENT:  Head: Normocephalic and atraumatic.  Right Ear: External ear normal.  Left Ear: External ear normal.  Nose: Nose normal.  Eyes: Conjunctivae and EOM are normal.  Neck: Normal range of motion. Neck supple. No JVD present. No thyromegaly present.  Cardiovascular: Normal rate, regular rhythm, normal heart sounds and intact distal pulses.   Pulmonary/Chest: Effort normal and breath sounds normal.  Abdominal: Soft. Bowel sounds are normal. He exhibits no distension and no mass. There is no tenderness. There is no rebound and no guarding.  Musculoskeletal: Normal range of motion. He exhibits no edema and no tenderness.  Lymphadenopathy:    He has no cervical adenopathy.  Neurological: He is alert and oriented to person, place, and time. He has normal reflexes. No cranial nerve deficit. Coordination normal.  Skin: Skin is warm and dry.  Psychiatric: He has a normal mood and affect. His behavior is normal. Judgment and thought content normal.          Assessment & Plan:  1.  3 month F/U for HTN, Cholesterol, D. Deficient. Needs healthy diet, cardio QD and obtain healthy weight. Check Labs, Check BP if >130/80 call office  2. Arthralgias with psoriasis- Consider referral to Rheum if BUN elevated and cannot restart Humira for psoriasis for skin and questionable psoriatic arthritis

## 2014-04-08 LAB — HEPATIC FUNCTION PANEL
ALBUMIN: 4.1 g/dL (ref 3.5–5.2)
ALK PHOS: 65 U/L (ref 39–117)
ALT: 19 U/L (ref 0–53)
AST: 19 U/L (ref 0–37)
BILIRUBIN TOTAL: 0.5 mg/dL (ref 0.2–1.2)
Bilirubin, Direct: 0.1 mg/dL (ref 0.0–0.3)
Indirect Bilirubin: 0.4 mg/dL (ref 0.2–1.2)
Total Protein: 6.9 g/dL (ref 6.0–8.3)

## 2014-04-08 LAB — BASIC METABOLIC PANEL WITH GFR
BUN: 33 mg/dL — ABNORMAL HIGH (ref 6–23)
CO2: 23 mEq/L (ref 19–32)
Calcium: 9.6 mg/dL (ref 8.4–10.5)
Chloride: 105 mEq/L (ref 96–112)
Creat: 1.04 mg/dL (ref 0.50–1.35)
GFR, Est Non African American: 81 mL/min
Glucose, Bld: 78 mg/dL (ref 70–99)
POTASSIUM: 4.3 meq/L (ref 3.5–5.3)
SODIUM: 137 meq/L (ref 135–145)

## 2014-04-08 LAB — LIPID PANEL
CHOL/HDL RATIO: 4.9 ratio
Cholesterol: 193 mg/dL (ref 0–200)
HDL: 39 mg/dL — ABNORMAL LOW (ref >39)
LDL Cholesterol: 97 mg/dL (ref 0–99)
Triglycerides: 285 mg/dL — ABNORMAL HIGH (ref <150)
VLDL: 57 mg/dL — ABNORMAL HIGH (ref 0–40)

## 2014-04-22 ENCOUNTER — Other Ambulatory Visit: Payer: Self-pay | Admitting: *Deleted

## 2014-04-22 DIAGNOSIS — L409 Psoriasis, unspecified: Secondary | ICD-10-CM

## 2014-04-23 ENCOUNTER — Other Ambulatory Visit: Payer: Self-pay | Admitting: Emergency Medicine

## 2014-04-24 ENCOUNTER — Other Ambulatory Visit: Payer: Self-pay

## 2014-04-24 ENCOUNTER — Telehealth: Payer: Self-pay | Admitting: *Deleted

## 2014-04-24 MED ORDER — LORAZEPAM 2 MG PO TABS: 2.0000 mg | ORAL_TABLET | Freq: Three times a day (TID) | ORAL | Status: DC | PRN

## 2014-04-24 NOTE — Telephone Encounter (Signed)
REFILL = PER PT  LORAZEPAM 2MG 

## 2014-05-25 ENCOUNTER — Other Ambulatory Visit: Payer: Self-pay | Admitting: Emergency Medicine

## 2014-05-25 MED ORDER — LORAZEPAM 2 MG PO TABS: 2.0000 mg | ORAL_TABLET | Freq: Three times a day (TID) | ORAL | Status: DC | PRN

## 2014-06-06 ENCOUNTER — Other Ambulatory Visit: Payer: Self-pay | Admitting: Emergency Medicine

## 2014-06-15 ENCOUNTER — Other Ambulatory Visit (HOSPITAL_COMMUNITY): Payer: Self-pay | Admitting: Emergency Medicine

## 2014-06-17 ENCOUNTER — Other Ambulatory Visit: Payer: Self-pay | Admitting: Emergency Medicine

## 2014-06-23 ENCOUNTER — Other Ambulatory Visit: Payer: Self-pay | Admitting: Emergency Medicine

## 2014-06-23 NOTE — Telephone Encounter (Signed)
Called into pharmacy

## 2014-07-06 ENCOUNTER — Other Ambulatory Visit: Payer: Self-pay | Admitting: Emergency Medicine

## 2014-07-07 ENCOUNTER — Encounter: Payer: Self-pay | Admitting: Emergency Medicine

## 2014-07-07 ENCOUNTER — Ambulatory Visit (INDEPENDENT_AMBULATORY_CARE_PROVIDER_SITE_OTHER): Payer: 59 | Admitting: Emergency Medicine

## 2014-07-07 ENCOUNTER — Ambulatory Visit
Admission: RE | Admit: 2014-07-07 | Discharge: 2014-07-07 | Disposition: A | Discharge status: Home / Self Care | Payer: PRIVATE HEALTH INSURANCE | Source: Ambulatory Visit | Attending: Emergency Medicine | Admitting: Emergency Medicine

## 2014-07-07 VITALS — BP 138/64 | HR 68 | Temp 98.0°F | Resp 18 | Ht 73.0 in | Wt 229.0 lb

## 2014-07-07 DIAGNOSIS — Z1212 Encounter for screening for malignant neoplasm of rectum: Secondary | ICD-10-CM

## 2014-07-07 DIAGNOSIS — R05 Cough: Secondary | ICD-10-CM

## 2014-07-07 DIAGNOSIS — I10 Essential (primary) hypertension: Secondary | ICD-10-CM

## 2014-07-07 DIAGNOSIS — Z0000 Encounter for general adult medical examination without abnormal findings: Secondary | ICD-10-CM

## 2014-07-07 DIAGNOSIS — Z79899 Other long term (current) drug therapy: Secondary | ICD-10-CM

## 2014-07-07 DIAGNOSIS — R059 Cough, unspecified: Secondary | ICD-10-CM

## 2014-07-07 DIAGNOSIS — E559 Vitamin D deficiency, unspecified: Secondary | ICD-10-CM

## 2014-07-07 DIAGNOSIS — Z125 Encounter for screening for malignant neoplasm of prostate: Secondary | ICD-10-CM

## 2014-07-07 LAB — CBC WITH DIFFERENTIAL/PLATELET
BASOS ABS: 0.1 10*3/uL (ref 0.0–0.1)
BASOS PCT: 1 % (ref 0–1)
EOS PCT: 8 % — AB (ref 0–5)
Eosinophils Absolute: 0.6 10*3/uL (ref 0.0–0.7)
HCT: 40.4 % (ref 39.0–52.0)
Hemoglobin: 14.2 g/dL (ref 13.0–17.0)
LYMPHS PCT: 24 % (ref 12–46)
Lymphs Abs: 1.8 10*3/uL (ref 0.7–4.0)
MCH: 32.3 pg (ref 26.0–34.0)
MCHC: 35.1 g/dL (ref 30.0–36.0)
MCV: 91.8 fL (ref 78.0–100.0)
Monocytes Absolute: 0.9 10*3/uL (ref 0.1–1.0)
Monocytes Relative: 12 % (ref 3–12)
NEUTROS ABS: 4 10*3/uL (ref 1.7–7.7)
Neutrophils Relative %: 55 % (ref 43–77)
PLATELETS: 288 10*3/uL (ref 150–400)
RBC: 4.4 MIL/uL (ref 4.22–5.81)
RDW: 13 % (ref 11.5–15.5)
WBC: 7.3 10*3/uL (ref 4.0–10.5)

## 2014-07-07 LAB — HEMOGLOBIN A1C
Hgb A1c MFr Bld: 5.3 % (ref <5.7)
MEAN PLASMA GLUCOSE: 105 mg/dL (ref <117)

## 2014-07-07 NOTE — Progress Notes (Signed)
Subjective:    Patient ID: Samuel Sharp, male    DOB: 04/19/1961, 53 y.o.   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 Deficient. He restarted humira x 1 month with Dr Emily FilbertGould. He his drinking at least 128 oz of water. He is drinking 1 6 pack of beer a week. He notes Dermatologist denies kidney adverse affect with Humira for psoriasis. He did stop Humira in past with improved numbers. We had also advised D?C of Humira due to unusual lung growth. He has not been great with compliance with f/u regarding abnormal growth. He did have CT scan 1/14 showing stabile growth. He keeps active with work only. He is trying to improve diet. He notes BP good when he checks it.   He has mild COPD and feels humid weather has increased difficulty with breathing mildly. He would like to cocnsider increased ADVAIR dosing to see if any improvement.   He notes anxiety/ mood is controlled with Lorazepam TID.  WBC             7.6   04/07/2014 HGB            13.1   04/07/2014 HCT            38.3   04/07/2014 PLT             277   04/07/2014 GLUCOSE          78   04/07/2014 CHOL            193   04/07/2014 TRIG            285   04/07/2014 HDL              39   04/07/2014 LDLCALC          97   04/07/2014 ALT              19   04/07/2014 AST              19   04/07/2014 NA              137   04/07/2014 K               4.3   04/07/2014 CL              105   04/07/2014 CREATININE     1.04   04/07/2014 BUN              33   04/07/2014 CO2              23   04/07/2014   Hypertension Associated symptoms include shortness of breath.  Hyperlipidemia Associated symptoms include shortness of breath.        Medication List       This list is accurate as of: 07/07/14  9:19 AM.  Always use your most recent med list.               ADVAIR DISKUS 250-50 MCG/DOSE Aepb  Generic drug:  Fluticasone-Salmeterol  USE 1 PUFF TWICE A DAY     atorvastatin 40 MG tablet  Commonly  known as:  LIPITOR  TAKE 1 TABLET (40 MG TOTAL) BY MOUTH DAILY.     AZOR 5-40 MG per tablet  Generic drug:  amLODipine-olmesartan  TAKE 1 TABLET BY MOUTH EVERY DAY     clobetasol ointment 0.05 %  Commonly  known as:  TEMOVATE  Apply as needed     EQL FLAX SEED OIL 1000 MG Caps  Take 1,000 mg by mouth daily.     Fish Oil 1000 MG Caps  Take 1,000 mg by mouth daily.     HUMIRA 40 MG/0.8ML injection  Generic drug:  adalimumab  ON HOLD     LORazepam 2 MG tablet  Commonly known as:  ATIVAN  TAKE ONE TABLET BY MOUTH THREE TIMES DAILY AS NEEDED     metoprolol tartrate 25 MG tablet  Commonly known as:  LOPRESSOR  Take 1 tablet (25 mg total) by mouth 2 (two) times daily.     metoprolol tartrate 25 MG tablet  Commonly known as:  LOPRESSOR  TAKE 1 TABLET (25 MG TOTAL) BY MOUTH 2 (TWO) TIMES DAILY.     multivitamin with minerals tablet  Take 1 tablet by mouth daily.     PROVENTIL HFA 108 (90 BASE) MCG/ACT inhaler  Generic drug:  albuterol  INHALE 2 PUFFS INTO THE LUNGS EVERY 4 (FOUR) HOURS AS NEEDED FOR WHEEZING OR SHORTNESS OF BREATH.       Allergies  Allergen Reactions  . Ace Inhibitors     cough  . Haldol [Haloperidol Lactate]     psychosis  . Haloperidol Lactate     REACTION: phsycotic break   Past Surgical History  Procedure Laterality Date  . Retinal detachment surgery    . Video bronchoscopy  07/26/2012    Procedure: VIDEO BRONCHOSCOPY WITH FLUORO;  Surgeon: Alyson Reedy, MD;  Location: [facility] ENDOSCOPY;  Service: Cardiopulmonary;  Laterality: Bilateral;    History  Substance Use Topics  . Smoking status: Former Smoker -- 1.00 packs/day for 35 years    Quit date: 03/25/2010  . Smokeless tobacco: Never Used  . Alcohol Use: Yes     Comment: every other day, 4-5drinks   Family History  Problem Relation Age of Onset  . Emphysema Father   . Asthma Mother   . Allergies Mother     MAINTENANCE: Colonoscopy:02/2011 due 2017 EYE:2015 wnl, glasses Dentist:  overdue  IMMUNIZATIONS: Td: 2008 Pneumovax: 2011/ 2013 Zostavax:n/a Influenza: 2014  Patient Care Team: Lucky Cowboy, MD as PCP - General (Internal Medicine) Kalman Shan, MD as Consulting Physician (Pulmonary Disease) Elmon Else, MD as Consulting Physician (Dermatology) Edmon Crape, MD as Consulting Physician (Ophthalmology) Wendall Stade, MD as Consulting Physician (Cardiology) Griffith Citron, MD as Consulting Physician (Gastroenterology) Waymon Budge, MD as Consulting Physician (Pulmonary Disease)   Review of Systems  Respiratory: Positive for shortness of breath.   All other systems reviewed and are negative.  BP 138/64  Pulse 68  Temp(Src) 98 F (36.7 C) (Temporal)  Resp 18  Ht 6\' 1"  (1.854 m)  Wt 229 lb (103.874 kg)  BMI 30.22 kg/m2      Objective:   Physical Exam  Nursing note and vitals reviewed. Constitutional: He is oriented to person, place, and time. He appears well-developed and well-nourished.  HENT:  Head: Normocephalic and atraumatic.  Right Ear: External ear normal.  Left Ear: External ear normal.  Nose: Nose normal.  Eyes: Conjunctivae and EOM are normal. Pupils are equal, round, and reactive to light. Right eye exhibits no discharge. Left eye exhibits no discharge. No scleral icterus.  Neck: Normal range of motion. Neck supple. No JVD present. No tracheal deviation present. No thyromegaly present.  Cardiovascular: Normal rate, regular rhythm, normal heart sounds and intact distal pulses.   Pulmonary/Chest: Effort normal  and breath sounds normal.  Abdominal: Soft. Bowel sounds are normal. He exhibits no distension and no mass. There is no tenderness. There is no rebound and no guarding.  Genitourinary: Rectum normal, prostate normal and penis normal. Guaiac negative stool. No penile tenderness.  Musculoskeletal: Normal range of motion. He exhibits no edema and no tenderness.  Lymphadenopathy:    He has no cervical adenopathy.   Neurological: He is alert and oriented to person, place, and time. He has normal reflexes. No cranial nerve deficit. He exhibits normal muscle tone. Coordination normal.  Skin: Skin is warm and dry. No rash noted. No erythema. No pallor.  Psychiatric: He has a normal mood and affect. His behavior is normal. Judgment and thought content normal.    AORTA SCAN WNL EKG NSCSPT     Assessment & Plan:  1. CPE- Update screening labs/ History/ Immunizations/ Testing as needed. Advised healthy diet, QD exercise, increase H20 and continue RX/ Vitamins AD.  2.? Kidney lab abnormality- May need refer to Nephrology if labs still elevated  3. ? Increase COPD- Trial of Breo 100 1 puff QD Sx #2 given, w/c if wants RX or try increase Advair dosing. Advised can f/u with Ramaswamy. Check CXR  3. CAD/ Aortic aneurysm- f/u Cardio scheduled 08/2014

## 2014-07-07 NOTE — Patient Instructions (Signed)
Chronic Obstructive Pulmonary Disease °Chronic obstructive pulmonary disease (COPD) is a common lung problem. In COPD, the flow of air from the lungs is limited. The way your lungs work will probably never return to normal, but there are things you can do to improve you lungs and make yourself feel better. °HOME CARE °· Take all medicines as told by your doctor. °· Only take over-the-counter or prescription medicines as told by your doctor. °· Avoid medicines or cough syrups that dry up your airway (such as antihistamines) and do not allow you to get rid of thick spit. You do not need to avoid them if told differently by your doctor. °· If you smoke, stop. Smoking makes the problem worse. °· Avoid being around things that make your breathing worse (like smoke, chemicals, and fumes). °· Use oxygen therapy and therapy to help improve your lungs (pulmonary rehabilitation) if told by your doctor. If you need home oxygen therapy, ask your doctor if you should buy a tool to measure your oxygen level (oximeter). °· Avoid people who have a sickness you can catch (contagious). °· Avoid going outside when it is very hot, cold, or humid. °· Eat healthy foods. Eat smaller meals more often. Rest before meals. °· Stay active, but remember to also rest. °· Make sure to get all the shots (vaccines) your doctor recommends. Ask your doctor if you need a pneumonia shot. °· Learn and use tips on how to relax. °· Learn and use tips on how to control your breathing as told by your doctor. Try: °¨ Breathing in (inhaling) through your nose for 1 second. Then, pucker your lips and breath out (exhale) through your lips for 2 seconds. °¨ Putting one hand on your belly (abdomen). Breathe in slowly through your nose for 1 second. Your hand on your belly should move out. Pucker your lips and breathe out slowly through your lips. Your hand on your belly should move in as you breathe out. °· Learn and use controlled coughing to clear thick spit  from your lungs. °1. Lean your head a little forward. °2. Breathe in deeply. °3. Try to hold your breath for 3 seconds. °4. Keep your mouth slightly open while coughing 2 times. °5. Spit any thick spit out into a tissue. °6. Rest and do the steps again 1 or 2 times as needed. °GET HELP IF: °· You cough up more thick spit than usual. °· There is a change in the color or thickness of the spit. °· It is harder to breathe than usual. °· Your breathing is faster than usual. °GET HELP RIGHT AWAY IF:  °· You have shortness of breath while resting. °· You have shortness of breath that stops you from: °¨ Being able to talk. °¨ Doing normal activities. °· You chest hurts for longer than 5 minutes. °· Your skin color is more blue than usual. °· Your pulse oximeter shows that you have low oxygen for longer than 5 minutes. °MAKE SURE YOU:  °· Understand these instructions. °· Will watch your condition. °· Will get help right away if you are not doing well or get worse. °Document Released: 05/29/2008 Document Revised: 10/01/2013 Document Reviewed: 08/07/2013 °ExitCare® Patient Information ©2015 [employer]. This information is not intended to replace advice given to you by your health care provider. Make sure you discuss any questions you have with your health care provider. ° °

## 2014-07-08 ENCOUNTER — Other Ambulatory Visit: Payer: Self-pay | Admitting: Emergency Medicine

## 2014-07-08 DIAGNOSIS — R6889 Other general symptoms and signs: Secondary | ICD-10-CM

## 2014-07-08 LAB — LIPID PANEL
CHOLESTEROL: 178 mg/dL (ref 0–200)
HDL: 51 mg/dL (ref >39)
LDL Cholesterol: 88 mg/dL (ref 0–99)
TRIGLYCERIDES: 195 mg/dL — AB (ref <150)
Total CHOL/HDL Ratio: 3.5 Ratio
VLDL: 39 mg/dL (ref 0–40)

## 2014-07-08 LAB — URINALYSIS, MICROSCOPIC ONLY
Bacteria, UA: NONE SEEN
CRYSTALS: NONE SEEN
Casts: NONE SEEN
Squamous Epithelial / LPF: NONE SEEN

## 2014-07-08 LAB — HEPATIC FUNCTION PANEL
ALBUMIN: 4.5 g/dL (ref 3.5–5.2)
ALT: 40 U/L (ref 0–53)
AST: 27 U/L (ref 0–37)
Alkaline Phosphatase: 74 U/L (ref 39–117)
BILIRUBIN DIRECT: 0.1 mg/dL (ref 0.0–0.3)
BILIRUBIN TOTAL: 0.9 mg/dL (ref 0.2–1.2)
Indirect Bilirubin: 0.8 mg/dL (ref 0.2–1.2)
Total Protein: 7.2 g/dL (ref 6.0–8.3)

## 2014-07-08 LAB — URINALYSIS, ROUTINE W REFLEX MICROSCOPIC
Bilirubin Urine: NEGATIVE
Glucose, UA: NEGATIVE mg/dL
HGB URINE DIPSTICK: NEGATIVE
KETONES UR: NEGATIVE mg/dL
Leukocytes, UA: NEGATIVE
NITRITE: NEGATIVE
Protein, ur: 30 mg/dL — AB
Specific Gravity, Urine: 1.022 (ref 1.005–1.030)
Urobilinogen, UA: 0.2 mg/dL (ref 0.0–1.0)
pH: 6.5 (ref 5.0–8.0)

## 2014-07-08 LAB — BASIC METABOLIC PANEL WITHOUT GFR
BUN: 26 mg/dL — ABNORMAL HIGH (ref 6–23)
CO2: 23 meq/L (ref 19–32)
Calcium: 9.8 mg/dL (ref 8.4–10.5)
Chloride: 104 meq/L (ref 96–112)
Creat: 1.08 mg/dL (ref 0.50–1.35)
GFR, Est African American: 89 mL/min
GFR, Est Non African American: 77 mL/min
Glucose, Bld: 88 mg/dL (ref 70–99)
Potassium: 4.6 meq/L (ref 3.5–5.3)
Sodium: 138 meq/L (ref 135–145)

## 2014-07-08 LAB — VITAMIN D 25 HYDROXY (VIT D DEFICIENCY, FRACTURES): Vit D, 25-Hydroxy: 51 ng/mL (ref 30–89)

## 2014-07-08 LAB — MICROALBUMIN / CREATININE URINE RATIO
Creatinine, Urine: 182.3 mg/dL
MICROALB/CREAT RATIO: 48.8 mg/g — AB (ref 0.0–30.0)
Microalb, Ur: 8.9 mg/dL — ABNORMAL HIGH (ref 0.00–1.89)

## 2014-07-08 LAB — TESTOSTERONE: Testosterone: 359 ng/dL (ref 300–890)

## 2014-07-08 LAB — INSULIN, FASTING: INSULIN FASTING, SERUM: 9 u[IU]/mL (ref 3–28)

## 2014-07-08 LAB — TSH: TSH: 7.135 u[IU]/mL — ABNORMAL HIGH (ref 0.350–4.500)

## 2014-07-08 LAB — MAGNESIUM: MAGNESIUM: 2.2 mg/dL (ref 1.5–2.5)

## 2014-07-08 LAB — PSA: PSA: 0.89 ng/mL (ref <=4.00)

## 2014-07-08 MED ORDER — LEVOTHYROXINE SODIUM 100 MCG PO TABS: 100.0000 ug | ORAL_TABLET | Freq: Every day | ORAL | Status: DC

## 2014-07-10 ENCOUNTER — Ambulatory Visit
Admission: RE | Admit: 2014-07-10 | Discharge: 2014-07-10 | Disposition: A | Discharge status: Home / Self Care | Payer: 59 | Source: Ambulatory Visit | Attending: Emergency Medicine | Admitting: Emergency Medicine

## 2014-07-10 DIAGNOSIS — R6889 Other general symptoms and signs: Secondary | ICD-10-CM

## 2014-07-19 NOTE — Addendum Note (Signed)
Addended by: Yaroslav Gombos A on: 07/19/2014 02:08 PM   Modules accepted: Orders

## 2014-07-20 ENCOUNTER — Other Ambulatory Visit: Payer: Self-pay | Admitting: Physician Assistant

## 2014-07-20 ENCOUNTER — Telehealth: Payer: Self-pay | Admitting: Internal Medicine

## 2014-07-20 MED ORDER — LORAZEPAM 2 MG PO TABS: ORAL_TABLET | ORAL | Status: DC

## 2014-07-20 NOTE — Telephone Encounter (Signed)
FYI.Marland Kitchen.Marland Kitchen.Patient called to inform us, that LORazepam (ATIVAN) 2 MG tablet [[id number]][[id number]] patient did not request, will wait until ready for refill and request from new pharmacy.  Not using [facility] [address]  Thank you, Katrina Webb Silvers[facility]Welch [facility] [phone] Fax [phone]

## 2014-07-21 ENCOUNTER — Other Ambulatory Visit: Payer: Self-pay | Admitting: Physician Assistant

## 2014-07-21 MED ORDER — LORAZEPAM 2 MG PO TABS: ORAL_TABLET | ORAL | Status: DC

## 2014-07-27 ENCOUNTER — Other Ambulatory Visit: Payer: Self-pay | Admitting: Emergency Medicine

## 2014-07-27 MED ORDER — FLUTICASONE FUROATE-VILANTEROL 100-25 MCG/INH IN AEPB: INHALATION_SPRAY | RESPIRATORY_TRACT | Status: DC

## 2014-08-10 ENCOUNTER — Other Ambulatory Visit: Payer: 59

## 2014-08-10 DIAGNOSIS — E039 Hypothyroidism, unspecified: Secondary | ICD-10-CM

## 2014-08-11 LAB — TSH: TSH: 2.147 u[IU]/mL (ref 0.350–4.500)

## 2014-08-18 ENCOUNTER — Other Ambulatory Visit: Payer: Self-pay | Admitting: Physician Assistant

## 2014-09-06 IMAGING — CT CT CHEST W/O CM
2 of 4 series · 15 of 36 positions shown, 18 images · IV contrast (Omnipaque 300)
Comparison: 07/19/2012 CT.  Radiograph 10/29/2012.

CLINICAL DATA: Right lower lobe nodule.  Pneumonia with likely
related effusion.  Follow-up.

CT CHEST WITHOUT CONTRAST
TECHNIQUE: Multidetector CT imaging of the chest was performed
following the standard protocol without IV contrast.

[Series 2: chest routine with · axial · 0.77mm/px · z∈[-320,-60]mm · 12 of 62 slices shown, 15 images]
[im 5/62  mediastinal]
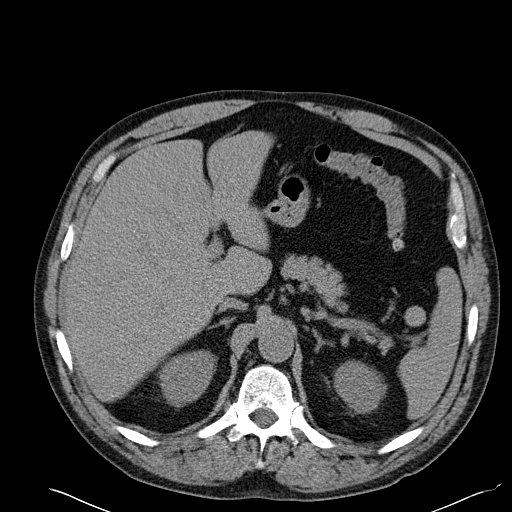
[im 5/62  lung]
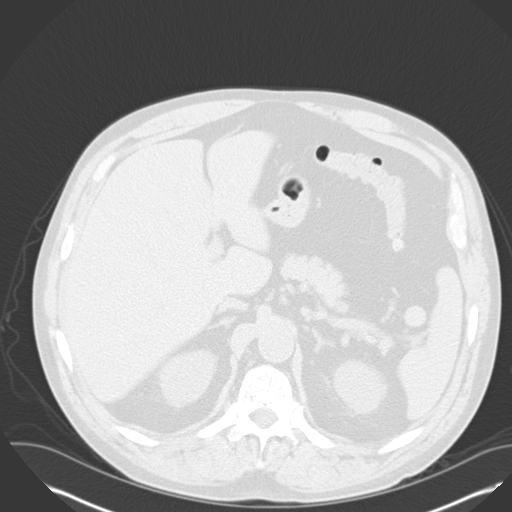
[im 10/62  lung]
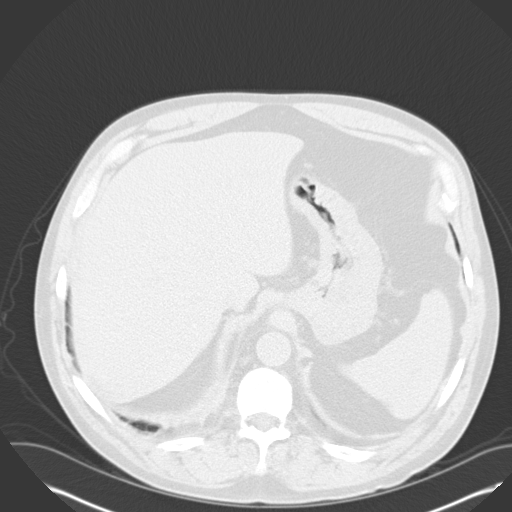
[im 15/62  lung]
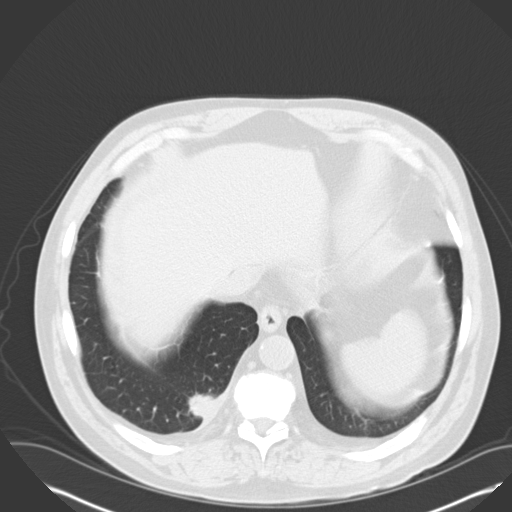
[im 19/62  lung]
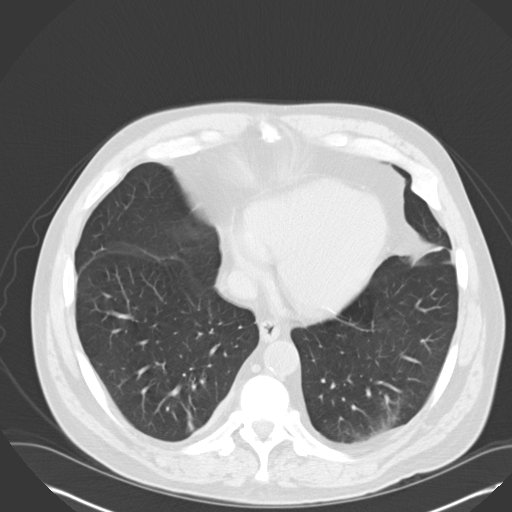
[im 24/62  mediastinal]
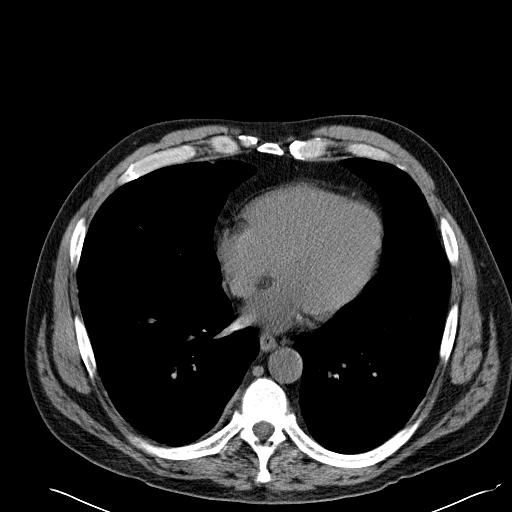
[im 24/62  lung]
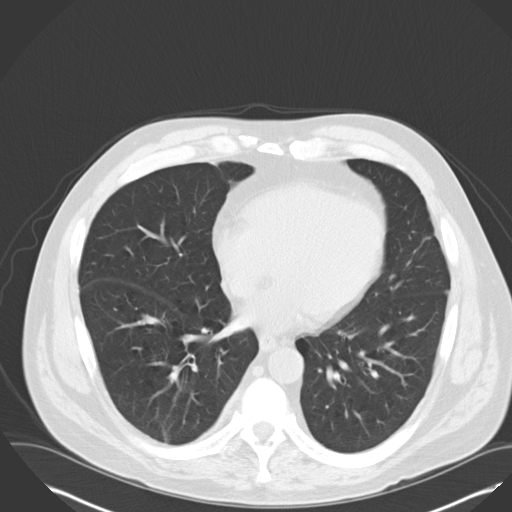
[im 29/62  lung]
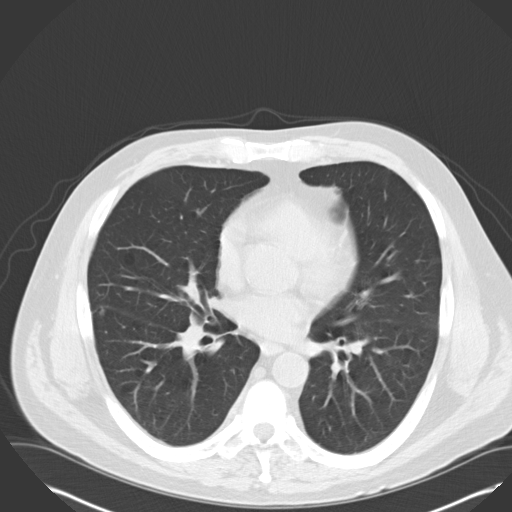
[im 33/62  lung]
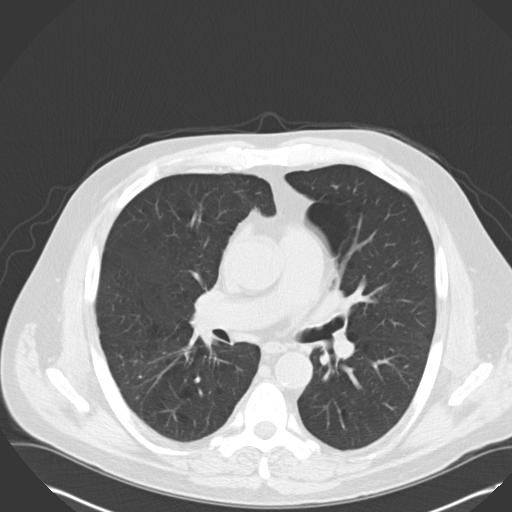
[im 38/62  lung]
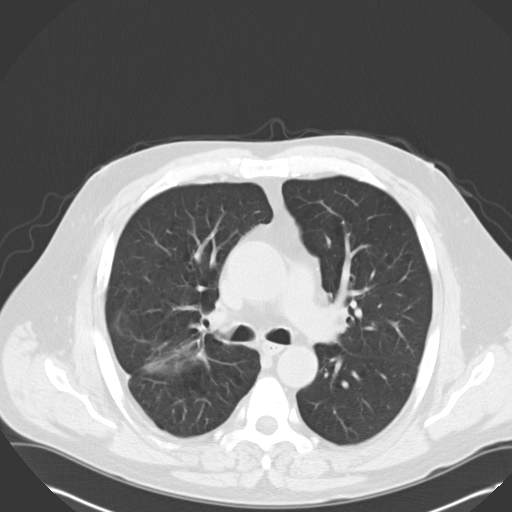
[im 43/62  mediastinal]
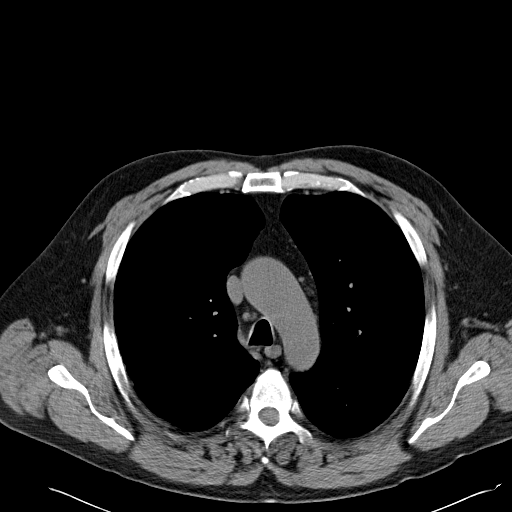
[im 43/62  lung]
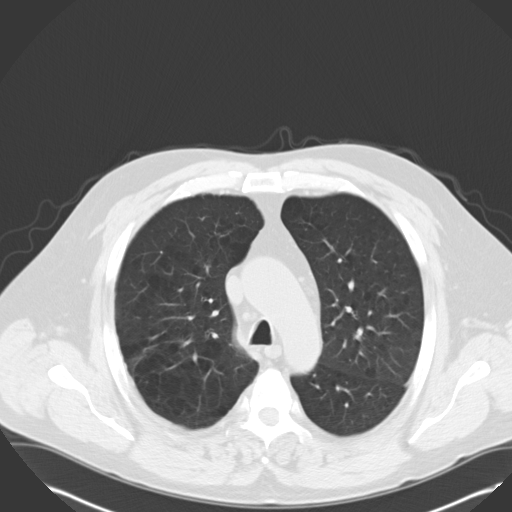
[im 47/62  lung]
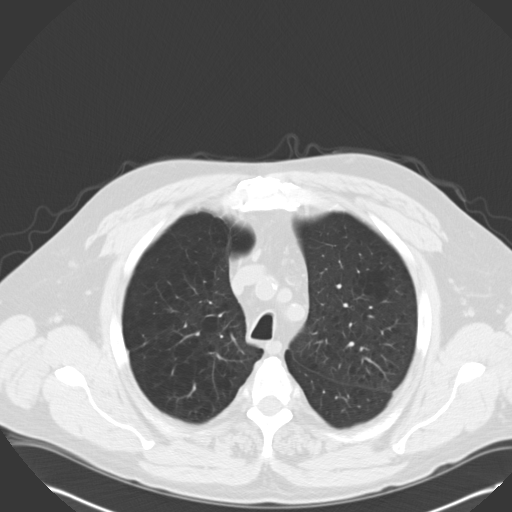
[im 52/62  lung]
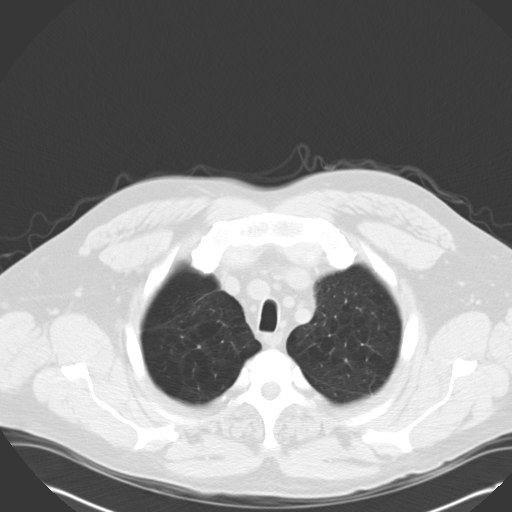
[im 57/62  lung]
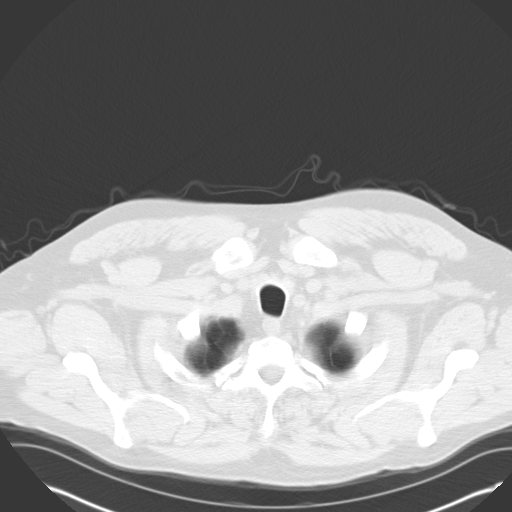

[Series 602: cor · coronal · 0.77mm/px · 3 of 123 slices shown]
[im 25/123  lung]
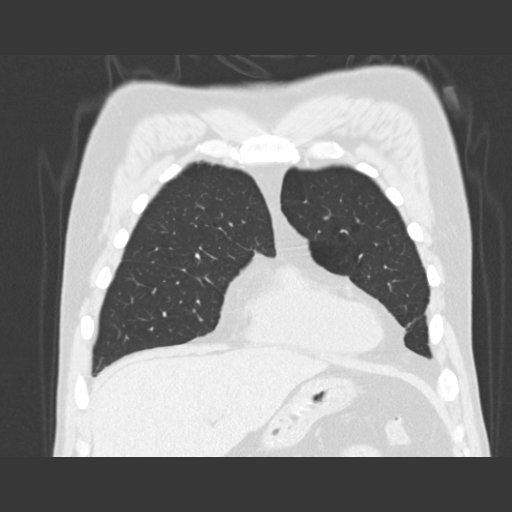
[im 49/123  lung]
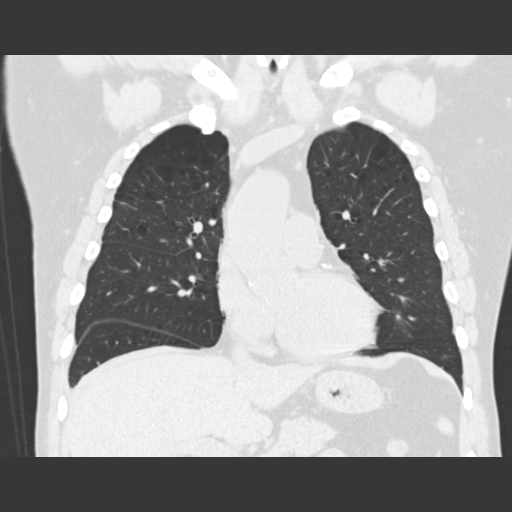
[im 74/123  lung]
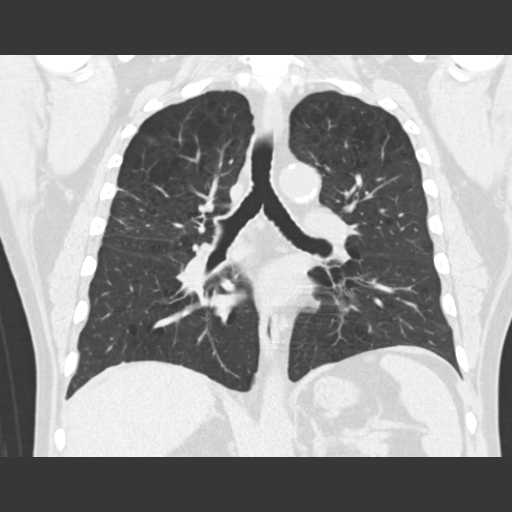

[15 of 36 positions shown; findings below may reference images not displayed]

FINDINGS: No pleural or pericardial effusion.  No mediastinal or
hilar adenopathy allowing for noncontrast CT.  No axillary
adenopathy.  Emphysema is present. Coronary artery atherosclerosis
is present. If office based assessment of coronary risk factors has
not been performed, it is now recommended.  Atherosclerotic
calcification affects all three vessels.  Incidental imaging of the
upper abdomen is within normal limits.  Liver appears normal.
There is a small aneurysm of the ascending aorta, with maximal
transverse diameter of 42 mm (image number 21 series 2).  Aortic
atherosclerosis.

The lung windows demonstrate scarring in the previous area of
consolidation in the right upper lobe extending up to the right
major fissure.  There is a pleural-based nodule in the right lower
lobe measuring 2.5 cm x 1.9 cm (image number 49 series 3).  In the
setting of previous loculated effusion, this is most compatible
with round atelectasis.  Neoplasm is considered unlikely given the
prior pleural disease.  There is no residual/recurrent airspace
disease.  6 mm ground-glass attenuation nodules present in the
superior segment of the right lower lobe (image number 35 series
3).

No aggressive osseous lesions.  Lingular scarring is present.
IMPRESSION: 1.  Right lower lobe nodule measuring 19 mm x 25 mm (image number
49 series 3) is most compatible with round atelectasis given the
prior pneumonia and loculated effusion.
2. 6 mm superior segment right lower lobe pulmonary nodule (image
number 35 series 3).  If the patient is at high risk for
bronchogenic carcinoma, follow-up chest CT at 6-12 months is
recommended.  If the patient is at low risk for bronchogenic
carcinoma, follow-up chest CT at 12 months is recommended.  This
recommendation follows the consensus statement: Guidelines for
Management of Small Pulmonary Nodules Detected on CT Scans: A
Statement from the [HOSPITAL] as published in Radiology
9000; [DATE].
3.  Scattered pulmonary parenchymal scarring associated with prior
pneumonia.Centrilobular emphysema.
4.  Atherosclerosis and coronary artery disease.
5.  Small ascending aortic aneurysm maximally measuring 42 mm.

## 2014-09-08 ENCOUNTER — Other Ambulatory Visit: Payer: Self-pay | Admitting: Physician Assistant

## 2014-09-15 ENCOUNTER — Encounter: Payer: Self-pay | Admitting: Cardiovascular Disease

## 2014-09-15 ENCOUNTER — Ambulatory Visit (INDEPENDENT_AMBULATORY_CARE_PROVIDER_SITE_OTHER): Payer: PRIVATE HEALTH INSURANCE | Admitting: Cardiovascular Disease

## 2014-09-15 VITALS — BP 132/88 | HR 91 | Ht 73.0 in | Wt 228.0 lb

## 2014-09-15 DIAGNOSIS — I10 Essential (primary) hypertension: Secondary | ICD-10-CM

## 2014-09-15 DIAGNOSIS — I2510 Atherosclerotic heart disease of native coronary artery without angina pectoris: Secondary | ICD-10-CM

## 2014-09-15 DIAGNOSIS — R079 Chest pain, unspecified: Secondary | ICD-10-CM

## 2014-09-15 DIAGNOSIS — E785 Hyperlipidemia, unspecified: Secondary | ICD-10-CM

## 2014-09-15 DIAGNOSIS — I7121 Aneurysm of the ascending aorta, without rupture: Secondary | ICD-10-CM | POA: Insufficient documentation

## 2014-09-15 DIAGNOSIS — I719 Aortic aneurysm of unspecified site, without rupture: Secondary | ICD-10-CM | POA: Insufficient documentation

## 2014-09-15 DIAGNOSIS — J984 Other disorders of lung: Secondary | ICD-10-CM

## 2014-09-15 NOTE — Assessment & Plan Note (Signed)
Will forward note to pulmonary to see if he needs any other f/u

## 2014-09-15 NOTE — Assessment & Plan Note (Signed)
Not really aneurysm  Dilated at 4.2 cm on previous CT consider f/u echo or MRI in a year BP control better

## 2014-09-15 NOTE — Assessment & Plan Note (Signed)
Well controlled.  Continue current medications and low sodium Dash type diet.    

## 2014-09-15 NOTE — Patient Instructions (Signed)
Your physician wants you to follow-up in:   YEAR WITH  DR NISHAN You will receive a reminder letter in the mail two months in advance. If you don't receive a letter, please call our office to schedule the follow-up appointment.  Your physician recommends that you continue on your current medications as directed. Please refer to the Current Medication list given to you today.   Your physician has requested that you have an exercise tolerance test. For further information please visit [email or web address]. Please also follow instruction sheet, as given.  

## 2014-09-15 NOTE — Assessment & Plan Note (Signed)
Calcium RCA by lung CT  Atypical pain Normal ECG Given occupation f/u ETT

## 2014-09-15 NOTE — Progress Notes (Signed)
Patient ID: Samuel Sharp, male   DOB: Oct 07, 1961, 53 y.o.   MRN: [id number] you smoker with COPD and dyspnea Sees our lung doctors CT scan done 11/13 to follow lung nodules. Showed some coronary artery calcification Reviewed CT and one speck of calcium in proximal RCA and some sporadic calcium in mid and distal LAD Overall score would likely be under 100. Labs from primary reviewed 07/04/13 LDL 111 TC 203 HDL 63 LFTls normal and A1c 5.0 Carotid duplex 09/20/12 no significant disease Quit smoking 3 years ago. ? 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 No chest pain Feels great. Works out on [employer]  IMPRESSION: 1. Right lower lobe nodule measuring 19 mm x 25 mm (image number 49 series 3) is most compatible with round atelectasis given the prior pneumonia and loculated effusion. 2. 6 mm superior segment right lower lobe pulmonary nodule (image number 35 series 3). If the patient is at high risk for bronchogenic carcinoma, follow-up chest CT at 6-12 months is recommended. If the patient is at low risk for bronchogenic carcinoma, follow-up chest CT at 12 months is recommended. This recommendation follows the consensus statement: Guidelines for Management of Small Pulmonary Nodules Detected on CT Scans: A Statement from the Fleischner Society as published in Radiology 2005; 237:395-400. 3. Scattered pulmonary parenchymal scarring associated with prior pneumonia.Centrilobular emphysema. 4. Atherosclerosis and coronary artery disease. 5. Small ascending aortic aneurysm maximally measuring 42 mm.   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  All other systems reviewed and negative  General: Affect appropriate Healthy:  appears stated age HEENT: normal Neck supple with no adenopathy JVP normal no bruits no thyromegaly Lungs clear with no wheezing and good diaphragmatic motion Heart:  S1/S2 no murmur, no rub, gallop or click PMI normal Abdomen: benighn, BS positve, no tenderness, no AAA no bruit.  No HSM or HJR Distal pulses intact with no bruits No edema Neuro non-focal Skin warm and dry No muscular weakness   Current Outpatient Prescriptions  Medication Sig Dispense Refill  . atorvastatin (LIPITOR) 40 MG tablet TAKE 1 TABLET (40 MG TOTAL) BY MOUTH DAILY.  30 tablet  3  . AZOR 5-40 MG per tablet TAKE 1 TABLET BY MOUTH EVERY DAY  90 tablet  0  . clobetasol ointment (TEMOVATE) 0.05 % Apply as needed      . Flaxseed, Linseed, (EQL FLAX SEED OIL) 1000 MG CAPS Take 1,000 mg by mouth daily.      . Fluticasone Furoate-Vilanterol (BREO ELLIPTA) 100-25 MCG/INH AEPB 1 puff inhaled daily, rinse mouth with water after inhalation  1 each  3  . HUMIRA 40 MG/0.8ML injection ON HOLD      . levothyroxine (SYNTHROID) 100 MCG tablet Take 1 tablet (100 mcg total) by mouth daily.  30 tablet  11  . LORazepam (ATIVAN) 2 MG tablet TAKE ONE TABLET BY MOUTH THREE TIMES DAILY AS NEEDED  90 tablet  1  . metoprolol tartrate (LOPRESSOR) 25 MG tablet Take 1 tablet (25 mg total) by mouth 2 (two) times daily.  60 tablet  0  . Multiple Vitamins-Minerals (MULTIVITAMIN WITH MINERALS) tablet Take 1 tablet by  mouth daily.      . Omega-3 Fatty Acids (FISH OIL) 1000 MG CAPS Take 1,000 mg by mouth daily.      Marland Kitchen PROVENTIL HFA 108 (90 BASE) MCG/ACT inhaler INHALE 2 PUFFS INTO THE LUNGS EVERY 4 (FOUR) HOURS AS NEEDED FOR WHEEZING OR SHORTNESS OF BREATH.  6.7 g  3   No current facility-administered medications for this visit.    Allergies  Ace inhibitors; Haldol; and Haloperidol lactate  Electrocardiogram:  7/15  SR normal ECG    Assessment and Plan

## 2014-09-15 NOTE — Assessment & Plan Note (Signed)
Cholesterol is at goal.  Continue current dose of statin and diet Rx.  No myalgias or side effects.  F/U  LFT's in 6 months. Lab Results  Component Value Date   LDLCALC 88 07/07/2014

## 2014-09-16 ENCOUNTER — Other Ambulatory Visit: Payer: Self-pay | Admitting: Physician Assistant

## 2014-10-07 ENCOUNTER — Other Ambulatory Visit: Payer: Self-pay | Admitting: Internal Medicine

## 2014-10-16 ENCOUNTER — Encounter: Payer: PRIVATE HEALTH INSURANCE | Admitting: Physician Assistant

## 2014-11-12 ENCOUNTER — Other Ambulatory Visit: Payer: Self-pay | Admitting: Internal Medicine

## 2014-11-12 MED ORDER — LORAZEPAM 2 MG PO TABS: ORAL_TABLET | ORAL | Status: DC

## 2014-11-13 ENCOUNTER — Other Ambulatory Visit: Payer: Self-pay | Admitting: Emergency Medicine

## 2014-11-16 ENCOUNTER — Other Ambulatory Visit: Payer: Self-pay | Admitting: Internal Medicine

## 2014-11-16 ENCOUNTER — Other Ambulatory Visit: Payer: Self-pay | Admitting: Emergency Medicine

## 2014-11-16 DIAGNOSIS — K219 Gastro-esophageal reflux disease without esophagitis: Secondary | ICD-10-CM

## 2014-11-16 DIAGNOSIS — J449 Chronic obstructive pulmonary disease, unspecified: Secondary | ICD-10-CM

## 2014-11-16 DIAGNOSIS — E782 Mixed hyperlipidemia: Secondary | ICD-10-CM

## 2014-11-16 DIAGNOSIS — F17200 Nicotine dependence, unspecified, uncomplicated: Secondary | ICD-10-CM

## 2014-11-16 DIAGNOSIS — J4489 Other specified chronic obstructive pulmonary disease: Secondary | ICD-10-CM

## 2014-11-16 DIAGNOSIS — I10 Essential (primary) hypertension: Secondary | ICD-10-CM

## 2014-11-18 ENCOUNTER — Other Ambulatory Visit: Payer: Self-pay | Admitting: Physician Assistant

## 2014-11-26 ENCOUNTER — Other Ambulatory Visit: Payer: Self-pay

## 2014-11-26 MED ORDER — FLUTICASONE FUROATE-VILANTEROL 100-25 MCG/INH IN AEPB: INHALATION_SPRAY | RESPIRATORY_TRACT | Status: DC

## 2014-12-06 ENCOUNTER — Other Ambulatory Visit: Payer: Self-pay | Admitting: Physician Assistant

## 2014-12-07 ENCOUNTER — Other Ambulatory Visit: Payer: Self-pay | Admitting: *Deleted

## 2014-12-07 MED ORDER — AMLODIPINE-OLMESARTAN 5-40 MG PO TABS: 1.0000 | ORAL_TABLET | Freq: Every day | ORAL | Status: DC

## 2014-12-08 ENCOUNTER — Other Ambulatory Visit: Payer: Self-pay

## 2014-12-10 ENCOUNTER — Other Ambulatory Visit: Payer: Self-pay | Admitting: Emergency Medicine

## 2014-12-15 ENCOUNTER — Other Ambulatory Visit: Payer: Self-pay | Admitting: Internal Medicine

## 2014-12-22 ENCOUNTER — Ambulatory Visit: Payer: Self-pay | Admitting: Physician Assistant

## 2015-01-06 ENCOUNTER — Other Ambulatory Visit: Payer: Self-pay | Admitting: Internal Medicine

## 2015-01-08 ENCOUNTER — Other Ambulatory Visit: Payer: Self-pay | Admitting: Internal Medicine

## 2015-01-13 ENCOUNTER — Ambulatory Visit (INDEPENDENT_AMBULATORY_CARE_PROVIDER_SITE_OTHER): Payer: 59 | Admitting: Physician Assistant

## 2015-01-13 ENCOUNTER — Encounter: Payer: Self-pay | Admitting: Physician Assistant

## 2015-01-13 VITALS — BP 142/84 | HR 74 | Temp 98.0°F | Resp 18 | Ht 73.0 in | Wt 235.0 lb

## 2015-01-13 DIAGNOSIS — E039 Hypothyroidism, unspecified: Secondary | ICD-10-CM

## 2015-01-13 DIAGNOSIS — I10 Essential (primary) hypertension: Secondary | ICD-10-CM

## 2015-01-13 DIAGNOSIS — Z720 Tobacco use: Secondary | ICD-10-CM

## 2015-01-13 DIAGNOSIS — E559 Vitamin D deficiency, unspecified: Secondary | ICD-10-CM

## 2015-01-13 DIAGNOSIS — F17200 Nicotine dependence, unspecified, uncomplicated: Secondary | ICD-10-CM

## 2015-01-13 DIAGNOSIS — E782 Mixed hyperlipidemia: Secondary | ICD-10-CM

## 2015-01-13 DIAGNOSIS — F411 Generalized anxiety disorder: Secondary | ICD-10-CM

## 2015-01-13 DIAGNOSIS — K219 Gastro-esophageal reflux disease without esophagitis: Secondary | ICD-10-CM

## 2015-01-13 DIAGNOSIS — Z79899 Other long term (current) drug therapy: Secondary | ICD-10-CM

## 2015-01-13 LAB — HEPATIC FUNCTION PANEL
ALBUMIN: 4.3 g/dL (ref 3.5–5.2)
ALT: 38 U/L (ref 0–53)
AST: 26 U/L (ref 0–37)
Alkaline Phosphatase: 89 U/L (ref 39–117)
BILIRUBIN DIRECT: 0.1 mg/dL (ref 0.0–0.3)
BILIRUBIN INDIRECT: 0.5 mg/dL (ref 0.2–1.2)
TOTAL PROTEIN: 7.6 g/dL (ref 6.0–8.3)
Total Bilirubin: 0.6 mg/dL (ref 0.2–1.2)

## 2015-01-13 LAB — CBC WITH DIFFERENTIAL/PLATELET
Basophils Absolute: 0.1 10*3/uL (ref 0.0–0.1)
Basophils Relative: 1 % (ref 0–1)
EOS PCT: 7 % — AB (ref 0–5)
Eosinophils Absolute: 0.6 10*3/uL (ref 0.0–0.7)
HCT: 41.5 % (ref 39.0–52.0)
HEMOGLOBIN: 14.1 g/dL (ref 13.0–17.0)
LYMPHS ABS: 1.6 10*3/uL (ref 0.7–4.0)
LYMPHS PCT: 20 % (ref 12–46)
MCH: 32.4 pg (ref 26.0–34.0)
MCHC: 34 g/dL (ref 30.0–36.0)
MCV: 95.4 fL (ref 78.0–100.0)
MPV: 9.3 fL (ref 8.6–12.4)
Monocytes Absolute: 0.8 10*3/uL (ref 0.1–1.0)
Monocytes Relative: 10 % (ref 3–12)
Neutro Abs: 5.1 10*3/uL (ref 1.7–7.7)
Neutrophils Relative %: 62 % (ref 43–77)
Platelets: 310 10*3/uL (ref 150–400)
RBC: 4.35 MIL/uL (ref 4.22–5.81)
RDW: 13.3 % (ref 11.5–15.5)
WBC: 8.2 10*3/uL (ref 4.0–10.5)

## 2015-01-13 LAB — LIPID PANEL
CHOLESTEROL: 176 mg/dL (ref 0–200)
HDL: 58 mg/dL (ref >39)
LDL Cholesterol: 64 mg/dL (ref 0–99)
Total CHOL/HDL Ratio: 3 Ratio
Triglycerides: 272 mg/dL — ABNORMAL HIGH (ref <150)
VLDL: 54 mg/dL — ABNORMAL HIGH (ref 0–40)

## 2015-01-13 LAB — BASIC METABOLIC PANEL WITH GFR
BUN: 24 mg/dL — ABNORMAL HIGH (ref 6–23)
CALCIUM: 9.9 mg/dL (ref 8.4–10.5)
CHLORIDE: 105 meq/L (ref 96–112)
CO2: 20 mEq/L (ref 19–32)
CREATININE: 0.93 mg/dL (ref 0.50–1.35)
GFR, Est African American: >89 mL/min
GFR, Est Non African American: >89 mL/min
Glucose, Bld: 91 mg/dL (ref 70–99)
Potassium: 4.7 mEq/L (ref 3.5–5.3)
Sodium: 137 mEq/L (ref 135–145)

## 2015-01-13 MED ORDER — LORAZEPAM 2 MG PO TABS
ORAL_TABLET | ORAL | Status: DC
Start: 2015-01-13 — End: 2015-02-17

## 2015-01-13 NOTE — Patient Instructions (Signed)
-  Continue medications as prescribed. - Please follow up in 3 months.  GIVE PT FOOD CHOICE LISTS FOR MEAL PLANNING  1)The amount of food you eat is important  -Too much can increase glucose levels and cause you to gain weight (which can  also increase glucose levels.  -Too little can decrease glucose level to unsafe levels (<70) 2)Eat meals and snacks at the same time each day to help your diabetes medication help you.  If you eat at different times each day, then the medication will not be as effective. 3)Do NOT skip meals or eat meals later than usual.  If you skip meals, then your glucose level can go low (<70).  Eating meals later than usual will not help the medication work effectively. 4) Amount of carbohydrates (carbs) per meal  -Breakfast- 30-45 grams of carbs  -Lunch and Dinner- 45-60 grams of carbs  -Snacks- 15-30 grams of carbs 5)Low fat foods have no more than 3 grams of fat per serving.  -Saturated- look for less than 1 gram per serving  -Trans Fat- look for 0 grams per serving 6)Exercise at least 120 minutes per week  Exercise Benefits:   -Lower LDL (Bad cholesterol)   -Lower blood pressure   -Increase HDL (Good cholesterol)   -Strengthen heart, lungs and muscles   -Burn calories and relieve stress   -Sleep better and help you feel better overall  To lower your risk of heart disease, limit your intake of saturated fat and trans fat as much as possible. THE GOOD WHAT IT DOES WHERE IT'S FOUND  MONOUNSATURATED FAT Lowers LDL and maybe raises HDL cholesterol Canola oil, olives, olive oil, peanuts, peanut oil, avocados, nuts  POLYUNSATURATED FAT Lowers LDL cholesterol Corn, safflower, sunflower and soybean oils, nuts, seeds  OMEGA-3 FATTY ACIDS Lowers triglycerides (blood fats) and blood pressure Salmon, mackerel, herring, sardines, flax seed, flaxseed oil, walnuts, soybean oil  THE BAD WHAT IT DOES WHERE IT'S FOUND  SATURATED FAT Raises LDL (bad) cholesterol Butter,  shortening, lard, red meat, cheese, whole milk, ice cream, coconut and palm oils  TRANS FAT Raises LDL cholesterol, lowers HDL (good) cholesterol Fried foods, some stick margarines, some cookies and crackers (look for hydrogenated fat on the ingredient list)  CHOLESTEROL FROM FOOD Too much may raise cholesterol levels Meat, poultry, seafood, eggs, milk, cheese, yogurt, butter   Plate Method (How much food of each food group) 1) Fill one half of your plate with nonstarchy vegetables: lettuce, broccoli, green beans, spinach, carrots or peppers. 2) Fill one quarter with protein: chicken, [place], fish, lean meat, eggs or tofu. 3) Fill one quarter with a nutritious carbohydrate food: brown rice, whole-wheat pasta, whole-wheat bread, peas, or corn.  Choose whole-wheat carbs for extra nutrition.  Controlling carbs helps you control your blood glucose. 4) Include a small piece of fruit at each meal, as well as 8 ounces of lowfat milk or yogurt. 5) Add 1-2 teaspoons of heart-healthy fat, such as olive or canola oil, trans fat-free margarine, avocado, nuts or seeds.

## 2015-01-13 NOTE — Progress Notes (Signed)
HPI A Caucasian 54 y.o. male presents for 6 month follow up with hypertension, hyperlipidemia, hypothyroidism and vitamin D.  Last visit was 07/07/14 for physical with labs.  Patient needs refill of Ativan for anxiety.  He states the Ativan is working for his anxiety without complications.  Patient is also taking Breo and Proventil for COPD.  He is taking Breo every day and it has been controlling his COPD.  He has not needed his Proventil at all lately.  CT Chest without contrast on 12/30/12 showed "pulmonary nodules" and is being monitored by Dr. Kalman Shan (Pulmonologist).  Patient has no other questions or concerns at this time.  His blood pressure has been controlled at home, today their BP is BP: (!) 142/84 mmHg.  He is currently taking Azor 5-40mg  and Metoprolol Tartrate  for BP.  He does workout, constantly moving at work and has home workout gym . He denies chest pain, shortness of breath, dizziness.   He is on cholesterol medication (Lipitor , Fish Oil and Flaxseed) and denies myalgias. His cholesterol is not at goal. The cholesterol last visit was:   Lab Results  Component Value Date   CHOL 178 07/07/2014   HDL 51 07/07/2014   LDLCALC 88 07/07/2014   TRIG 195* 07/07/2014   CHOLHDL 3.5 07/07/2014   Patient is not on Vitamin D supplement, but is taking multivitamin.  Lab Results  Component Value Date   VD25OH 51 07/07/2014     Hypothyroidism- Levothyroxine daily- Trys to remember to take it every day, but admits to forgetting due to having to take it 30 minutes before meal or any other medication.  ROS- Review of Systems  Constitutional: Negative.  Negative for fever, chills, malaise/fatigue and diaphoresis.  HENT: Negative.  Negative for congestion, ear discharge, ear pain and sore throat.   Eyes: Negative.   Respiratory: Positive for shortness of breath. Negative for cough and wheezing.        SOB from COPD is controlled with Breo inhaler.  Has not needed  rescue inhaler.  Cardiovascular: Negative.  Negative for chest pain and leg swelling.  Gastrointestinal: Negative.  Negative for nausea, vomiting, abdominal pain, diarrhea and constipation.  Genitourinary: Negative.  Negative for dysuria, urgency and frequency.  Musculoskeletal: Negative.   Skin: Negative.  Negative for rash.  Neurological: Negative.  Negative for headaches.  Psychiatric/Behavioral: Negative for depression. The patient is nervous/anxious. The patient does not have insomnia.    Medical History:  Past Medical History  Diagnosis Date  . Asthma     Present all life. Dx as child. Been in [facility] since 2003--pft 06/03/10 FEV1 3.14/ 82%, FEV1/FVC 0.54; insig resp to BD, DLCO 75%, ni lUNG  Vol  . Hyperlipidemia   . Hypertension     on lisinopril 2002/2003--changed ARB march 2011  . Palpitations     started atenolol since 2003. recollects hx of holter. 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 informed it "Not a dangerous condition"-changed to lopressor 12/15/2010  . Pulmonary embolism     does not recollect tpA or being on lot of o2 or hypotension. 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 Psoriasis     on enbre;l since 005. started in [place]. Stopped 2011 by [place] derm due to nodule on chest  . Detached retina   . Lung nodule     5mm on ct 11/10/10- likely lymph node  . History of TB skin testing  negative 2005-2010   Current Medications:  Current Outpatient Prescriptions on File Prior to Visit  Medication Sig Dispense Refill  . amLODipine-olmesartan (AZOR) 5-40 MG per tablet Take 1 tablet by mouth daily. 90 tablet 0  . atorvastatin (LIPITOR) 40 MG tablet TAKE 1 TABLET (40 MG TOTAL) BY MOUTH DAILY. 30 tablet 3  . clobetasol ointment (TEMOVATE) 0.05 % Apply as needed    . Flaxseed, Linseed, (EQL FLAX SEED OIL) 1000 MG CAPS Take 1,000 mg by mouth daily.    . Fluticasone Furoate-Vilanterol (BREO ELLIPTA) 100-25 MCG/INH AEPB 1 puff inhaled  daily, rinse mouth with water after inhalation 60 each 3  . HUMIRA 40 MG/0.8ML injection ON HOLD    . levothyroxine (SYNTHROID) 100 MCG tablet Take 1 tablet (100 mcg total) by mouth daily. 30 tablet 11  . LORazepam (ATIVAN) 2 MG tablet TAKE 1/2 TO 1 TABLET BY MOUTH 3 TIMES A DAY AS NEEDED 30 tablet 0  . meloxicam (MOBIC) 15 MG tablet TAKE 1 TABLET BY MOUTH EVERY DAY 30 tablet 0  . metoprolol tartrate (LOPRESSOR) 25 MG tablet TAKE 1 TABLET (25 MG TOTAL) BY MOUTH 2 (TWO) TIMES DAILY. 180 tablet 1  . Multiple Vitamins-Minerals (MULTIVITAMIN WITH MINERALS) tablet Take 1 tablet by mouth daily.    . Omega-3 Fatty Acids (FISH OIL) 1000 MG CAPS Take 1,000 mg by mouth daily.    Marland Kitchen. PROVENTIL HFA 108 (90 BASE) MCG/ACT inhaler INHALE 2 PUFFS INTO THE LUNGS EVERY 4 (FOUR) HOURS AS NEEDED FOR WHEEZING OR SHORTNESS OF BREATH. 6.7 g 3   No current facility-administered medications on file prior to visit.   Allergies:  Allergies  Allergen Reactions  . Ace Inhibitors     cough  . Haldol [Haloperidol Lactate]     psychosis  . Haloperidol Lactate     REACTION: phsycotic break    Family history- Review and unchanged Social history- Review and unchanged  Physical Exam: BP 142/84 mmHg  Pulse 74  Temp(Src) 98 F (36.7 C) (Temporal)  Resp 18  Ht 6\' 1"  (1.854 m)  Wt 235 lb (106.595 kg)  BMI 31.01 kg/m2  SpO2 97% Wt Readings from Last 3 Encounters:  01/13/15 235 lb (106.595 kg)  09/15/14 228 lb (103.42 kg)  07/07/14 229 lb (103.874 kg)  Vitals Reviewed General Appearance: Well nourished, in no apparent distress. Obese. Eyes: PERRLA, EOMIs, conjunctiva no swelling or erythema.  No scleral icterus. Sinuses: No Frontal/maxillary tenderness ENT/Mouth: External auditory canals clear, TMs without erythema, edema or bulging. No erythema, edema, or exudate on posterior pharynx.  Tonsils not swollen or erythematous. Hearing normal.  Neck: Supple, thyroid normal.  Respiratory: Respiratory effort normal, Mild  expiratory wheezing in all lung fields.  No rales, rhonchi or stridor.  Cardio: RRR.  m/r/g. S1S2nl. Brisk peripheral pulses without edema.  Abdomen: Soft, + normal BS.  Non tender, no guarding, rebound, hernias, masses. Lymphatics: Non tender without lymphadenopathy.  Musculoskeletal: Full ROM, 5/5 strength, normal gait.  Skin: Warm, dry intact without rashes, lesions, ecchymosis.  Neuro: Cranial nerves intact. No cerebellar symptoms. Sensation intact.  Psych: Awake and oriented X 3, normal affect, Insight and Judgment appropriate.   Assessment and Plan:  1. Essential hypertension Continue Azor and Metoprolol as prescribed.  Monitor blood pressure at home.  Reminder to go to the ER if any CP, SOB, nausea, dizziness, severe HA, changes vision/speech, left arm numbness and tingling, and jaw pain. - CBC with Differential - BASIC METABOLIC PANEL WITH GFR - Hepatic function panel  2. Mixed hyperlipidemia Continue Lipitor, Fish Oil and Flaxseed as prescribed.  Please follow recommended diet and exercise.  Check cholesterol level. - Lipid panel  3. Hypothyroidism, unspecified hypothyroidism type Check TSH level, continue medications the same, reminded to take on an empty stomach 30-50mins before food.  - TSH  4. Vitamin D deficiency Continue multivitamin. Might have pt start vitamin D.  Check vitamin D level. - Vit D  25 hydroxy   5. Anxiety state Continue Ativan as prescribed- LORazepam (ATIVAN) 2 MG tablet; TAKE 1 TABLET BY MOUTH 3 TIMES A DAY AS NEEDED  Dispense: 90 tablet; Refill: 0  6. COPD Continue Breo Ellipta and Proventil HFA as prescribed.  Please continue to see pulmonologist due to nodules in lungs.  7. TOBACCO ABUSE Stopped smoking in April 2011.    8. Encounter for long-term (current) use of medications Will monitor kidney and liver function. - CBC with Differential - BASIC METABOLIC PANEL WITH GFR - Hepatic function panel  Continue diet and meds as discussed.  Further disposition pending results of labs. Discussed medication effects and SE's.  Pt agreed to treatment plan. Please follow up in 3 months.   Corin Formisano, Lise Auer, PA-C 1:49 PM [facility]

## 2015-01-14 LAB — VITAMIN D 25 HYDROXY (VIT D DEFICIENCY, FRACTURES): Vit D, 25-Hydroxy: 24 ng/mL — ABNORMAL LOW (ref 30–100)

## 2015-01-14 LAB — TSH: TSH: 4.958 u[IU]/mL — ABNORMAL HIGH (ref 0.350–4.500)

## 2015-02-17 ENCOUNTER — Other Ambulatory Visit: Payer: Self-pay | Admitting: Physician Assistant

## 2015-03-03 ENCOUNTER — Other Ambulatory Visit: Payer: Self-pay | Admitting: Internal Medicine

## 2015-03-04 ENCOUNTER — Other Ambulatory Visit: Payer: Self-pay | Admitting: Internal Medicine

## 2015-03-15 ENCOUNTER — Other Ambulatory Visit: Payer: Self-pay | Admitting: Internal Medicine

## 2015-03-16 ENCOUNTER — Other Ambulatory Visit: Payer: Self-pay | Admitting: Internal Medicine

## 2015-03-16 ENCOUNTER — Other Ambulatory Visit: Payer: Self-pay | Admitting: Physician Assistant

## 2015-03-16 DIAGNOSIS — F411 Generalized anxiety disorder: Secondary | ICD-10-CM

## 2015-03-31 ENCOUNTER — Other Ambulatory Visit: Payer: Self-pay | Admitting: Physician Assistant

## 2015-04-14 ENCOUNTER — Ambulatory Visit: Payer: Self-pay | Admitting: Internal Medicine

## 2015-05-04 ENCOUNTER — Other Ambulatory Visit: Payer: Self-pay | Admitting: Physician Assistant

## 2015-05-11 ENCOUNTER — Other Ambulatory Visit: Payer: Self-pay

## 2015-05-11 MED ORDER — ATORVASTATIN CALCIUM 40 MG PO TABS: ORAL_TABLET | ORAL | Status: DC

## 2015-05-30 ENCOUNTER — Other Ambulatory Visit: Payer: Self-pay | Admitting: Internal Medicine

## 2015-05-31 NOTE — Telephone Encounter (Signed)
No refill without OV 

## 2015-06-09 ENCOUNTER — Ambulatory Visit (INDEPENDENT_AMBULATORY_CARE_PROVIDER_SITE_OTHER): Payer: 59 | Admitting: Internal Medicine

## 2015-06-09 ENCOUNTER — Encounter: Payer: Self-pay | Admitting: Internal Medicine

## 2015-06-09 VITALS — BP 132/84 | HR 80 | Temp 97.9°F | Resp 16 | Ht 73.25 in | Wt 231.0 lb

## 2015-06-09 DIAGNOSIS — R5383 Other fatigue: Secondary | ICD-10-CM

## 2015-06-09 DIAGNOSIS — E782 Mixed hyperlipidemia: Secondary | ICD-10-CM

## 2015-06-09 DIAGNOSIS — Z1212 Encounter for screening for malignant neoplasm of rectum: Secondary | ICD-10-CM

## 2015-06-09 DIAGNOSIS — E559 Vitamin D deficiency, unspecified: Secondary | ICD-10-CM

## 2015-06-09 DIAGNOSIS — R7309 Other abnormal glucose: Secondary | ICD-10-CM

## 2015-06-09 DIAGNOSIS — Z125 Encounter for screening for malignant neoplasm of prostate: Secondary | ICD-10-CM

## 2015-06-09 DIAGNOSIS — Z6830 Body mass index (BMI) 30.0-30.9, adult: Secondary | ICD-10-CM

## 2015-06-09 DIAGNOSIS — Z79899 Other long term (current) drug therapy: Secondary | ICD-10-CM

## 2015-06-09 DIAGNOSIS — I10 Essential (primary) hypertension: Secondary | ICD-10-CM

## 2015-06-09 LAB — CBC WITH DIFFERENTIAL/PLATELET
Basophils Absolute: 0.1 10*3/uL (ref 0.0–0.1)
Basophils Relative: 1 % (ref 0–1)
EOS PCT: 7 % — AB (ref 0–5)
Eosinophils Absolute: 0.5 10*3/uL (ref 0.0–0.7)
HEMATOCRIT: 39.3 % (ref 39.0–52.0)
Hemoglobin: 13.6 g/dL (ref 13.0–17.0)
Lymphocytes Relative: 29 % (ref 12–46)
Lymphs Abs: 2.1 10*3/uL (ref 0.7–4.0)
MCH: 32.9 pg (ref 26.0–34.0)
MCHC: 34.6 g/dL (ref 30.0–36.0)
MCV: 95.2 fL (ref 78.0–100.0)
MONOS PCT: 11 % (ref 3–12)
MPV: 8.8 fL (ref 8.6–12.4)
Monocytes Absolute: 0.8 10*3/uL (ref 0.1–1.0)
Neutro Abs: 3.8 10*3/uL (ref 1.7–7.7)
Neutrophils Relative %: 52 % (ref 43–77)
Platelets: 293 10*3/uL (ref 150–400)
RBC: 4.13 MIL/uL — ABNORMAL LOW (ref 4.22–5.81)
RDW: 13 % (ref 11.5–15.5)
WBC: 7.4 10*3/uL (ref 4.0–10.5)

## 2015-06-09 NOTE — Progress Notes (Signed)
Patient ID: Samuel Sharp, male   DOB: 07/18/1961, 54 y.o.   MRN: [id number]   Annual Comprehensive Examination  This very nice 54 y.o.male presents for complete physical.  Patient has been followed for HTN, Prediabetes, Hyperlipidemia, Hypothyoidism and Vitamin D Deficiency. Patient is followed by Dr Elmon Else for tx with [facility] for psoriasis.    HTN predates circa 2000.  Patient's BP has been controlled at home.Today's BP: 132/84 mmHg. Patient denies any cardiac symptoms as chest pain, palpitations, shortness of breath, dizziness or ankle swelling. Patient also has COPD and is on treatment for asthma.     Patient's hyperlipidemia is controlled with diet and medications. Patient denies myalgias or other medication SE's. Last lipids were at goal -  Cholesterol 176; HDL 58; LDL 64; Triglycerides 272 on 01/13/2015.    Patient has prediabetes and patient denies reactive hypoglycemic symptoms, visual blurring, diabetic polys or paresthesias. Last A1c was  5.3% on 07/07/2014.    Patient has compensated hypothyroidism.   Finally, patient has history of Vitamin D Deficiency and last vitamin D was still very low at  24 on 01/13/2015.     Medication Sig  . amlodipine-olmesartan  10-20 MG per  Take 1 tablet by mouth.  Marland Kitchen atorvastatin  40 MG tablet TAKE 1 TAB DAILY.  Marland Kitchen AZOR 5-40 MG per tablet TAKE 1 TAB DAILY.  Marland Kitchen BREO ELLIPTA 100-25 MCG/INH AEPB USE 1 PUFF INHALED DAILY  . clobetasol ointment (TEMOVATE) Apply as needed  .  FLAX SEED OIL 1000 MG CAPS Take 1,000 mg by mouth daily.  Marland Kitchen levothyroxine  100 MCG tablet Take 1 tablet (100 mcg total) by mouth daily.  Marland Kitchen LORazepam  2 MG tablet TAKE 1 TABLET BY MOUTH 3 TIMES A DAY AS NEEDED  . meloxicam (MOBIC) 15 MG tablet TAKE 1 TABLET BY MOUTH EVERY DAY  . metoprolol tartrate 25 MG  TAKE 1 TAB  2  TIMES DAILY.  . Multiple Vitamins-Minerals  Take 1 tablet by mouth daily.  Marland Kitchen FISH OIL 1000 MG CAPS Take 1,000 mg by mouth daily.  Marland Kitchen PROAIR HFA 108 (90 BASE) MCG/ACT  inhaler INHALE 2 PUFFS  EVERY 4 HOURS AS NEEDED  . HUMIRA 40 MG/0.8ML injection ON HOLD   Allergies  Allergen Reactions  . Ace Inhibitors     cough  . Haldol [Haloperidol Lactate]     psychosis  . Haloperidol Lactate     REACTION: phsycotic break   Past Medical History  Diagnosis Date  . Asthma     Present all life. Dx as child. Been in [facility] since 2003--pft 06/03/10 FEV1 3.14/ 82%, FEV1/FVC 0.54; insig resp to BD, DLCO 75%, ni lUNG  Vol  . Hyperlipidemia   . Hypertension     on lisinopril 2002/2003--changed ARB march 2011  . Palpitations     started atenolol since 2003. recollects hx of holter. 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 informed it "Not a dangerous condition"-changed to lopressor 12/15/2010  . Pulmonary embolism     does not recollect tpA or being on lot of o2 or hypotension. 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 Psoriasis     on enbre;l since 005. started in [place]. Stopped 2011 by [place] derm due to nodule on chest  . Detached retina   . Lung nodule     31mm on ct 11/10/10- likely lymph node  . History of TB skin testing     negative 2005-2010  Health Maintenance  Topic Date Due  . TETANUS/TDAP  04/26/1978  . COLONOSCOPY  04/26/2009  . INFLUENZA VACCINE  07/26/2015  . HIV Screening  Completed   Immunization History  Administered Date(s) Administered  . Influenza Split 11/15/2012  . Influenza Whole 09/24/2010  . Pneumococcal Polysaccharide-23 06/03/2010   Past Surgical History  Procedure Laterality Date  . Retinal detachment surgery    . Video bronchoscopy  07/26/2012    Procedure: VIDEO BRONCHOSCOPY WITH FLUORO;  Surgeon: Alyson Reedy, MD;  Location: [facility] ENDOSCOPY;  Service: Cardiopulmonary;  Laterality: Bilateral;   Family History  Problem Relation Age of Onset  . Emphysema Father   . Asthma Mother   . Allergies Mother    History   Social History  . Marital Status: Single    Spouse Name: N/A  . Number of  Children: N/A  . Years of Education: N/A   Occupational History  . Not on file.   Social History Main Topics  . Smoking status: Former Smoker -- 1.00 packs/day for 35 years    Quit date: 03/25/2010  . Smokeless tobacco: Never Used  . Alcohol Use: 7.2 oz/week    12 Standard drinks or equivalent per week     Comment: every other day, 4-5drinks  . Drug Use: No  . Sexual Activity: No    ROS Constitutional: Denies fever, chills, weight loss/gain, headaches, insomnia,  night sweats or change in appetite. Does c/o fatigue. Eyes: Denies redness, blurred vision, diplopia, discharge, itchy or watery eyes.  ENT: Denies discharge, congestion, post nasal drip, epistaxis, sore throat, earache, hearing loss, dental pain, Tinnitus, Vertigo, Sinus pain or snoring.  Cardio: Denies chest pain, palpitations, irregular heartbeat, syncope, dyspnea, diaphoresis, orthopnea, PND, claudication or edema Respiratory: denies cough, dyspnea, DOE, pleurisy, hoarseness, laryngitis or wheezing.  Gastrointestinal: Denies dysphagia, heartburn, reflux, water brash, pain, cramps, nausea, vomiting, bloating, diarrhea, constipation, hematemesis, melena, hematochezia, jaundice or hemorrhoids Genitourinary: Denies dysuria, frequency, urgency, nocturia, hesitancy, discharge, hematuria or flank pain Musculoskeletal: Denies arthralgia, myalgia, stiffness, Jt. Swelling, pain, limp or strain/sprain. Denies Falls. Skin: Denies puritis, rash, hives, warts, acne, eczema or change in skin lesion Neuro: No weakness, tremor, incoordination, spasms, paresthesia or pain Psychiatric: Denies confusion, memory loss or sensory loss. Denies Depression. Endocrine: Denies change in weight, skin, hair change, nocturia, and paresthesia, diabetic polys, visual blurring or hyper / hypo glycemic episodes.  Heme/Lymph: No excessive bleeding, bruising or enlarged lymph nodes.  Physical Exam  BP 132/84   Pulse 80  Temp 97.9 F   Resp 16  Ht 6'  1.25" Wt 231 lb     BMI 30.25  General Appearance: Well nourished, in no apparent distress. Eyes: PERRLA, EOMs, conjunctiva no swelling or erythema, normal fundi and vessels. Sinuses: No frontal/maxillary tenderness ENT/Mouth: EACs patent / TMs  nl. Nares clear without erythema, swelling, mucoid exudates. Oral hygiene is good. No erythema, swelling, or exudate. Tongue normal, non-obstructing. Tonsils not swollen or erythematous. Hearing normal.  Neck: Supple, thyroid normal. No bruits, nodes or JVD. Respiratory: Respiratory effort normal.  BS equal and clear bilateral without rales, rhonci, wheezing or stridor. Cardio: Heart sounds are normal with regular rate and rhythm and no murmurs, rubs or gallops. Peripheral pulses are normal and equal bilaterally without edema. No aortic or femoral bruits. Chest: symmetric with normal excursions and percussion.  Abdomen: Flat, soft, with bowel sounds. Nontender, no guarding, rebound, hernias, masses, or organomegaly.  Lymphatics: Non tender without lymphadenopathy.  Genitourinary: No hernias.Testes nl. DRE - prostate nl  for age - smooth & firm w/o nodules. Musculoskeletal: Full ROM all peripheral extremities, joint stability, 5/5 strength, and normal gait. Skin: Warm and dry without rashes, lesions, cyanosis, clubbing or  ecchymosis.  Neuro: Cranial nerves intact, reflexes equal bilaterally. Normal muscle tone, no cerebellar symptoms. Sensation intact.  Pysch: Awake and oriented X 3 with normal affect, insight and judgment appropriate.   Assessment and Plan  1. Essential hypertension  - Microalbumin / creatinine urine ratio - EKG 12-Lead - [place], RETROPERITNL ABD,  LTD - TSH  2. Mixed hyperlipidemia  - Lipid panel  3. Abnormal glucose  - Hemoglobin A1c - Insulin, random - Vit D  25 hydroxy   4. Vitamin D deficiency   5. Prostate cancer screening  - PSA  6. Other fatigue  - Vitamin B12 - Testosterone - Iron and TIBC  7.  Medication management  - Urine Microscopic - CBC with Differential/Platelet - BASIC METABOLIC PANEL WITH GFR - Hepatic function panel - Magnesium  8. Screening for rectal cancer  - POC Hemoccult Bld/Stl    Continue prudent diet as discussed, weight control, BP monitoring, regular exercise, and medications as discussed.  Discussed med effects and SE's. Routine screening labs and tests as requested with regular follow-up as recommended.  Over 40 minutes of exam, counseling &  chart review was performed

## 2015-06-09 NOTE — Patient Instructions (Signed)

## 2015-06-10 LAB — HEPATIC FUNCTION PANEL
ALBUMIN: 4.3 g/dL (ref 3.5–5.2)
ALT: 19 U/L (ref 0–53)
AST: 19 U/L (ref 0–37)
Alkaline Phosphatase: 63 U/L (ref 39–117)
BILIRUBIN DIRECT: 0.2 mg/dL (ref 0.0–0.3)
Indirect Bilirubin: 0.6 mg/dL (ref 0.2–1.2)
TOTAL PROTEIN: 6.9 g/dL (ref 6.0–8.3)
Total Bilirubin: 0.8 mg/dL (ref 0.2–1.2)

## 2015-06-10 LAB — BASIC METABOLIC PANEL WITH GFR
BUN: 30 mg/dL — ABNORMAL HIGH (ref 6–23)
CO2: 21 meq/L (ref 19–32)
Calcium: 9.5 mg/dL (ref 8.4–10.5)
Chloride: 104 mEq/L (ref 96–112)
Creat: 1.19 mg/dL (ref 0.50–1.35)
GFR, Est African American: 78 mL/min
GFR, Est Non African American: 68 mL/min
GLUCOSE: 77 mg/dL (ref 70–99)
Potassium: 4.6 mEq/L (ref 3.5–5.3)
Sodium: 136 mEq/L (ref 135–145)

## 2015-06-10 LAB — VITAMIN D 25 HYDROXY (VIT D DEFICIENCY, FRACTURES): Vit D, 25-Hydroxy: 31 ng/mL (ref 30–100)

## 2015-06-10 LAB — MICROALBUMIN / CREATININE URINE RATIO
CREATININE, URINE: 133.9 mg/dL
Microalb Creat Ratio: 8.2 mg/g (ref 0.0–30.0)
Microalb, Ur: 1.1 mg/dL (ref <2.0)

## 2015-06-10 LAB — TSH: TSH: 4.196 u[IU]/mL (ref 0.350–4.500)

## 2015-06-10 LAB — LIPID PANEL
CHOLESTEROL: 182 mg/dL (ref 0–200)
HDL: 50 mg/dL (ref >=40)
LDL Cholesterol: 108 mg/dL — ABNORMAL HIGH (ref 0–99)
Total CHOL/HDL Ratio: 3.6 Ratio
Triglycerides: 120 mg/dL (ref <150)
VLDL: 24 mg/dL (ref 0–40)

## 2015-06-10 LAB — TESTOSTERONE: TESTOSTERONE: 292 ng/dL — AB (ref 300–890)

## 2015-06-10 LAB — URINALYSIS, MICROSCOPIC ONLY
BACTERIA UA: NONE SEEN
CASTS: NONE SEEN
Crystals: NONE SEEN
Squamous Epithelial / LPF: NONE SEEN

## 2015-06-10 LAB — HEMOGLOBIN A1C
Hgb A1c MFr Bld: 5.4 % (ref <5.7)
Mean Plasma Glucose: 108 mg/dL (ref <117)

## 2015-06-10 LAB — MAGNESIUM: Magnesium: 2 mg/dL (ref 1.5–2.5)

## 2015-06-10 LAB — IRON AND TIBC
%SAT: 36 % (ref 20–55)
Iron: 117 ug/dL (ref 42–165)
TIBC: 328 ug/dL (ref 215–435)
UIBC: 211 ug/dL (ref 125–400)

## 2015-06-10 LAB — VITAMIN B12: Vitamin B-12: 552 pg/mL (ref 211–911)

## 2015-06-10 LAB — INSULIN, RANDOM: INSULIN: 4.6 u[IU]/mL (ref 2.0–19.6)

## 2015-06-10 LAB — PSA: PSA: 0.67 ng/mL (ref <=4.00)

## 2015-06-11 ENCOUNTER — Encounter: Payer: Self-pay | Admitting: Internal Medicine

## 2015-06-27 ENCOUNTER — Other Ambulatory Visit: Payer: Self-pay | Admitting: Physician Assistant

## 2015-07-05 ENCOUNTER — Other Ambulatory Visit: Payer: Self-pay | Admitting: Internal Medicine

## 2015-07-08 ENCOUNTER — Encounter: Payer: Self-pay | Admitting: Emergency Medicine

## 2015-08-25 ENCOUNTER — Other Ambulatory Visit: Payer: Self-pay | Admitting: Internal Medicine

## 2015-09-13 ENCOUNTER — Ambulatory Visit (INDEPENDENT_AMBULATORY_CARE_PROVIDER_SITE_OTHER): Payer: 59 | Admitting: Internal Medicine

## 2015-09-13 ENCOUNTER — Ambulatory Visit: Payer: Self-pay | Admitting: Internal Medicine

## 2015-09-13 VITALS — BP 138/85 | HR 74 | Resp 18 | Wt 231.0 lb

## 2015-09-13 DIAGNOSIS — I10 Essential (primary) hypertension: Secondary | ICD-10-CM

## 2015-09-13 DIAGNOSIS — R7309 Other abnormal glucose: Secondary | ICD-10-CM

## 2015-09-13 DIAGNOSIS — E559 Vitamin D deficiency, unspecified: Secondary | ICD-10-CM

## 2015-09-13 DIAGNOSIS — E782 Mixed hyperlipidemia: Secondary | ICD-10-CM | POA: Diagnosis not present

## 2015-09-13 DIAGNOSIS — Z79899 Other long term (current) drug therapy: Secondary | ICD-10-CM | POA: Diagnosis not present

## 2015-09-13 MED ORDER — AMLODIPINE-OLMESARTAN 5-40 MG PO TABS: 1.0000 | ORAL_TABLET | Freq: Every day | ORAL | Status: DC

## 2015-09-13 NOTE — Progress Notes (Signed)
Patient ID: Samuel Sharp, male   DOB: [birth year], 54 y.o.   MRN: [id number]  Assessment and Plan:  Hypertension:  -Continue medication, azor samples given -monitor blood pressure at home.  -Continue DASH diet.   -Reminder to go to the ER if any CP, SOB, nausea, dizziness, severe HA, changes vision/speech, left arm numbness and tingling, and jaw pain.  Cholesterol: -Continue diet and exercise.  -Check cholesterol.   Diabetes: -Continue diet and exercise.  -Check A1C  Vitamin D Def: -check level -continue medications.   COPD -currently well controlled  -anoro and arnuity samples given  Continue diet and meds as discussed. Further disposition pending results of labs.  HPI 54 y.o. male  presents for 3 month follow up with hypertension, hyperlipidemia, prediabetes and vitamin D.   His blood pressure has been controlled at home, today their BP is BP: (!) 150/100 mmHg.   He does workout. He denies chest pain, shortness of breath, dizziness.  He does have a physical job and does do some lifting.     He is on cholesterol medication and denies myalgias. His cholesterol is at goal. The cholesterol last visit was:   Lab Results  Component Value Date   CHOL 182 06/09/2015   HDL 50 06/09/2015   LDLCALC 108* 06/09/2015   TRIG 120 06/09/2015   CHOLHDL 3.6 06/09/2015     He has been working on diet and exercise for prediabetes, and denies foot ulcerations, hyperglycemia, hypoglycemia , increased appetite, nausea, paresthesia of the feet, polydipsia, polyuria, visual disturbances, vomiting and weight loss. Last A1C in the office was:  Lab Results  Component Value Date   HGBA1C 5.4 06/09/2015    Patient is on Vitamin D supplement.  Lab Results  Component Value Date   VD25OH 31 06/09/2015      Current Medications:  Current Outpatient Prescriptions on File Prior to Visit  Medication Sig Dispense Refill  . atorvastatin (LIPITOR) 40 MG tablet TAKE 1 TABLET (40 MG TOTAL) BY MOUTH DAILY.  30 tablet 3  . AZOR 5-40 MG per tablet TAKE 1 TABLET BY MOUTH EVERY DAY 90 tablet 0  . BREO ELLIPTA 100-25 MCG/INH AEPB USE 1 PUFF INHALED DAILY, RINSE MOUTH WITH WATER AFTER INHALATION 60 each 3  . clobetasol ointment (TEMOVATE) 0.05 % Apply as needed    . Flaxseed, Linseed, (EQL FLAX SEED OIL) 1000 MG CAPS Take 1,000 mg by mouth daily.    Marland Kitchen HUMIRA PEN 40 MG/0.8ML PNKT     . levothyroxine (SYNTHROID) 100 MCG tablet Take 1 tablet (100 mcg total) by mouth daily. 30 tablet 11  . LORazepam (ATIVAN) 2 MG tablet TAKE 1 TABLET BY MOUTH 3 TIMES A DAY AS NEEDED 90 tablet 1  . meloxicam (MOBIC) 15 MG tablet TAKE 1 TABLET BY MOUTH EVERY DAY 90 tablet 1  . metoprolol succinate (TOPROL-XL) 25 MG 24 hr tablet Take 25 mg by mouth.    . metoprolol tartrate (LOPRESSOR) 25 MG tablet TAKE 1 TABLET (25 MG TOTAL) BY MOUTH 2 (TWO) TIMES DAILY. 180 tablet 0  . Multiple Vitamins-Minerals (MULTIVITAMIN WITH MINERALS) tablet Take 1 tablet by mouth daily.    . Omega-3 Fatty Acids (FISH OIL) 1000 MG CAPS Take 1,000 mg by mouth daily.    Marland Kitchen PROAIR HFA 108 (90 BASE) MCG/ACT inhaler INHALE 2 PUFFS INTO THE LUNGS EVERY 4 HOURS AS NEEDED FOR WHEEZING OR SHORTNESS OF BREATH. 18 g 3   No current facility-administered medications on file prior to visit.  Medical History:  Past Medical History  Diagnosis Date  . Asthma     Present all life. Dx as child. Been in [facility] since 2003--pft 06/03/10 FEV1 3.14/ 82%, FEV1/FVC 0.54; insig resp to BD, DLCO 75%, ni lUNG  Vol  . Hyperlipidemia   . Hypertension     on lisinopril 2002/2003--changed ARB march 2011  . Palpitations     started atenolol since 2003. recollects hx of holter. 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 informed it "Not a dangerous condition"-changed to lopressor 12/15/2010  . Pulmonary embolism     does not recollect tpA or being on lot of o2 or hypotension. 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 Psoriasis     on enbre;l since  005. started in [place]. Stopped 2011 by [place] derm due to nodule on chest  . Detached retina   . Lung nodule     5mm on ct 11/10/10- likely lymph node  . History of TB skin testing     negative 2005-2010    Allergies:  Allergies  Allergen Reactions  . Ace Inhibitors     cough  . Haldol [Haloperidol Lactate]     psychosis  . Haloperidol Lactate     REACTION: phsycotic break     Review of Systems:  Review of Systems  Constitutional: Negative for fever, chills and diaphoresis.  HENT: Negative for congestion, ear pain and sore throat.   Eyes: Negative for blurred vision and double vision.  Respiratory: Negative for cough, shortness of breath and wheezing.   Cardiovascular: Negative for chest pain, palpitations and leg swelling.  Gastrointestinal: Positive for heartburn. Negative for diarrhea, constipation, blood in stool and melena.  Genitourinary: Negative.   Neurological: Negative for headaches.    Family history- Review and unchanged  Social history- Review and unchanged  Physical Exam: BP 150/100 mmHg  Pulse 74  Resp 18  Wt 231 lb (104.781 kg) Wt Readings from Last 3 Encounters:  09/13/15 231 lb (104.781 kg)  06/09/15 231 lb (104.781 kg)  01/13/15 235 lb (106.595 kg)    General Appearance: Well nourished well developed, in no apparent distress. Eyes: PERRLA, EOMs, conjunctiva no swelling or erythema ENT/Mouth: Ear canals normal without obstruction, swelling, erythma, discharge.  TMs normal bilaterally.  Oropharynx moist, clear, without exudate, or postoropharyngeal swelling. Neck: Supple, thyroid normal,no cervical adenopathy  Respiratory: Respiratory effort normal, Breath sounds clear A&P without rhonchi, wheeze, or rale.  No retractions, no accessory usage. Cardio: RRR with no MRGs. Brisk peripheral pulses without edema.  Abdomen: Soft, + BS,  Non tender, no guarding, rebound, hernias, masses. Musculoskeletal: Full ROM, 5/5 strength, Normal gait Skin: Warm, dry  without rashes, lesions, ecchymosis.  Neuro: Awake and oriented X 3, Cranial nerves intact. Normal muscle tone, no cerebellar symptoms. Psych: Normal affect, Insight and Judgment appropriate.    Terri Piedra, PA-C 2:56 PM [facility],

## 2015-09-14 ENCOUNTER — Other Ambulatory Visit: Payer: Self-pay | Admitting: Internal Medicine

## 2015-09-14 LAB — CBC WITH DIFFERENTIAL/PLATELET
Basophils Absolute: 0.1 10*3/uL (ref 0.0–0.1)
Basophils Relative: 1 % (ref 0–1)
EOS ABS: 0.5 10*3/uL (ref 0.0–0.7)
EOS PCT: 6 % — AB (ref 0–5)
HCT: 38.3 % — ABNORMAL LOW (ref 39.0–52.0)
Hemoglobin: 12.8 g/dL — ABNORMAL LOW (ref 13.0–17.0)
LYMPHS PCT: 24 % (ref 12–46)
Lymphs Abs: 2 10*3/uL (ref 0.7–4.0)
MCH: 32.2 pg (ref 26.0–34.0)
MCHC: 33.4 g/dL (ref 30.0–36.0)
MCV: 96.2 fL (ref 78.0–100.0)
MONO ABS: 0.9 10*3/uL (ref 0.1–1.0)
MPV: 9 fL (ref 8.6–12.4)
Monocytes Relative: 11 % (ref 3–12)
Neutro Abs: 4.8 10*3/uL (ref 1.7–7.7)
Neutrophils Relative %: 58 % (ref 43–77)
Platelets: 266 10*3/uL (ref 150–400)
RBC: 3.98 MIL/uL — AB (ref 4.22–5.81)
RDW: 13 % (ref 11.5–15.5)
WBC: 8.2 10*3/uL (ref 4.0–10.5)

## 2015-09-14 LAB — HEPATIC FUNCTION PANEL
ALBUMIN: 4.2 g/dL (ref 3.6–5.1)
ALK PHOS: 62 U/L (ref 40–115)
ALT: 26 U/L (ref 9–46)
AST: 24 U/L (ref 10–35)
BILIRUBIN INDIRECT: 0.4 mg/dL (ref 0.2–1.2)
Bilirubin, Direct: 0.1 mg/dL (ref <=0.2)
TOTAL PROTEIN: 7 g/dL (ref 6.1–8.1)
Total Bilirubin: 0.5 mg/dL (ref 0.2–1.2)

## 2015-09-14 LAB — BASIC METABOLIC PANEL WITH GFR
BUN: 26 mg/dL — ABNORMAL HIGH (ref 7–25)
CO2: 23 mmol/L (ref 20–31)
Calcium: 9.5 mg/dL (ref 8.6–10.3)
Chloride: 105 mmol/L (ref 98–110)
Creat: 1.11 mg/dL (ref 0.70–1.33)
GFR, EST NON AFRICAN AMERICAN: 74 mL/min (ref >=60)
GFR, Est African American: 85 mL/min (ref >=60)
GLUCOSE: 84 mg/dL (ref 65–99)
POTASSIUM: 5.1 mmol/L (ref 3.5–5.3)
Sodium: 141 mmol/L (ref 135–146)

## 2015-09-14 LAB — LIPID PANEL
CHOL/HDL RATIO: 3.2 ratio (ref <=5.0)
CHOLESTEROL: 168 mg/dL (ref 125–200)
HDL: 53 mg/dL (ref >=40)
LDL Cholesterol: 88 mg/dL (ref <130)
Triglycerides: 137 mg/dL (ref <150)
VLDL: 27 mg/dL (ref <30)

## 2015-09-14 LAB — MAGNESIUM: MAGNESIUM: 2 mg/dL (ref 1.5–2.5)

## 2015-09-14 LAB — VITAMIN D 25 HYDROXY (VIT D DEFICIENCY, FRACTURES): VIT D 25 HYDROXY: 81 ng/mL (ref 30–100)

## 2015-09-14 LAB — INSULIN, RANDOM: Insulin: 5 u[IU]/mL (ref 2.0–19.6)

## 2015-09-14 LAB — HEMOGLOBIN A1C
Hgb A1c MFr Bld: 5.4 % (ref <5.7)
Mean Plasma Glucose: 108 mg/dL (ref <117)

## 2015-09-14 LAB — TSH: TSH: 6.341 u[IU]/mL — AB (ref 0.350–4.500)

## 2015-09-14 MED ORDER — LEVOTHYROXINE SODIUM 112 MCG PO TABS: 112.0000 ug | ORAL_TABLET | Freq: Every day | ORAL | Status: DC

## 2015-09-28 ENCOUNTER — Other Ambulatory Visit: Payer: Self-pay | Admitting: Physician Assistant

## 2015-09-28 ENCOUNTER — Other Ambulatory Visit: Payer: Self-pay | Admitting: Emergency Medicine

## 2015-09-28 DIAGNOSIS — E039 Hypothyroidism, unspecified: Secondary | ICD-10-CM

## 2015-10-11 ENCOUNTER — Other Ambulatory Visit: Payer: Self-pay | Admitting: Internal Medicine

## 2015-11-15 ENCOUNTER — Other Ambulatory Visit: Payer: Self-pay | Admitting: Physician Assistant

## 2015-11-24 ENCOUNTER — Other Ambulatory Visit: Payer: Self-pay | Admitting: Internal Medicine

## 2015-12-13 ENCOUNTER — Ambulatory Visit (INDEPENDENT_AMBULATORY_CARE_PROVIDER_SITE_OTHER): Payer: 59 | Admitting: Internal Medicine

## 2015-12-13 ENCOUNTER — Encounter: Payer: Self-pay | Admitting: Internal Medicine

## 2015-12-13 VITALS — BP 154/84 | HR 72 | Temp 97.5°F | Resp 16 | Ht 73.25 in | Wt 228.6 lb

## 2015-12-13 DIAGNOSIS — Z79899 Other long term (current) drug therapy: Secondary | ICD-10-CM

## 2015-12-13 DIAGNOSIS — J4520 Mild intermittent asthma, uncomplicated: Secondary | ICD-10-CM

## 2015-12-13 DIAGNOSIS — I10 Essential (primary) hypertension: Secondary | ICD-10-CM | POA: Diagnosis not present

## 2015-12-13 DIAGNOSIS — R7309 Other abnormal glucose: Secondary | ICD-10-CM | POA: Diagnosis not present

## 2015-12-13 DIAGNOSIS — E559 Vitamin D deficiency, unspecified: Secondary | ICD-10-CM | POA: Diagnosis not present

## 2015-12-13 DIAGNOSIS — M7582 Other shoulder lesions, left shoulder: Secondary | ICD-10-CM | POA: Diagnosis not present

## 2015-12-13 DIAGNOSIS — E782 Mixed hyperlipidemia: Secondary | ICD-10-CM

## 2015-12-13 MED ORDER — PREDNISONE 20 MG PO TABS: ORAL_TABLET | ORAL | Status: AC

## 2015-12-13 MED ORDER — BECLOMETHASONE DIPROPIONATE 80 MCG/ACT IN AERS: INHALATION_SPRAY | RESPIRATORY_TRACT | Status: DC

## 2015-12-13 NOTE — Patient Instructions (Signed)

## 2015-12-13 NOTE — Progress Notes (Signed)
Patient ID: Samuel Sharp, male   DOB: June 19, 1961, 54 y.o.   MRN: [id number]   This very nice 54 y.o.male presents for 3 month follow up with Hypertension, Hyperlipidemia, Pre-Diabetes, Hypothyroidism, Asthma and Vitamin D Deficiency. Other problems include Psoriasis treated with [facility]eria per Dr Elmon Else. Today patient also relates a several week hx of L shoulder pains with ROM and it's compromising his work performance   Patient is treated for HTN since 2000  & BP has been controlled at home. Today's BP: (!) 154/84 mmHg. Patient has had no complaints of any cardiac type chest pain, palpitations, dyspnea/orthopnea/PND, dizziness, claudication, or dependent edema.   Hyperlipidemia is controlled with diet & meds. Patient denies myalgias or other med SE's. Last Lipids were at goal with Cholesterol 168; HDL 53; LDL 88; Triglycerides 137 on 09/13/2015.   Also, the patient is is morbidly Obese (BMI 30) and is proactively monitored for PreDiabetes and has had no symptoms of reactive hypoglycemia, diabetic polys, paresthesias or visual blurring.  Last A1c was  5.4% on 09/13/2015.   Further, the patient also has history of Vitamin D Deficiency of 24 in Jan 2016 and supplements vitamin D without any suspected side-effects. Last vitamin D was 81 on 09/13/2015.  Medication Sig  . amLODipine-olmesartan (AZOR) 5-40 MG per tablet Take 1 tablet by mouth daily.  Marland Kitchen atorvastatin (LIPITOR) 40 MG tablet TAKE 1 TABLET BY MOUTH EVERY DAY  . Anora ELLIPTA  USE 1 PUFF INHALED DAILY  . clobetasol oint (TEMOVATE) 0.05 % Apply as needed  . Flaxseed OIL 1000 MG CAPS Take 1,000 mg by mouth daily.  Marland Kitchen HUMIRA PEN 40 MG/0.8ML PNKT   . levothyroxine  100 MCG tablet TAKE 1 TABLET (100 MCG TOTAL) BY MOUTH DAILY.  Marland Kitchen LORazepam  2 MG tablet TAKE 1/2 TO 1 TAB3 TIMES A DAY AS NEEDED   . metoprolol tartrate  25 MG TAKE 1 TAB 2  TIMES DAILY.  . Multiple Vitamins-Minerals  Take 1 tablet by mouth daily.  . Omega-3FISH OIL 1000 MG  Take 1,000 mg  by mouth daily.  Marland Kitchen PROAIR HFA   inhaler INHALE 2 PUFFS  EVERY 4 HOURS AS NEEDED  . meloxicam  15 MG tablet TAKE 1 TABLET BY MOUTH EVERY DAY   Allergies  Allergen Reactions  . Ace Inhibitors     cough  . Haldol [Haloperidol Lactate]     psychosis  . Haloperidol Lactate     REACTION: phsycotic break   PMHx:   Past Medical History  Diagnosis Date  . Asthma     Present all life. Dx as child. Been in [facility] since 2003--pft 06/03/10 FEV1 3.14/ 82%, FEV1/FVC 0.54; insig resp to BD, DLCO 75%, ni lUNG  Vol  . Hyperlipidemia   . Hypertension     on lisinopril 2002/2003--changed ARB march 2011  . Palpitations     started atenolol since 2003. recollects hx of holter. 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 informed it "Not a dangerous condition"-changed to lopressor 12/15/2010  . Pulmonary embolism (HCC)     does not recollect tpA or being on lot of o2 or hypotension. 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 Psoriasis     on enbre;l since 005. started in [place]. Stopped 2011 by [place] derm due to nodule on chest  . Detached retina   . Lung nodule     5mm on ct 11/10/10- likely lymph node  . History of  TB skin testing     negative 2005-2010   Immunization History  Administered Date(s) Administered  . Influenza Split 11/15/2012  . Influenza Whole 09/24/2010  . Pneumococcal Polysaccharide-23 06/03/2010   Past Surgical History  Procedure Laterality Date  . Retinal detachment surgery    . Video bronchoscopy  07/26/2012    Procedure: VIDEO BRONCHOSCOPY WITH FLUORO;  Surgeon: Alyson ReedyWesam G Yacoub, MD;  [facility] END[facility];  Service: Cardiopulmonary;  Laterality: Bilateral;   FHx:    Reviewed / unchanged  SHx:    Reviewed / unchanged  Systems Review:  Constitutional: Denies fever, chills, wt changes, headaches, insomnia, fatigue, night sweats, change in appetite. Eyes: Denies redness, blurred vision, diplopia, discharge, itchy, watery eyes.  ENT: Denies  discharge, congestion, post nasal drip, epistaxis, sore throat, earache, hearing loss, dental pain, tinnitus, vertigo, sinus pain, snoring.  CV: Denies chest pain, palpitations, irregular heartbeat, syncope, dyspnea, diaphoresis, orthopnea, PND, claudication or edema. Respiratory: denies cough, dyspnea, DOE, pleurisy, hoarseness, laryngitis, wheezing.  Gastrointestinal: Denies dysphagia, odynophagia, heartburn, reflux, water brash, abdominal pain or cramps, nausea, vomiting, bloating, diarrhea, constipation, hematemesis, melena, hematochezia  or hemorrhoids. Genitourinary: Denies dysuria, frequency, urgency, nocturia, hesitancy, discharge, hematuria or flank pain. Musculoskeletal: Denies arthralgias, myalgias, stiffness, jt. swelling, pain, limping or strain/sprain.  Skin: Denies pruritus, rash, hives, warts, acne, eczema or change in skin lesion(s). Neuro: No weakness, tremor, incoordination, spasms, paresthesia or pain.  Psychiatric: Denies confusion, memory loss or sensory loss. Endo: Denies change in weight, skin or hair change.  Heme/Lymph: No excessive bleeding, bruising or enlarged lymph nodes.  Physical Exam  BP 154/84 mmHg  Pulse 72  Temp(Src) 97.5 F (36.4 C)  Resp 16  Ht 6' 1.25" (1.861 m)  Wt 228 lb 9.6 oz (103.692 kg)  BMI 29.94 kg/m2  Appears well nourished and in no distress. Eyes: PERRLA, EOMs, conjunctiva no swelling or erythema. Sinuses: No frontal/maxillary tenderness ENT/Mouth: EAC's clear, TM's nl w/o erythema, bulging. Nares clear w/o erythema, swelling, exudates. Oropharynx clear without erythema or exudates. Oral hygiene is good. Tongue normal, non obstructing. Hearing intact.  Neck: Supple. Thyroid nl. Car 2+/2+ without bruits, nodes or JVD. Chest: Respirations nl with BS clear & equal w/o rales, rhonchi, wheezing or stridor.  Cor: Heart sounds normal w/ regular rate and rhythm without sig. murmurs, gallops, clicks, or rubs. Peripheral pulses normal and equal   without edema.  Abdomen: Soft & bowel sounds normal. Non-tender w/o guarding, rebound, hernias, masses, or organomegaly.  Lymphatics: Unremarkable.  Musculoskeletal: Full ROM all peripheral extremities, joint stability, 5/5 strength, and normal gait. Decreased abduction and internal/external rotation of the L shoulder and tender along the L shoulder anterior joint line.   Skin: Warm, dry without exposed rashes, lesions or ecchymosis apparent.  Neuro: Cranial nerves intact, reflexes equal bilaterally. Sensory-motor testing grossly intact. Tendon reflexes grossly intact.  Pysch: Alert & oriented x 3.  Insight and judgement nl & appropriate. No ideations.  Assessment and Plan:  1. Essential hypertension  - TSH  2. Mixed hyperlipidemia  - Lipid panel - TSH  3. Abnormal glucose  - Hemoglobin A1c - Insulin, random  4. Vitamin D deficiency  - VITAMIN D 25 Hydroxy   5. Asthma, mild intermittent, uncomplicated  - beclomethasone (QVAR) 80 MCG/ACT inhaler; 2 inhalations daily  Dispense: 1 Inhaler; Refill: 12 (Formulary change)  6. Rotator cuff tendonitis, left  - predniSONE (DELTASONE) 20 MG tablet; Take 1 tablet 3 x day for 5 days , then 2 x day for 5 days  Dispense: 30 tablet; Refill: 0  7. Medication management  - CBC with Differential/Platelet - BASIC METABOLIC PANEL WITH GFR - Hepatic function panel - Magnesium   Recommended regular exercise, BP monitoring, weight control, and discussed med and SE's. Recommended labs to assess and monitor clinical status. Further disposition pending results of labs. Over 30 minutes of exam, counseling, chart review was performed

## 2015-12-14 LAB — BASIC METABOLIC PANEL WITH GFR
BUN: 22 mg/dL (ref 7–25)
CALCIUM: 9.2 mg/dL (ref 8.6–10.3)
CO2: 23 mmol/L (ref 20–31)
CREATININE: 0.88 mg/dL (ref 0.70–1.33)
Chloride: 104 mmol/L (ref 98–110)
GFR, Est African American: >89 mL/min (ref >=60)
GFR, Est Non African American: >89 mL/min (ref >=60)
Glucose, Bld: 77 mg/dL (ref 65–99)
Potassium: 4.2 mmol/L (ref 3.5–5.3)
SODIUM: 137 mmol/L (ref 135–146)

## 2015-12-14 LAB — LIPID PANEL
CHOLESTEROL: 188 mg/dL (ref 125–200)
HDL: 45 mg/dL (ref >=40)
LDL Cholesterol: 82 mg/dL (ref <130)
Total CHOL/HDL Ratio: 4.2 Ratio (ref <=5.0)
Triglycerides: 306 mg/dL — ABNORMAL HIGH (ref <150)
VLDL: 61 mg/dL — ABNORMAL HIGH (ref <30)

## 2015-12-14 LAB — CBC WITH DIFFERENTIAL/PLATELET
BASOS PCT: 1 % (ref 0–1)
Basophils Absolute: 0.1 10*3/uL (ref 0.0–0.1)
EOS ABS: 0.4 10*3/uL (ref 0.0–0.7)
Eosinophils Relative: 5 % (ref 0–5)
HCT: 38.6 % — ABNORMAL LOW (ref 39.0–52.0)
HEMOGLOBIN: 13 g/dL (ref 13.0–17.0)
LYMPHS ABS: 1.8 10*3/uL (ref 0.7–4.0)
Lymphocytes Relative: 22 % (ref 12–46)
MCH: 32.1 pg (ref 26.0–34.0)
MCHC: 33.7 g/dL (ref 30.0–36.0)
MCV: 95.3 fL (ref 78.0–100.0)
MONO ABS: 0.7 10*3/uL (ref 0.1–1.0)
MONOS PCT: 9 % (ref 3–12)
MPV: 9.4 fL (ref 8.6–12.4)
NEUTROS ABS: 5 10*3/uL (ref 1.7–7.7)
Neutrophils Relative %: 63 % (ref 43–77)
Platelets: 287 10*3/uL (ref 150–400)
RBC: 4.05 MIL/uL — ABNORMAL LOW (ref 4.22–5.81)
RDW: 12.6 % (ref 11.5–15.5)
WBC: 8 10*3/uL (ref 4.0–10.5)

## 2015-12-14 LAB — HEPATIC FUNCTION PANEL
ALBUMIN: 3.9 g/dL (ref 3.6–5.1)
ALT: 24 U/L (ref 9–46)
AST: 19 U/L (ref 10–35)
Alkaline Phosphatase: 60 U/L (ref 40–115)
BILIRUBIN DIRECT: 0.1 mg/dL (ref <=0.2)
Indirect Bilirubin: 0.3 mg/dL (ref 0.2–1.2)
TOTAL PROTEIN: 6.6 g/dL (ref 6.1–8.1)
Total Bilirubin: 0.4 mg/dL (ref 0.2–1.2)

## 2015-12-14 LAB — HEMOGLOBIN A1C
Hgb A1c MFr Bld: 5.2 % (ref <5.7)
MEAN PLASMA GLUCOSE: 103 mg/dL (ref <117)

## 2015-12-14 LAB — VITAMIN D 25 HYDROXY (VIT D DEFICIENCY, FRACTURES): Vit D, 25-Hydroxy: 59 ng/mL (ref 30–100)

## 2015-12-14 LAB — MAGNESIUM: MAGNESIUM: 1.8 mg/dL (ref 1.5–2.5)

## 2015-12-14 LAB — INSULIN, RANDOM: Insulin: 4.5 u[IU]/mL (ref 2.0–19.6)

## 2015-12-14 LAB — TSH: TSH: 5.051 u[IU]/mL — ABNORMAL HIGH (ref 0.350–4.500)

## 2016-01-18 ENCOUNTER — Other Ambulatory Visit: Payer: Self-pay | Admitting: Internal Medicine

## 2016-01-20 ENCOUNTER — Other Ambulatory Visit: Payer: Self-pay | Admitting: Physician Assistant

## 2016-03-14 ENCOUNTER — Ambulatory Visit: Payer: Self-pay | Admitting: Physician Assistant

## 2016-03-16 ENCOUNTER — Other Ambulatory Visit: Payer: Self-pay | Admitting: Internal Medicine

## 2016-03-16 DIAGNOSIS — F411 Generalized anxiety disorder: Secondary | ICD-10-CM

## 2016-03-20 ENCOUNTER — Encounter: Payer: Self-pay | Admitting: Internal Medicine

## 2016-03-20 ENCOUNTER — Ambulatory Visit: Payer: 59 | Admitting: Internal Medicine

## 2016-03-20 VITALS — BP 126/80 | HR 76 | Temp 97.3°F | Resp 16 | Ht 73.25 in | Wt 226.4 lb

## 2016-03-20 DIAGNOSIS — M25512 Pain in left shoulder: Secondary | ICD-10-CM

## 2016-03-20 NOTE — Progress Notes (Signed)
Subjective:    Patient ID: Samuel Sharp, male    DOB: 07/28/1961, 55 y.o.   MRN: [id number]  HPI  55 yo RH dominant WM with hx/o L shoulder pain predating to Dec 2016,  treated with Prednisone (had concomitant sinusitis/tracheitis) - Sx's have persisted & worsened and prevent him from doing his job which requires lifting with both arms.   Outpatient Prescriptions Prior to Visit  Medication Sig Dispense Refill  . ANORO ELLIPTA 62.5-25 MCG/INH AEPB     . atorvastatin (LIPITOR) 40 MG tablet TAKE 1 TABLET BY MOUTH EVERY DAY 90 tablet 1  . AZOR 5-40 MG tablet TAKE 1 TABLET BY MOUTH EVERY DAY 90 tablet 0  . beclomethasone (QVAR) 80 MCG/ACT inhaler 2 inhalations daily 1 Inhaler 12  . clobetasol ointment (TEMOVATE) 0.05 % Apply as needed    . Flaxseed, Linseed, (EQL FLAX SEED OIL) 1000 MG CAPS Take 1,000 mg by mouth daily.    Marland Kitchen. HUMIRA PEN 40 MG/0.8ML PNKT     . levothyroxine (SYNTHROID, LEVOTHROID) 100 MCG tablet TAKE 1 TABLET (100 MCG TOTAL) BY MOUTH DAILY. 90 tablet 1  . LORazepam (ATIVAN) 2 MG tablet TAKE 1 TABLET BY MOUTH 3 TIMES A DAY 90 tablet 0  . metoprolol tartrate (LOPRESSOR) 25 MG tablet TAKE 1 TABLET (25 MG TOTAL) BY MOUTH 2 (TWO) TIMES DAILY. 180 tablet 2  . Multiple Vitamins-Minerals (MULTIVITAMIN WITH MINERALS) tablet Take 1 tablet by mouth daily.    . Omega-3 Fatty Acids (FISH OIL) 1000 MG CAPS Take 1,000 mg by mouth daily.    Marland Kitchen. PROAIR HFA 108 (90 BASE) MCG/ACT inhaler INHALE 2 PUFFS INTO THE LUNGS EVERY 4 HOURS AS NEEDED FOR WHEEZING OR SHORTNESS OF BREATH. 18 g 3   No facility-administered medications prior to visit.   Allergies  Allergen Reactions  . Ace Inhibitors     cough  . Haldol [Haloperidol Lactate]     psychosis  . Haloperidol Lactate     REACTION: phsycotic break   Past Medical History  Diagnosis Date  . Asthma     Present all life. Dx as child. Been in [facility] since 2003--pft 06/03/10 FEV1 3.14/ 82%, FEV1/FVC 0.54; insig resp to BD, DLCO 75%, ni lUNG  Vol  .  Hyperlipidemia   . Hypertension     on lisinopril 2002/2003--changed ARB march 2011  . Palpitations     started atenolol since 2003. recollects hx of holter. 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 informed it "Not a dangerous condition"-changed to lopressor 12/15/2010  . Pulmonary embolism (HCC)     does not recollect tpA or being on lot of o2 or hypotension. 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 Psoriasis     on enbre;l since 005. started in [place]. Stopped 2011 by [place] derm due to nodule on chest  . Detached retina   . Lung nodule     5mm on ct 11/10/10- likely lymph node  . History of TB skin testing     negative 2005-2010   Review of Systems  10 point systems review negative except as above.    Objective:   Physical Exam  BP 126/80 mmHg  Pulse 76  Temp(Src) 97.3 F (36.3 C)  Resp 16  Ht 6' 1.25" (1.861 m)  Wt 226 lb 6.4 oz (102.694 kg)  BMI 29.65 kg/m2  Focused exam shows L shoulder abduction and both internal/external rotation severely restricted due to pain. Sensory testing of  the LUE is WNL     Assessment & Plan:   1. Left shoulder pain  - Ambulatory referral to Orthopedic Surgery  (No Charge OV)

## 2016-03-28 ENCOUNTER — Encounter: Payer: Self-pay | Admitting: Physician Assistant

## 2016-03-28 ENCOUNTER — Ambulatory Visit (INDEPENDENT_AMBULATORY_CARE_PROVIDER_SITE_OTHER): Payer: 59 | Admitting: Physician Assistant

## 2016-03-28 VITALS — BP 136/82 | HR 88 | Temp 97.8°F | Resp 18 | Ht 73.25 in | Wt 226.0 lb

## 2016-03-28 DIAGNOSIS — R7309 Other abnormal glucose: Secondary | ICD-10-CM

## 2016-03-28 DIAGNOSIS — E559 Vitamin D deficiency, unspecified: Secondary | ICD-10-CM

## 2016-03-28 DIAGNOSIS — I10 Essential (primary) hypertension: Secondary | ICD-10-CM

## 2016-03-28 DIAGNOSIS — E782 Mixed hyperlipidemia: Secondary | ICD-10-CM

## 2016-03-28 DIAGNOSIS — J4520 Mild intermittent asthma, uncomplicated: Secondary | ICD-10-CM | POA: Diagnosis not present

## 2016-03-28 DIAGNOSIS — Z79899 Other long term (current) drug therapy: Secondary | ICD-10-CM | POA: Diagnosis not present

## 2016-03-28 LAB — CBC WITH DIFFERENTIAL/PLATELET
BASOS PCT: 0 %
Basophils Absolute: 0 cells/uL (ref 0–200)
EOS PCT: 6 %
Eosinophils Absolute: 552 cells/uL — ABNORMAL HIGH (ref 15–500)
HCT: 40.3 % (ref 38.5–50.0)
Hemoglobin: 13.8 g/dL (ref 13.2–17.1)
LYMPHS PCT: 28 %
Lymphs Abs: 2576 cells/uL (ref 850–3900)
MCH: 32.5 pg (ref 27.0–33.0)
MCHC: 34.2 g/dL (ref 32.0–36.0)
MCV: 94.8 fL (ref 80.0–100.0)
MONOS PCT: 11 %
MPV: 8.9 fL (ref 7.5–12.5)
Monocytes Absolute: 1012 cells/uL — ABNORMAL HIGH (ref 200–950)
NEUTROS ABS: 5060 {cells}/uL (ref 1500–7800)
Neutrophils Relative %: 55 %
PLATELETS: 277 10*3/uL (ref 140–400)
RBC: 4.25 MIL/uL (ref 4.20–5.80)
RDW: 12.8 % (ref 11.0–15.0)
WBC: 9.2 10*3/uL (ref 3.8–10.8)

## 2016-03-28 NOTE — Progress Notes (Signed)
Patient ID: Samuel Sharp, male   DOB: Oct 05, 1961, 55 y.o.   MRN: [id number]  Assessment and Plan:  Hypertension:  -Continue medication, azor samples given -monitor blood pressure at home.  -Continue DASH diet.   -Reminder to go to the ER if any CP, SOB, nausea, dizziness, severe HA, changes vision/speech, left arm numbness and tingling, and jaw pain.  Cholesterol: -Continue diet and exercise.  -Check cholesterol.   Hypothyroidism -check TSH level, continue medications the same, reminded to take on an empty stomach 30-31mins before food.   Obesity with co morbidities - long discussion about weight loss, diet, and exercise  Vitamin D Def: -check level -continue medications.   COPD -currently well controlled  -anoro and arnuity samples given  Continue diet and meds as discussed. Further disposition pending results of labs.  HPI 55 y.o. male  presents for 3 month follow up with hypertension, hyperlipidemia, prediabetes and vitamin D.   His blood pressure has been controlled at home, today their BP is BP: 136/82 mmHg.   He does workout. He denies chest pain, shortness of breath, dizziness.  He does have a physical job and does do some lifting. Had 2 injections and a week out of work due to possible left RTC partial tear, states it is feeling better.   He has asthma, he is on anoro and Qvar, will use albuterol 2 x daily with allergies.   He is on thyroid medication. His medication was changed last visit however .   Lab Results  Component Value Date   TSH 5.051* 12/13/2015    He is on cholesterol medication and denies myalgias. His cholesterol is at goal. The cholesterol last visit was:   Lab Results  Component Value Date   CHOL 188 12/13/2015   HDL 45 12/13/2015   LDLCALC 82 12/13/2015   TRIG 306* 12/13/2015   CHOLHDL 4.2 12/13/2015    He has been working on diet and exercise for prediabetes, and denies foot ulcerations, hyperglycemia, hypoglycemia , increased appetite,  nausea, paresthesia of the feet, polydipsia, polyuria, visual disturbances, vomiting and weight loss. Last A1C in the office was:  Lab Results  Component Value Date   HGBA1C 5.2 12/13/2015   Patient is on Vitamin D supplement.  Lab Results  Component Value Date   VD25OH 70 12/13/2015      Current Medications:  Current Outpatient Prescriptions on File Prior to Visit  Medication Sig Dispense Refill  . ANORO ELLIPTA 62.5-25 MCG/INH AEPB     . atorvastatin (LIPITOR) 40 MG tablet TAKE 1 TABLET BY MOUTH EVERY DAY 90 tablet 1  . AZOR 5-40 MG tablet TAKE 1 TABLET BY MOUTH EVERY DAY 90 tablet 0  . beclomethasone (QVAR) 80 MCG/ACT inhaler 2 inhalations daily 1 Inhaler 12  . clobetasol ointment (TEMOVATE) 0.05 % Apply as needed    . Flaxseed, Linseed, (EQL FLAX SEED OIL) 1000 MG CAPS Take 1,000 mg by mouth daily.    Marland Kitchen HUMIRA PEN 40 MG/0.8ML PNKT     . levothyroxine (SYNTHROID, LEVOTHROID) 100 MCG tablet TAKE 1 TABLET (100 MCG TOTAL) BY MOUTH DAILY. 90 tablet 1  . LORazepam (ATIVAN) 2 MG tablet TAKE 1 TABLET BY MOUTH 3 TIMES A DAY 90 tablet 0  . metoprolol tartrate (LOPRESSOR) 25 MG tablet TAKE 1 TABLET (25 MG TOTAL) BY MOUTH 2 (TWO) TIMES DAILY. 180 tablet 2  . Multiple Vitamins-Minerals (MULTIVITAMIN WITH MINERALS) tablet Take 1 tablet by mouth daily.    . Omega-3 Fatty Acids (FISH OIL)  1000 MG CAPS Take 1,000 mg by mouth daily.    Marland Kitchen. PROAIR HFA 108 (90 BASE) MCG/ACT inhaler INHALE 2 PUFFS INTO THE LUNGS EVERY 4 HOURS AS NEEDED FOR WHEEZING OR SHORTNESS OF BREATH. 18 g 3   No current facility-administered medications on file prior to visit.    Medical History:  Past Medical History  Diagnosis Date  . Asthma     Present all life. Dx as child. Been in [facility] since 2003--pft 06/03/10 FEV1 3.14/ 82%, FEV1/FVC 0.54; insig resp to BD, DLCO 75%, ni lUNG  Vol  . Hyperlipidemia   . Hypertension     on lisinopril 2002/2003--changed ARB march 2011  . Palpitations     started atenolol since 2003.  recollects hx of holter. 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 informed it "Not a dangerous condition"-changed to lopressor 12/15/2010  . Pulmonary embolism (HCC)     does not recollect tpA or being on lot of o2 or hypotension. 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 Psoriasis     on enbre;l since 005. started in [place]. Stopped 2011 by [place] derm due to nodule on chest  . Detached retina   . Lung nodule     5mm on ct 11/10/10- likely lymph node  . History of TB skin testing     negative 2005-2010    Allergies:  Allergies  Allergen Reactions  . Ace Inhibitors     cough  . Haldol [Haloperidol Lactate]     psychosis  . Haloperidol Lactate     REACTION: phsycotic break     Review of Systems:  Review of Systems  Constitutional: Negative for fever, chills and diaphoresis.  HENT: Negative for congestion, ear pain and sore throat.   Eyes: Negative for blurred vision and double vision.  Respiratory: Negative for cough, shortness of breath and wheezing.   Cardiovascular: Negative for chest pain, palpitations and leg swelling.  Gastrointestinal: Positive for heartburn. Negative for diarrhea, constipation, blood in stool and melena.  Genitourinary: Negative.   Neurological: Negative for headaches.    Family history- Review and unchanged  Social history- Review and unchanged  Physical Exam: BP 136/82 mmHg  Pulse 88  Temp(Src) 97.8 F (36.6 C) (Temporal)  Resp 18  Ht 6' 1.25" (1.861 m)  Wt 226 lb (102.513 kg)  BMI 29.60 kg/m2 Wt Readings from Last 3 Encounters:  03/28/16 226 lb (102.513 kg)  03/20/16 226 lb 6.4 oz (102.694 kg)  12/13/15 228 lb 9.6 oz (103.692 kg)    General Appearance: Well nourished well developed, in no apparent distress. Eyes: PERRLA, EOMs, conjunctiva no swelling or erythema ENT/Mouth: Ear canals normal without obstruction, swelling, erythma, discharge.  TMs normal bilaterally.  Oropharynx moist, clear, without  exudate, or postoropharyngeal swelling. Neck: Supple, thyroid normal,no cervical adenopathy  Respiratory: Respiratory effort normal, Breath sounds clear A&P without rhonchi, wheeze, or rale.  No retractions, no accessory usage. Cardio: RRR with no MRGs. Brisk peripheral pulses without edema.  Abdomen: Soft, + BS,  Non tender, no guarding, rebound, hernias, masses. Musculoskeletal: Full ROM, 5/5 strength, Normal gait Skin: Warm, dry without rashes, lesions, ecchymosis.  Neuro: Awake and oriented X 3, Cranial nerves intact. Normal muscle tone, no cerebellar symptoms. Psych: Normal affect, Insight and Judgment appropriate.    Quentin MullingAmanda Demaryius Imran, PA-C 3:49 PM [facility],

## 2016-03-28 NOTE — Patient Instructions (Signed)

## 2016-03-29 LAB — BASIC METABOLIC PANEL WITH GFR
BUN: 41 mg/dL — ABNORMAL HIGH (ref 7–25)
CALCIUM: 9.4 mg/dL (ref 8.6–10.3)
CO2: 22 mmol/L (ref 20–31)
CREATININE: 1.15 mg/dL (ref 0.70–1.33)
Chloride: 102 mmol/L (ref 98–110)
GFR, EST AFRICAN AMERICAN: 82 mL/min (ref >=60)
GFR, EST NON AFRICAN AMERICAN: 71 mL/min (ref >=60)
GLUCOSE: 71 mg/dL (ref 65–99)
Potassium: 4.8 mmol/L (ref 3.5–5.3)
SODIUM: 136 mmol/L (ref 135–146)

## 2016-03-29 LAB — LIPID PANEL
CHOL/HDL RATIO: 3.6 ratio (ref <=5.0)
CHOLESTEROL: 217 mg/dL — AB (ref 125–200)
HDL: 61 mg/dL (ref >=40)
LDL Cholesterol: 112 mg/dL (ref <130)
TRIGLYCERIDES: 221 mg/dL — AB (ref <150)
VLDL: 44 mg/dL — AB (ref <30)

## 2016-03-29 LAB — MAGNESIUM: MAGNESIUM: 2 mg/dL (ref 1.5–2.5)

## 2016-03-29 LAB — HEPATIC FUNCTION PANEL
ALT: 78 U/L — AB (ref 9–46)
AST: 28 U/L (ref 10–35)
Albumin: 4.1 g/dL (ref 3.6–5.1)
Alkaline Phosphatase: 70 U/L (ref 40–115)
BILIRUBIN DIRECT: 0.1 mg/dL (ref <=0.2)
BILIRUBIN INDIRECT: 0.5 mg/dL (ref 0.2–1.2)
TOTAL PROTEIN: 6.9 g/dL (ref 6.1–8.1)
Total Bilirubin: 0.6 mg/dL (ref 0.2–1.2)

## 2016-03-29 LAB — VITAMIN D 25 HYDROXY (VIT D DEFICIENCY, FRACTURES): VIT D 25 HYDROXY: 64 ng/mL (ref 30–100)

## 2016-03-29 LAB — TSH: TSH: 3.82 mIU/L (ref 0.40–4.50)

## 2016-04-17 ENCOUNTER — Other Ambulatory Visit: Payer: Self-pay | Admitting: Internal Medicine

## 2016-05-08 ENCOUNTER — Other Ambulatory Visit: Payer: Self-pay | Admitting: Internal Medicine

## 2016-05-15 ENCOUNTER — Other Ambulatory Visit: Payer: Self-pay | Admitting: Internal Medicine

## 2016-05-31 ENCOUNTER — Telehealth: Payer: Self-pay | Admitting: Physician Assistant

## 2016-05-31 NOTE — Telephone Encounter (Signed)
Patient called wanting renewal of hydrocodone, has seen ortho in March for left shoulder, instructed to return if continuing pain. Let patient know hydrocodone is addictive medications and if he having that much pain he needs to return to ortho for evaluation.

## 2016-06-12 ENCOUNTER — Encounter: Payer: Self-pay | Admitting: Internal Medicine

## 2016-06-12 ENCOUNTER — Ambulatory Visit (INDEPENDENT_AMBULATORY_CARE_PROVIDER_SITE_OTHER): Payer: 59 | Admitting: Internal Medicine

## 2016-06-12 VITALS — BP 130/72 | HR 70 | Temp 98.2°F | Resp 18 | Ht 73.25 in | Wt 224.0 lb

## 2016-06-12 DIAGNOSIS — E559 Vitamin D deficiency, unspecified: Secondary | ICD-10-CM

## 2016-06-12 DIAGNOSIS — Z0000 Encounter for general adult medical examination without abnormal findings: Secondary | ICD-10-CM | POA: Diagnosis not present

## 2016-06-12 DIAGNOSIS — Z125 Encounter for screening for malignant neoplasm of prostate: Secondary | ICD-10-CM

## 2016-06-12 DIAGNOSIS — I10 Essential (primary) hypertension: Secondary | ICD-10-CM | POA: Diagnosis not present

## 2016-06-12 DIAGNOSIS — Z1212 Encounter for screening for malignant neoplasm of rectum: Secondary | ICD-10-CM

## 2016-06-12 DIAGNOSIS — I719 Aortic aneurysm of unspecified site, without rupture: Secondary | ICD-10-CM

## 2016-06-12 DIAGNOSIS — Z23 Encounter for immunization: Secondary | ICD-10-CM | POA: Diagnosis not present

## 2016-06-12 DIAGNOSIS — E782 Mixed hyperlipidemia: Secondary | ICD-10-CM

## 2016-06-12 DIAGNOSIS — R7309 Other abnormal glucose: Secondary | ICD-10-CM

## 2016-06-12 DIAGNOSIS — Z86711 Personal history of pulmonary embolism: Secondary | ICD-10-CM

## 2016-06-12 DIAGNOSIS — R911 Solitary pulmonary nodule: Secondary | ICD-10-CM

## 2016-06-12 DIAGNOSIS — Z0001 Encounter for general adult medical examination with abnormal findings: Secondary | ICD-10-CM

## 2016-06-12 DIAGNOSIS — E349 Endocrine disorder, unspecified: Secondary | ICD-10-CM

## 2016-06-12 DIAGNOSIS — Z79899 Other long term (current) drug therapy: Secondary | ICD-10-CM | POA: Diagnosis not present

## 2016-06-12 DIAGNOSIS — Z130 Encounter for screening for diseases of the blood and blood-forming organs and certain disorders involving the immune mechanism: Secondary | ICD-10-CM

## 2016-06-12 DIAGNOSIS — I7121 Aneurysm of the ascending aorta, without rupture: Secondary | ICD-10-CM

## 2016-06-12 DIAGNOSIS — L409 Psoriasis, unspecified: Secondary | ICD-10-CM

## 2016-06-12 DIAGNOSIS — J449 Chronic obstructive pulmonary disease, unspecified: Secondary | ICD-10-CM

## 2016-06-12 DIAGNOSIS — I2510 Atherosclerotic heart disease of native coronary artery without angina pectoris: Secondary | ICD-10-CM

## 2016-06-12 DIAGNOSIS — Z87891 Personal history of nicotine dependence: Secondary | ICD-10-CM

## 2016-06-12 DIAGNOSIS — K219 Gastro-esophageal reflux disease without esophagitis: Secondary | ICD-10-CM

## 2016-06-12 LAB — CBC WITH DIFFERENTIAL/PLATELET
Basophils Absolute: 108 cells/uL (ref 0–200)
Basophils Relative: 1 %
EOS ABS: 432 {cells}/uL (ref 15–500)
Eosinophils Relative: 4 %
HEMATOCRIT: 39.3 % (ref 38.5–50.0)
HEMOGLOBIN: 13.7 g/dL (ref 13.2–17.1)
LYMPHS ABS: 2592 {cells}/uL (ref 850–3900)
Lymphocytes Relative: 24 %
MCH: 32.5 pg (ref 27.0–33.0)
MCHC: 34.9 g/dL (ref 32.0–36.0)
MCV: 93.3 fL (ref 80.0–100.0)
MONO ABS: 972 {cells}/uL — AB (ref 200–950)
MPV: 9.3 fL (ref 7.5–12.5)
Monocytes Relative: 9 %
NEUTROS PCT: 62 %
Neutro Abs: 6696 cells/uL (ref 1500–7800)
Platelets: 294 10*3/uL (ref 140–400)
RBC: 4.21 MIL/uL (ref 4.20–5.80)
RDW: 12.8 % (ref 11.0–15.0)
WBC: 10.8 10*3/uL (ref 3.8–10.8)

## 2016-06-12 LAB — IRON AND TIBC
%SAT: 26 % (ref 15–60)
IRON: 76 ug/dL (ref 50–180)
TIBC: 287 ug/dL (ref 250–425)
UIBC: 211 ug/dL (ref 125–400)

## 2016-06-12 LAB — HEPATIC FUNCTION PANEL
ALT: 16 U/L (ref 9–46)
AST: 17 U/L (ref 10–35)
Albumin: 4.4 g/dL (ref 3.6–5.1)
Alkaline Phosphatase: 64 U/L (ref 40–115)
BILIRUBIN INDIRECT: 0.5 mg/dL (ref 0.2–1.2)
Bilirubin, Direct: 0.2 mg/dL (ref <=0.2)
TOTAL PROTEIN: 7.3 g/dL (ref 6.1–8.1)
Total Bilirubin: 0.7 mg/dL (ref 0.2–1.2)

## 2016-06-12 LAB — MAGNESIUM: MAGNESIUM: 2.2 mg/dL (ref 1.5–2.5)

## 2016-06-12 LAB — BASIC METABOLIC PANEL WITH GFR
BUN: 28 mg/dL — ABNORMAL HIGH (ref 7–25)
CALCIUM: 9.6 mg/dL (ref 8.6–10.3)
CO2: 22 mmol/L (ref 20–31)
CREATININE: 1.07 mg/dL (ref 0.70–1.33)
Chloride: 101 mmol/L (ref 98–110)
GFR, EST AFRICAN AMERICAN: 89 mL/min (ref >=60)
GFR, EST NON AFRICAN AMERICAN: 77 mL/min (ref >=60)
GLUCOSE: 77 mg/dL (ref 65–99)
Potassium: 4.8 mmol/L (ref 3.5–5.3)
SODIUM: 136 mmol/L (ref 135–146)

## 2016-06-12 LAB — VITAMIN B12: Vitamin B-12: 679 pg/mL (ref 200–1100)

## 2016-06-12 LAB — LIPID PANEL
CHOLESTEROL: 160 mg/dL (ref 125–200)
HDL: 53 mg/dL (ref >=40)
LDL Cholesterol: 80 mg/dL (ref <130)
TRIGLYCERIDES: 134 mg/dL (ref <150)
Total CHOL/HDL Ratio: 3 Ratio (ref <=5.0)
VLDL: 27 mg/dL (ref <30)

## 2016-06-12 LAB — HEMOGLOBIN A1C
Hgb A1c MFr Bld: 5.4 % (ref <5.7)
MEAN PLASMA GLUCOSE: 108 mg/dL

## 2016-06-12 LAB — TSH: TSH: 2.57 m[IU]/L (ref 0.40–4.50)

## 2016-06-12 MED ORDER — LORAZEPAM 2 MG PO TABS: 2.0000 mg | ORAL_TABLET | Freq: Three times a day (TID) | ORAL | Status: DC

## 2016-06-12 NOTE — Patient Instructions (Addendum)
Food Choices for Gastroesophageal Reflux Disease, Adult When you have gastroesophageal reflux disease (GERD), the foods you eat and your eating habits are very important. Choosing the right foods can help ease the discomfort of GERD. WHAT GENERAL GUIDELINES DO I NEED TO FOLLOW?  Choose fruits, vegetables, whole grains, low-fat dairy products, and low-fat meat, fish, and poultry.  Limit fats such as oils, salad dressings, butter, nuts, and avocado.  Keep a food diary to identify foods that cause symptoms.  Avoid foods that cause reflux. These may be different for different people.  Eat frequent small meals instead of three large meals each day.  Eat your meals slowly, in a relaxed setting.  Limit fried foods.  Cook foods using methods other than frying.  Avoid drinking alcohol.  Avoid drinking large amounts of liquids with your meals.  Avoid bending over or lying down until 2-3 hours after eating. WHAT FOODS ARE NOT RECOMMENDED? The following are some foods and drinks that may worsen your symptoms: Vegetables Tomatoes. Tomato juice. Tomato and spaghetti sauce. Chili peppers. Onion and garlic. Horseradish. Fruits Oranges, grapefruit, and lemon (fruit and juice). Meats High-fat meats, fish, and poultry. This includes hot dogs, ribs, ham, sausage, salami, and bacon. Dairy Whole milk and chocolate milk. Sour cream. Cream. Butter. Ice cream. Cream cheese.  Beverages Coffee and tea, with or without caffeine. Carbonated beverages or energy drinks. Condiments Hot sauce. Barbecue sauce.  Sweets/Desserts Chocolate and cocoa. Donuts. Peppermint and spearmint. Fats and Oils High-fat foods, including French fries and potato chips. Other Vinegar. Strong spices, such as black pepper, white pepper, red pepper, cayenne, curry powder, cloves, ginger, and chili powder. The items listed above may not be a complete list of foods and beverages to avoid. Contact your dietitian for more  information.   This information is not intended to replace advice given to you by your health care provider. Make sure you discuss any questions you have with your health care provider.   Document Released: 12/11/2005 Document Revised: 01/01/2015 Document Reviewed: 10/15/2013 Elsevier Interactive Patient Education 2016 Elsevier Inc.  

## 2016-06-12 NOTE — Progress Notes (Signed)
Complete Physical  Assessment and Plan:   1. Essential hypertension  - Urinalysis, Routine w reflex microscopic (not at [facility]) - Microalbumin / creatinine urine ratio - EKG 12-Lead - TSH - [place], RETROPERITNL ABD,  LTD  2. 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 Coronary artery disease involving native coronary artery of native heart without angina pectoris -followed by cards -keep cholesterol at goal <70 -exercise as tolerated  4. Solitary pulmonary nodule -recommend low dose CT scan - DG Chest 2 View; Future  5. Asthma with chronic obstructive pulmonary disease (COPD) (HCC) -cont anoro  6. Gastroesophageal reflux disease, esophagitis presence not specified -2 weeks of nexium -cont 150 mg ranitidine BID  7. Psoriasis -cont meds  8. Mixed hyperlipidemia -cont diet and exercise -cont meds - Lipid panel  9. Aortic root aneurysm (HCC) -followed by cards  10. Abnormal glucose  - Hemoglobin A1c - Insulin, random  11. Vitamin D deficiency -cont Vit D supplement - VITAMIN D 25 Hydroxy (Vit-D Deficiency, Fractures)  12. Medication management -cont bi yearly labs  13. Encounter for general adult medical examination with abnormal findings  - CBC with Differential/Platelet - BASIC METABOLIC PANEL WITH GFR - Hepatic function panel - Magnesium  14. Screening for prostate cancer  - PSA  15. Screening for deficiency anemia  - Iron and TIBC - Vitamin B12  16. Testosterone deficiency  - Testosterone  17. Need for prophylactic vaccination with combined diphtheria-tetanus-pertussis (DTP) vaccine  - Tdap vaccine greater than or equal to 7yo IM    Discussed med's effects and SE's. Screening labs and tests as requested with regular follow-up as recommended.  HPI Patient presents for a complete physical.   His blood pressure has been controlled at home, today their BP is BP: 130/72  mmHg He does workout. He denies chest pain, shortness of breath, dizziness. He reports that he is doing some weight training and also does some running on the treadmill 2-3 times per week for 20 minutes.  He also uses his speed bag and punching.     He is on cholesterol medication and denies myalgias. His cholesterol is not at goal. The cholesterol last visit was:   Lab Results  Component Value Date   CHOL 217* 03/28/2016   HDL 61 03/28/2016   LDLCALC 112 03/28/2016   TRIG 221* 03/28/2016   CHOLHDL 3.6 03/28/2016    He has been working on diet and exercise for prediabetes, he is on bASA, he is not on ACE/ARB and denies foot ulcerations, hyperglycemia, hypoglycemia , increased appetite, nausea, paresthesia of the feet, polydipsia, polyuria, visual disturbances, vomiting and weight loss. Last A1C in the office was:  Lab Results  Component Value Date   HGBA1C 5.2 12/13/2015    Patient is on Vitamin D supplement.   Lab Results  Component Value Date   VD25OH 64 03/28/2016      Last PSA was: Lab Results  Component Value Date   PSA 0.67 06/09/2015  .  Denies BPH symptoms daytime frequency, double voiding, dysuria, hematuria, hesitancy, incontinence, intermittency, nocturia, sensation of incomplete bladder emptying, suprapubic pain, urgency or weak urinary stream.  He does have a history of COPD. He reports that he has been doing well with his breathing.  He reports hot air and humidity are a problem for him.  He quit smoking almost 6 years ago.  He smoked 2 packs per day at his heaviest.  He has  70 pack years of smoking.  He has never had a low dose CT scan.  He is doing well with anoro on board.  He does not an increase in acid reflux recently.  He does take ranitidine OTC 150 mg BID.  He does not take anything else for it.  He does eat large meals infrequently through the day.    Current Medications:  Current Outpatient Prescriptions on File Prior to Visit  Medication Sig Dispense  Refill  . amLODipine-olmesartan (AZOR) 5-40 MG tablet TAKE 1 TABLET BY MOUTH EVERY DAY 90 tablet 0  . ANORO ELLIPTA 62.5-25 MCG/INH AEPB     . atorvastatin (LIPITOR) 40 MG tablet TAKE 1 TABLET BY MOUTH EVERY DAY 90 tablet 1  . beclomethasone (QVAR) 80 MCG/ACT inhaler 2 inhalations daily 1 Inhaler 12  . clobetasol ointment (TEMOVATE) 0.05 % Apply as needed    . Flaxseed, Linseed, (EQL FLAX SEED OIL) 1000 MG CAPS Take 1,000 mg by mouth daily.    Marland Kitchen HUMIRA PEN 40 MG/0.8ML PNKT     . levothyroxine (SYNTHROID, LEVOTHROID) 100 MCG tablet TAKE 1 TABLET (100 MCG TOTAL) BY MOUTH DAILY. 90 tablet 1  . LORazepam (ATIVAN) 2 MG tablet TAKE 1 TABLET BY MOUTH 3 TIMES A DAY 90 tablet 0  . metoprolol tartrate (LOPRESSOR) 25 MG tablet TAKE 1 TABLET (25 MG TOTAL) BY MOUTH 2 (TWO) TIMES DAILY. 180 tablet 2  . Multiple Vitamins-Minerals (MULTIVITAMIN WITH MINERALS) tablet Take 1 tablet by mouth daily.    . Omega-3 Fatty Acids (FISH OIL) 1000 MG CAPS Take 1,000 mg by mouth daily.    Marland Kitchen PROAIR HFA 108 (90 BASE) MCG/ACT inhaler INHALE 2 PUFFS INTO THE LUNGS EVERY 4 HOURS AS NEEDED FOR WHEEZING OR SHORTNESS OF BREATH. 18 g 3   No current facility-administered medications on file prior to visit.    Health Maintenance:  Immunization History  Administered Date(s) Administered  . Influenza Split 11/15/2012  . Influenza Whole 09/24/2010  . Pneumococcal Polysaccharide-23 06/03/2010    Tetanus: Today Pneumovax:2011 Prevnar 13:Not due yet Flu vaccine:2016 at work Zostavax:Declined today, will call insurance Colonoscopy: 2012 Eye Exam: Dr. Luciana Axe  Patient Care Team: Lucky Cowboy, MD as PCP - General (Internal Medicine) Kalman Shan, MD as Consulting Physician (Pulmonary Disease) Elmon Else, MD as Consulting Physician (Dermatology) Edmon Crape, MD as Consulting Physician (Ophthalmology) Wendall Stade, MD as Consulting Physician (Cardiology) Sharrell Ku, MD as Consulting Physician  (Gastroenterology) Waymon Budge, MD as Consulting Physician (Pulmonary Disease)  Allergies:  Allergies  Allergen Reactions  . Ace Inhibitors     cough  . Haldol [Haloperidol Lactate]     psychosis  . Haloperidol Lactate     REACTION: phsycotic break    Medical History:  Past Medical History  Diagnosis Date  . Asthma     Present all life. Dx as child. Been in [facility] since 2003--pft 06/03/10 FEV1 3.14/ 82%, FEV1/FVC 0.54; insig resp to BD, DLCO 75%, ni lUNG  Vol  . Hyperlipidemia   . Hypertension     on lisinopril 2002/2003--changed ARB march 2011  . Palpitations     started atenolol since 2003. recollects hx of holter. 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 informed it "Not a dangerous condition"-changed to lopressor 12/15/2010  . Pulmonary embolism (HCC)     does not recollect tpA or being on lot of o2 or hypotension. 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 Psoriasis  on enbre;l since 005. started in [place]. Stopped 2011 by [place] derm due to nodule on chest  . Detached retina   . Lung nodule     5mm on ct 11/10/10- likely lymph node  . History of TB skin testing     negative 2005-2010    Surgical History:  Past Surgical History  Procedure Laterality Date  . Retinal detachment surgery    . Video bronchoscopy  07/26/2012    Procedure: VIDEO BRONCHOSCOPY WITH FLUORO;  Surgeon: Alyson ReedyWesam G Yacoub, MD;  [facility];  Service: Cardiopulmonary;  Laterality: Bilateral;    Family History:  Family History  Problem Relation Age of Onset  . Emphysema Father   . Asthma Mother   . Allergies Mother     Social History:   Social History  Substance Use Topics  . Smoking status: Former Smoker -- 1.00 packs/day for 35 years    Quit date: 03/25/2010  . Smokeless tobacco: Never Used  . Alcohol Use: 7.2 oz/week    12 Standard drinks or equivalent per week     Comment: every other day, 4-5drinks    Review of Systems:  Review of  Systems  Constitutional: Negative for fever, chills and malaise/fatigue.  HENT: Negative for congestion, ear pain and sore throat.   Eyes: Negative.   Respiratory: Positive for cough and shortness of breath. Negative for sputum production and wheezing.   Cardiovascular: Negative for chest pain, palpitations and leg swelling.  Gastrointestinal: Positive for heartburn. Negative for abdominal pain, diarrhea, constipation, blood in stool and melena.  Genitourinary: Negative.   Skin: Negative.   Neurological: Negative for dizziness, sensory change, loss of consciousness and headaches.  Psychiatric/Behavioral: Negative for depression. The patient is not nervous/anxious and does not have insomnia.     Physical Exam: Estimated body mass index is 29.34 kg/(m^2) as calculated from the following:   Height as of this encounter: 6' 1.25" (1.861 m).   Weight as of this encounter: 224 lb (101.606 kg). BP 130/72 mmHg  Temp(Src) 98.2 F (36.8 C) (Temporal)  Resp 18  Ht 6' 1.25" (1.861 m)  Wt 224 lb (101.606 kg)  BMI 29.34 kg/m2  General Appearance: Well nourished, in no apparent distress.  Eyes: PERRLA, EOMs, conjunctiva no swelling or erythema ENT/Mouth: Ear canals clear bilaterally with no erythema, swelling, discharge.  TMs normal bilaterally with no erythema, bulging, or retractions.  Oropharynx clear and moist with no exudate, swelling, or erythema.  Dentition normal.   Neck: Supple, thyroid normal. No bruits, JVD, cervical adenopathy Respiratory: Respiratory effort normal, BS equal bilaterally without rales, rhonchi, wheezing or stridor.  Cardio: RRR without murmurs, rubs or gallops. Brisk peripheral pulses without edema.  Chest: symmetric, with normal excursions Abdomen: Soft, nontender, no guarding, rebound, hernias, masses, or organomegaly. Genitourinary:  Musculoskeletal: Full ROM all peripheral extremities,5/5 strength, and normal gait.  Skin: Warm, dry without rashes, lesions,  ecchymosis. Neuro: A&Ox3, Cranial nerves intact, reflexes equal bilaterally. Normal muscle tone, no cerebellar symptoms. Sensation intact.  Psych: Normal affect, Insight and Judgment appropriate.   EKG: WNL no changes.  AORTA SCAN: WNL  Over 40 minutes of exam, counseling, chart review and critical decision making was performed  Toni Amendourtney Forcucci 2:13 PM [facility]

## 2016-06-13 LAB — MICROALBUMIN / CREATININE URINE RATIO
Creatinine, Urine: 78 mg/dL (ref 20–370)
MICROALB/CREAT RATIO: 10 ug/mg{creat} (ref <30)
Microalb, Ur: 0.8 mg/dL

## 2016-06-13 LAB — URINALYSIS, ROUTINE W REFLEX MICROSCOPIC
BILIRUBIN URINE: NEGATIVE
GLUCOSE, UA: NEGATIVE
Hgb urine dipstick: NEGATIVE
Ketones, ur: NEGATIVE
LEUKOCYTES UA: NEGATIVE
Nitrite: NEGATIVE
PH: 5.5 (ref 5.0–8.0)
Protein, ur: NEGATIVE
SPECIFIC GRAVITY, URINE: 1.012 (ref 1.001–1.035)

## 2016-06-13 LAB — VITAMIN D 25 HYDROXY (VIT D DEFICIENCY, FRACTURES): VIT D 25 HYDROXY: 97 ng/mL (ref 30–100)

## 2016-06-13 LAB — PSA: PSA: 0.88 ng/mL (ref <=4.00)

## 2016-06-13 LAB — INSULIN, RANDOM: INSULIN: 5.5 u[IU]/mL (ref 2.0–19.6)

## 2016-06-13 LAB — TESTOSTERONE: TESTOSTERONE: 315 ng/dL (ref 250–827)

## 2016-06-15 ENCOUNTER — Encounter: Payer: Self-pay | Admitting: Internal Medicine

## 2016-06-19 ENCOUNTER — Other Ambulatory Visit: Payer: Self-pay | Admitting: Internal Medicine

## 2016-06-28 ENCOUNTER — Encounter: Payer: Self-pay | Admitting: Internal Medicine

## 2016-07-05 ENCOUNTER — Other Ambulatory Visit: Payer: Self-pay | Admitting: Internal Medicine

## 2016-07-06 ENCOUNTER — Other Ambulatory Visit: Payer: Self-pay | Admitting: Internal Medicine

## 2016-07-06 DIAGNOSIS — Z87891 Personal history of nicotine dependence: Secondary | ICD-10-CM

## 2016-07-26 ENCOUNTER — Telehealth: Payer: Self-pay | Admitting: Acute Care

## 2016-07-26 NOTE — Telephone Encounter (Signed)
Called spoke with Katrina. She states that she will clarify with the PA if the referral is for the lung cancer screening program or not. Will await call back.

## 2016-07-27 NOTE — Telephone Encounter (Signed)
Received staff message   ----- Message ----- From: Tama Headings Sent: 07/27/2016 10:20 AM To: Cleon Dew Rilla Buckman, CMA Subject: Referral to                   Lesleigh Noe,   Per Terri Piedra, PA, she would like to refer Dasan to the Lung Nodule Clinic. I took Kandice Robinsons out of the provider name. Please advise if there is a different process for this clinic.  Thank you, Theodoro Kalata Referral Coordinator  [facility] [phone] ext. [phone] Fax [phone]  I Replied back within referral stating We do not have a specific lung nodule clinic. If she is wanting her to be seen by one of our pulmonologist for a lung nodule then she will need just a referral to pulmonary for lung nodules since she is not interested in the lung cancer screening program. Thanks.    Nothing further needed at this time.

## 2016-08-01 ENCOUNTER — Other Ambulatory Visit: Payer: Self-pay | Admitting: *Deleted

## 2016-08-01 MED ORDER — METOPROLOL TARTRATE 25 MG PO TABS: ORAL_TABLET | ORAL | 0 refills | Status: DC

## 2016-08-01 MED ORDER — AMLODIPINE-OLMESARTAN 5-40 MG PO TABS: 1.0000 | ORAL_TABLET | Freq: Every day | ORAL | 0 refills | Status: DC

## 2016-08-07 ENCOUNTER — Other Ambulatory Visit: Payer: Self-pay | Admitting: *Deleted

## 2016-08-07 MED ORDER — METOPROLOL TARTRATE 25 MG PO TABS: ORAL_TABLET | ORAL | 2 refills | Status: DC

## 2016-08-08 ENCOUNTER — Telehealth: Payer: Self-pay | Admitting: *Deleted

## 2016-08-08 NOTE — Telephone Encounter (Signed)
[facility] called to inform us that Lorazepam 2 mg is on back order and is predicted to be unavailable until the end of Septmenber.  They are unable to refill patient's Rx unless changed to 1 mg.  Patient will not agree to 1 mg TID per Terri Piedraourtney Forcucci, PA-C recommendations.  Patient states he will just get his Rx filled for the 2 mg TID #90 at another pharmacy.  Will not agree to lowering dose per her suggestions.  We will agree to fill 1 Lorazepam 1 mg 2 pill TID to keep his Rx the same at this time, but reducing patient's dose will be addressed and discussed in detail at his next OV per Terri Piedraourtney Forcucci, PA-C orders. Pharmacy aware that this is a one time fill with no additional refills authorized at this time.

## 2016-08-11 ENCOUNTER — Telehealth: Payer: Self-pay | Admitting: Acute Care

## 2016-08-11 NOTE — Telephone Encounter (Signed)
Will route to lung cancer screening  

## 2016-08-17 NOTE — Telephone Encounter (Signed)
lmtcb x1 

## 2016-08-24 ENCOUNTER — Telehealth: Payer: Self-pay | Admitting: Acute Care

## 2016-08-24 NOTE — Telephone Encounter (Signed)
Called spoke with pt. Pt states he currently does not have insurance and request that the referral be canceled at this time. He states he will contact Terri Piedraourtney Forcucci, PA's office once he reestablishes insurance. [facility]. Per medical records form can not be scanned into chart. Nothing further needed.

## 2016-08-31 ENCOUNTER — Other Ambulatory Visit: Payer: Self-pay | Admitting: Internal Medicine

## 2016-09-06 ENCOUNTER — Other Ambulatory Visit: Payer: Self-pay | Admitting: *Deleted

## 2016-09-06 MED ORDER — LORAZEPAM 2 MG PO TABS: 2.0000 mg | ORAL_TABLET | Freq: Three times a day (TID) | ORAL | 1 refills | Status: DC

## 2016-10-31 ENCOUNTER — Other Ambulatory Visit: Payer: Self-pay | Admitting: Internal Medicine

## 2016-11-01 ENCOUNTER — Other Ambulatory Visit: Payer: Self-pay | Admitting: Internal Medicine

## 2016-11-01 MED ORDER — LORAZEPAM 1 MG PO TABS: ORAL_TABLET | ORAL | 0 refills | Status: DC

## 2016-11-02 ENCOUNTER — Other Ambulatory Visit: Payer: Self-pay | Admitting: Internal Medicine

## 2016-11-02 ENCOUNTER — Ambulatory Visit: Payer: Self-pay | Admitting: Internal Medicine

## 2016-11-22 ENCOUNTER — Other Ambulatory Visit: Payer: Self-pay | Admitting: Internal Medicine

## 2016-11-27 ENCOUNTER — Encounter: Payer: Self-pay | Admitting: Physician Assistant

## 2016-11-27 ENCOUNTER — Ambulatory Visit (INDEPENDENT_AMBULATORY_CARE_PROVIDER_SITE_OTHER): Payer: Self-pay | Admitting: Physician Assistant

## 2016-11-27 ENCOUNTER — Telehealth: Payer: Self-pay

## 2016-11-27 VITALS — BP 120/80 | HR 82 | Temp 98.2°F | Resp 16 | Ht 73.25 in | Wt 232.0 lb

## 2016-11-27 DIAGNOSIS — J449 Chronic obstructive pulmonary disease, unspecified: Secondary | ICD-10-CM

## 2016-11-27 DIAGNOSIS — I10 Essential (primary) hypertension: Secondary | ICD-10-CM

## 2016-11-27 DIAGNOSIS — I2510 Atherosclerotic heart disease of native coronary artery without angina pectoris: Secondary | ICD-10-CM

## 2016-11-27 DIAGNOSIS — E039 Hypothyroidism, unspecified: Secondary | ICD-10-CM

## 2016-11-27 DIAGNOSIS — E559 Vitamin D deficiency, unspecified: Secondary | ICD-10-CM

## 2016-11-27 DIAGNOSIS — F411 Generalized anxiety disorder: Secondary | ICD-10-CM | POA: Insufficient documentation

## 2016-11-27 DIAGNOSIS — J4489 Other specified chronic obstructive pulmonary disease: Secondary | ICD-10-CM

## 2016-11-27 DIAGNOSIS — Z79899 Other long term (current) drug therapy: Secondary | ICD-10-CM

## 2016-11-27 DIAGNOSIS — E782 Mixed hyperlipidemia: Secondary | ICD-10-CM

## 2016-11-27 MED ORDER — TRIAMCINOLONE ACETONIDE 0.1 % EX OINT: 1.0000 "application " | TOPICAL_OINTMENT | Freq: Two times a day (BID) | CUTANEOUS | 3 refills | Status: DC

## 2016-11-27 MED ORDER — LORAZEPAM 1 MG PO TABS: ORAL_TABLET | ORAL | 1 refills | Status: DC

## 2016-11-27 NOTE — Progress Notes (Signed)
Patient ID: JADIS MIKA, male   DOB: [birth year], 55 y.o.   MRN: [id number]  Assessment and Plan:  Hypertension:  -Continue medication, azor samples given -monitor blood pressure at home.  -Continue DASH diet.   -Reminder to go to the ER if any CP, SOB, nausea, dizziness, severe HA, changes vision/speech, left arm numbness and tingling, and jaw pain.  Cholesterol: -Continue diet and exercise.  -Check cholesterol NEXT OV  Hypothyroidism -check TSH level, continue medications the same, reminded to take on an empty stomach 30-104mins before food.   Obesity with co morbidities - long discussion about weight loss, diet, and exercise  Vitamin D Def: -check level -continue medications.   COPD -currently well controlled  -anoro and arnuity samples given  Anxiety Ativan 1mg  TID refilled, will continue to try to decrease after the holidays   Continue diet and meds as discussed. Further disposition pending results of labs. Future Appointments Date Time Provider Department Center  06/12/2017 2:00 PM Courtney Forcucci, PA-C [facility] None    HPI 55 y.o. male  presents for 3 month follow up with hypertension, hyperlipidemia, prediabetes and vitamin D.   His blood pressure has been controlled at home, today their BP is BP: 120/80.   He does workout. He denies chest pain, shortness of breath, dizziness. Left to [place] but came back recently and restarted his job, lost insurance due to this but regains in 2 weeks.    He states that he was backed down from 2mg  to 1 mg TID, he is interested in slowly backing off after the holidays and continuing this, not interested in SSRI/SNRI at this time but may in the future if unable to take the ativan PRN which is his goal.   He has asthma/COPD, he is on anoro and Qvar, will use albuterol 2 x daily with allergies.   He is on thyroid medication. His medication was changed last visit however .   Lab Results  Component Value Date   TSH 2.57  06/12/2016    He is on cholesterol medication and denies myalgias. His cholesterol is at goal. The cholesterol last visit was:   Lab Results  Component Value Date   CHOL 160 06/12/2016   HDL 53 06/12/2016   LDLCALC 80 06/12/2016   TRIG 134 06/12/2016   CHOLHDL 3.0 06/12/2016    He has been working on diet and exercise for prediabetes, and denies foot ulcerations, hyperglycemia, hypoglycemia , increased appetite, nausea, paresthesia of the feet, polydipsia, polyuria, visual disturbances, vomiting and weight loss. Last A1C in the office was:  Lab Results  Component Value Date   HGBA1C 5.4 06/12/2016   Patient is on Vitamin D supplement.  Lab Results  Component Value Date   VD25OH 97 06/12/2016     BMI is Body mass index is 30.4 kg/m., he is working on diet and exercise. Wt Readings from Last 3 Encounters:  11/27/16 232 lb (105.2 kg)  06/12/16 224 lb (101.6 kg)  03/28/16 226 lb (102.5 kg)   Current Medications:  Current Outpatient Prescriptions on File Prior to Visit  Medication Sig Dispense Refill  . amLODipine-olmesartan (AZOR) 5-40 MG tablet TAKE ONE TABLET BY MOUTH ONCE DAILY 30 tablet 3  . ANORO ELLIPTA 62.5-25 MCG/INH AEPB TAKE 1 PUFF BY MOUTH EVERY DAY 60 each 11  . atorvastatin (LIPITOR) 40 MG tablet TAKE 1 TABLET BY MOUTH EVERY DAY 90 tablet 1  . beclomethasone (QVAR) 80 MCG/ACT inhaler 2 inhalations daily 1 Inhaler 12  . clobetasol ointment (  TEMOVATE) 0.05 % Apply as needed    . Flaxseed, Linseed, (EQL FLAX SEED OIL) 1000 MG CAPS Take 1,000 mg by mouth daily.    Marland Kitchen. HUMIRA PEN 40 MG/0.8ML PNKT     . levothyroxine (SYNTHROID, LEVOTHROID) 100 MCG tablet TAKE 1 TABLET (100 MCG TOTAL) BY MOUTH DAILY. 90 tablet 1  . LORazepam (ATIVAN) 1 MG tablet Take 1/2 to 1 tablet 3 x/day ONLY if needed for severe anxiety 90 tablet 0  . metoprolol tartrate (LOPRESSOR) 25 MG tablet TAKE ONE TABLET BY MOUTH TWICE DAILY 60 tablet 2  . Multiple Vitamins-Minerals (MULTIVITAMIN WITH MINERALS)  tablet Take 1 tablet by mouth daily.    . Omega-3 Fatty Acids (FISH OIL) 1000 MG CAPS Take 1,000 mg by mouth daily.    Marland Kitchen. PROAIR HFA 108 (90 BASE) MCG/ACT inhaler INHALE 2 PUFFS INTO THE LUNGS EVERY 4 HOURS AS NEEDED FOR WHEEZING OR SHORTNESS OF BREATH. 18 g 3   No current facility-administered medications on file prior to visit.     Medical History:  Past Medical History:  Diagnosis Date  . Asthma    Present all life. Dx as child. Been in [facility] since 2003--pft 06/03/10 FEV1 3.14/ 82%, FEV1/FVC 0.54; insig resp to BD, DLCO 75%, ni lUNG  Vol  . Detached retina   . History of TB skin testing    negative 2005-2010  . Hyperlipidemia   . Hypertension    on lisinopril 2002/2003--changed ARB march 2011  . Lung nodule    5mm on ct 11/10/10- likely lymph node  . Palpitations    started atenolol since 2003. recollects hx of holter. 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 informed it "Not a dangerous condition"-changed to lopressor 12/15/2010  . Psoriasis    on enbre;l since 005. started in [place]. Stopped 2011 by [place] derm due to nodule on chest  . Pulmonary embolism (HCC)    does not recollect tpA or being on lot of o2 or hypotension. 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 Ace Inhibitors     cough  . Haldol [Haloperidol Lactate]     psychosis  . Haloperidol Lactate     REACTION: phsycotic break     Review of Systems:  Review of Systems  Constitutional: Negative for chills, diaphoresis and fever.  HENT: Negative for congestion, ear pain and sore throat.   Eyes: Negative for blurred vision and double vision.  Respiratory: Negative for cough, shortness of breath and wheezing.   Cardiovascular: Negative for chest pain, palpitations and leg swelling.  Gastrointestinal: Positive for heartburn. Negative for blood in stool, constipation, diarrhea and melena.  Genitourinary: Negative.   Neurological:  Negative for headaches.    Family history- Review and unchanged  Social history- Review and unchanged  Physical Exam: BP 120/80   Pulse 82   Temp 98.2 F (36.8 C)   Resp 16   Ht 6' 1.25" (1.861 m)   Wt 232 lb (105.2 kg)   SpO2 95%   BMI 30.40 kg/m  Wt Readings from Last 3 Encounters:  11/27/16 232 lb (105.2 kg)  06/12/16 224 lb (101.6 kg)  03/28/16 226 lb (102.5 kg)    General Appearance: Well nourished well developed, in no apparent distress. Eyes: PERRLA, EOMs, conjunctiva no swelling or erythema ENT/Mouth: Ear canals normal without obstruction, swelling, erythma, discharge.  TMs normal bilaterally.  Oropharynx moist, clear, without exudate, or postoropharyngeal  swelling. Neck: Supple, thyroid normal,no cervical adenopathy  Respiratory: Respiratory effort normal, Breath sounds clear A&P without rhonchi, wheeze, or rale.  No retractions, no accessory usage. Cardio: RRR with no MRGs. Brisk peripheral pulses without edema.  Abdomen: Soft, + BS,  Non tender, no guarding, rebound, hernias, masses. Musculoskeletal: Full ROM, 5/5 strength, Normal gait Skin: Warm, dry without rashes, lesions, ecchymosis.  Neuro: Awake and oriented X 3, Cranial nerves intact. Normal muscle tone, no cerebellar symptoms. Psych: Normal affect, Insight and Judgment appropriate.    Quentin MullingAmanda Kadey Mihalic, PA-C 4:16 PM [facility],

## 2016-11-27 NOTE — Patient Instructions (Signed)
Please call and get lab only for when you get insurance in 1-2 months.   Will decrease ativan to the 1 mg a day with goal to take AS needed in the future If you want to try a once a day medication let us know

## 2016-11-27 NOTE — Telephone Encounter (Signed)
Ativan was called into pharmacy.

## 2016-12-12 ENCOUNTER — Ambulatory Visit: Payer: Self-pay | Admitting: Physician Assistant

## 2016-12-26 ENCOUNTER — Other Ambulatory Visit: Payer: Self-pay | Admitting: Physician Assistant

## 2016-12-29 ENCOUNTER — Other Ambulatory Visit: Payer: Self-pay | Admitting: Internal Medicine

## 2017-01-16 ENCOUNTER — Other Ambulatory Visit: Payer: Self-pay | Admitting: Physician Assistant

## 2017-01-22 ENCOUNTER — Other Ambulatory Visit: Payer: Self-pay | Admitting: Physician Assistant

## 2017-01-22 DIAGNOSIS — F411 Generalized anxiety disorder: Secondary | ICD-10-CM

## 2017-01-22 NOTE — Telephone Encounter (Signed)
Ativan was called into pharmacy @ 1:52pm

## 2017-01-29 ENCOUNTER — Other Ambulatory Visit: Payer: Self-pay | Admitting: Physician Assistant

## 2017-01-29 DIAGNOSIS — M25569 Pain in unspecified knee: Secondary | ICD-10-CM

## 2017-01-29 NOTE — Progress Notes (Signed)
Patient complains of knee pain/swelling, would like to see specialist.

## 2017-01-31 ENCOUNTER — Ambulatory Visit (INDEPENDENT_AMBULATORY_CARE_PROVIDER_SITE_OTHER): Payer: PRIVATE HEALTH INSURANCE | Admitting: Orthopaedic Surgery

## 2017-01-31 ENCOUNTER — Encounter (INDEPENDENT_AMBULATORY_CARE_PROVIDER_SITE_OTHER): Payer: Self-pay | Admitting: Orthopaedic Surgery

## 2017-01-31 ENCOUNTER — Ambulatory Visit (INDEPENDENT_AMBULATORY_CARE_PROVIDER_SITE_OTHER): Payer: PRIVATE HEALTH INSURANCE

## 2017-01-31 DIAGNOSIS — M25561 Pain in right knee: Secondary | ICD-10-CM

## 2017-01-31 DIAGNOSIS — G8929 Other chronic pain: Secondary | ICD-10-CM | POA: Diagnosis not present

## 2017-01-31 MED ORDER — PREDNISONE 10 MG (21) PO TBPK: ORAL_TABLET | ORAL | 0 refills | Status: DC

## 2017-01-31 NOTE — Progress Notes (Signed)
Office Visit Note   Patient: Samuel Sharp           Date of Birth: 01/16/1961           MRN: [id number] Visit Date: 01/31/2017              Requested by: Samuel MullingAmanda Collier, PA-C [address] [place], [place] [id number] PCP: Samuel CorwinMCKEOWN,WILLIAM DAVID, MD   Assessment & Plan: Visit Diagnoses:  1. Chronic pain of right knee     Plan: Due to having numerous psoriasis plaques and risk of infection, I advised against joint aspiration of his effusion today. He will start prednisone and return in 4 weeks. If his effusion is still present and psoriasis has cleared, we will aspirate and inject his knee at that time.   Follow-Up Instructions: Return in about 4 weeks (around 02/28/2017).   Orders:  Orders Placed This Encounter  Procedures  . XR KNEE 3 VIEW RIGHT   Meds ordered this encounter  Medications  . predniSONE (STERAPRED UNI-PAK 21 TAB) 10 MG (21) TBPK tablet    Sig: Take as directed    Dispense:  21 tablet    Refill:  0      Procedures: No procedures performed   Clinical Data: No additional findings.   Subjective: Chief Complaint  Patient presents with  . Right Knee - Pain    Samuel Sharp is a 56 yo male who presents with a chief complaint of right knee pain for the past 2-3 months. His pain has been significantly worse in the past two weeks. He states that he is unable to bend his knee due to it feeling tight. He has some numbness and swelling. He denies any injury, tingling, catching, locking, or popping. Pain is worse with walking and movement. It's better with rest. Aleve, ice, and elevation did not help. Wearing a sleeve normally helps except in these past two weeks. Patient does have psoriasis for which he just restarted Humira last week. He denies other joint pain aside from shoulder pain.     Review of Systems  Skin: Positive for rash.  All other systems reviewed and are negative.    Objective: Vital Signs: There were no vitals taken for this  visit.  Physical Exam  Constitutional: He is oriented to person, place, and time. He appears well-developed and well-nourished.  HENT:  Head: Normocephalic and atraumatic.  Eyes: Pupils are equal, round, and reactive to light.  Neck: Neck supple.  Pulmonary/Chest: Effort normal.  Abdominal: Soft.  Musculoskeletal: Normal range of motion.  Neurological: He is alert and oriented to person, place, and time.  Skin: Skin is warm. Rash noted.  Psychiatric: He has a normal mood and affect. His behavior is normal. Judgment and thought content normal.  Nursing note and vitals reviewed. Extensive psoriasis plaques on bilateral lower extremities.   Right Knee Exam   Range of Motion  Extension: normal  Flexion: 90   Muscle Strength   The patient has normal right knee strength.  Tests  McMurray:  Medial - negative Lateral - negative Drawer:       Anterior - negative     Varus: negative Valgus: negative  Comments:  Right knee flexion limited to 90* due to pain.   Left Knee Exam  Left knee exam is normal.      Specialty Comments:  No specialty comments available.  Imaging: No results found.   PMFS History: Patient Active Problem List   Diagnosis Date Noted  .  Hypothyroidism 11/27/2016  . Generalized anxiety disorder 11/27/2016  . Abnormal glucose 06/09/2015  . Vitamin D deficiency 06/09/2015  . Medication management 06/09/2015  . Aortic root aneurysm (HCC) 09/15/2014  . CAD (coronary artery disease) 09/02/2013  . Asthma with chronic obstructive pulmonary disease (COPD) (HCC) 05/29/2011  . Psoriasis 05/29/2011  . Solitary pulmonary nodule 12/15/2010  . GERD 06/03/2010  . Mixed hyperlipidemia 05/02/2010  . Essential hypertension 05/02/2010  . History of pulmonary embolus (PE) 05/02/2010   Past Medical History:  Diagnosis Date  . Asthma    Present all life. Dx as child. Been in [facility] since 2003--pft 06/03/10 FEV1 3.14/ 82%, FEV1/FVC 0.54; insig resp to BD, DLCO 75%,  ni lUNG  Vol  . Detached retina   . History of TB skin testing    negative 2005-2010  . Hyperlipidemia   . Hypertension    on lisinopril 2002/2003--changed ARB march 2011  . Lung nodule    5mm on ct 11/10/10- likely lymph node  . Palpitations    started atenolol since 2003. recollects hx of holter. 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 informed it "Not a dangerous condition"-changed to lopressor 12/15/2010  . Psoriasis    on enbre;l since 005. started in [place]. Stopped 2011 by [place] derm due to nodule on chest  . Pulmonary embolism (HCC)    does not recollect tpA or being on lot of o2 or hypotension. 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 Emphysema Father   . Asthma Mother   . Allergies Mother     Past Surgical History:  Procedure Laterality Date  . RETINAL DETACHMENT SURGERY    . VIDEO BRONCHOSCOPY  07/26/2012   Procedure: VIDEO BRONCHOSCOPY WITH FLUORO;  Surgeon: Alyson Reedy, MD;  Location: [facility] ENDOSCOPY;  Service: Cardiopulmonary;  Laterality: Bilateral;   Social History   Occupational History  . Not on file.   Social History Main Topics  . Smoking status: Former Smoker    Packs/day: 2.00    Years: 35.00    Types: Cigarettes    Quit date: 03/25/2010  . Smokeless tobacco: Never Used  . Alcohol use 7.2 oz/week    12 Standard drinks or equivalent per week     Comment: every other day, 4-5drinks  . Drug use: No  . Sexual activity: No

## 2017-02-13 ENCOUNTER — Other Ambulatory Visit: Payer: Self-pay | Admitting: Internal Medicine

## 2017-02-27 ENCOUNTER — Ambulatory Visit: Payer: Self-pay | Admitting: Physician Assistant

## 2017-02-27 ENCOUNTER — Ambulatory Visit (INDEPENDENT_AMBULATORY_CARE_PROVIDER_SITE_OTHER): Payer: 59 | Admitting: Physician Assistant

## 2017-02-27 ENCOUNTER — Encounter: Payer: Self-pay | Admitting: Physician Assistant

## 2017-02-27 VITALS — BP 138/76 | HR 83 | Temp 97.3°F | Resp 16 | Ht 73.25 in | Wt 238.0 lb

## 2017-02-27 DIAGNOSIS — E039 Hypothyroidism, unspecified: Secondary | ICD-10-CM | POA: Diagnosis not present

## 2017-02-27 DIAGNOSIS — E782 Mixed hyperlipidemia: Secondary | ICD-10-CM

## 2017-02-27 DIAGNOSIS — I719 Aortic aneurysm of unspecified site, without rupture: Secondary | ICD-10-CM

## 2017-02-27 DIAGNOSIS — J449 Chronic obstructive pulmonary disease, unspecified: Secondary | ICD-10-CM

## 2017-02-27 DIAGNOSIS — I10 Essential (primary) hypertension: Secondary | ICD-10-CM

## 2017-02-27 DIAGNOSIS — R7309 Other abnormal glucose: Secondary | ICD-10-CM

## 2017-02-27 DIAGNOSIS — Z79899 Other long term (current) drug therapy: Secondary | ICD-10-CM | POA: Diagnosis not present

## 2017-02-27 DIAGNOSIS — I7121 Aneurysm of the ascending aorta, without rupture: Secondary | ICD-10-CM

## 2017-02-27 LAB — LIPID PANEL
CHOL/HDL RATIO: 4.2 ratio (ref <5.0)
CHOLESTEROL: 183 mg/dL (ref <200)
HDL: 44 mg/dL (ref >40)
LDL Cholesterol: 80 mg/dL (ref <100)
TRIGLYCERIDES: 296 mg/dL — AB (ref <150)
VLDL: 59 mg/dL — ABNORMAL HIGH (ref <30)

## 2017-02-27 LAB — BASIC METABOLIC PANEL WITH GFR
BUN: 25 mg/dL (ref 7–25)
CHLORIDE: 104 mmol/L (ref 98–110)
CO2: 22 mmol/L (ref 20–31)
CREATININE: 1.06 mg/dL (ref 0.70–1.33)
Calcium: 9.6 mg/dL (ref 8.6–10.3)
GFR, Est Non African American: 78 mL/min (ref >=60)
Glucose, Bld: 85 mg/dL (ref 65–99)
Potassium: 4.5 mmol/L (ref 3.5–5.3)
SODIUM: 137 mmol/L (ref 135–146)

## 2017-02-27 LAB — CBC WITH DIFFERENTIAL/PLATELET
BASOS PCT: 1 %
Basophils Absolute: 77 cells/uL (ref 0–200)
EOS PCT: 7 %
Eosinophils Absolute: 539 cells/uL — ABNORMAL HIGH (ref 15–500)
HCT: 41.9 % (ref 38.5–50.0)
Hemoglobin: 14.1 g/dL (ref 13.2–17.1)
LYMPHS PCT: 25 %
Lymphs Abs: 1925 cells/uL (ref 850–3900)
MCH: 31.7 pg (ref 27.0–33.0)
MCHC: 33.7 g/dL (ref 32.0–36.0)
MCV: 94.2 fL (ref 80.0–100.0)
MONOS PCT: 13 %
MPV: 9.1 fL (ref 7.5–12.5)
Monocytes Absolute: 1001 cells/uL — ABNORMAL HIGH (ref 200–950)
Neutro Abs: 4158 cells/uL (ref 1500–7800)
Neutrophils Relative %: 54 %
PLATELETS: 332 10*3/uL (ref 140–400)
RBC: 4.45 MIL/uL (ref 4.20–5.80)
RDW: 13.4 % (ref 11.0–15.0)
WBC: 7.7 10*3/uL (ref 3.8–10.8)

## 2017-02-27 LAB — HEPATIC FUNCTION PANEL
ALBUMIN: 4.3 g/dL (ref 3.6–5.1)
ALT: 26 U/L (ref 9–46)
AST: 22 U/L (ref 10–35)
Alkaline Phosphatase: 79 U/L (ref 40–115)
BILIRUBIN DIRECT: 0.2 mg/dL (ref <=0.2)
BILIRUBIN TOTAL: 0.7 mg/dL (ref 0.2–1.2)
Indirect Bilirubin: 0.5 mg/dL (ref 0.2–1.2)
Total Protein: 7.1 g/dL (ref 6.1–8.1)

## 2017-02-27 LAB — TSH: TSH: 5.44 m[IU]/L — AB (ref 0.40–4.50)

## 2017-02-27 LAB — MAGNESIUM: Magnesium: 1.9 mg/dL (ref 1.5–2.5)

## 2017-02-27 MED ORDER — AMLODIPINE-OLMESARTAN 5-40 MG PO TABS: 1.0000 | ORAL_TABLET | Freq: Every day | ORAL | 1 refills | Status: DC

## 2017-02-27 MED ORDER — METOPROLOL TARTRATE 25 MG PO TABS: 25.0000 mg | ORAL_TABLET | Freq: Two times a day (BID) | ORAL | 1 refills | Status: DC

## 2017-02-27 MED ORDER — SERTRALINE HCL 50 MG PO TABS: 50.0000 mg | ORAL_TABLET | Freq: Every day | ORAL | 2 refills | Status: DC

## 2017-02-27 NOTE — Progress Notes (Signed)
Patient ID: Samuel Sharp, male   DOB: 1961-01-15, 56 y.o.   MRN: [id number]  Assessment and Plan:  Hypertension:  -Continue medication, azor samples given -monitor blood pressure at home.  -Continue DASH diet.   -Reminder to go to the ER if any CP, SOB, nausea, dizziness, severe HA, changes vision/speech, left arm numbness and tingling, and jaw pain.  Cholesterol: -Continue diet and exercise.  -Check cholesterol NEXT OV  Hypothyroidism -check TSH level, continue medications the same, reminded to take on an empty stomach 30-46mins before food.   Obesity with co morbidities - long discussion about weight loss, diet, and exercise  Vitamin D Def: -check level -continue medications.   COPD -currently well controlled  -anoro and arnuity samples given  Anxiety Ativan 1mg  TID refilled, will add on zoloft to see if we can try to decrease ativan, will not decrease at this time but he will try to cut back   Continue diet and meds as discussed. Further disposition pending results of labs. Future Appointments Date Time Provider Department Center  03/01/2017 8:15 AM Samuel Mark Donnelly Stager, MD [facility] None  06/21/2017 10:00 AM Samuel Mulling, PA-C [facility] None    HPI 56 y.o. male  presents for 3 month follow up with hypertension, hyperlipidemia, prediabetes and vitamin D.   His blood pressure has been controlled at home, today their BP is BP: 138/76.   He does workout. He denies chest pain, shortness of breath, dizziness.    He states that he was backed down from 2mg  to 1 mg TID, he is interested in slowly backing off after the holidays and continuing this, not interested in SSRI/SNRI at this time but may in the future if unable to take the ativan PRN which is his goal.   He has asthma/COPD, he is on anoro and Qvar, will use albuterol 2 x daily with allergies.   He is on thyroid medication. His medication was changed last visit however .   Lab Results  Component Value Date   TSH 2.57 06/12/2016     He is on cholesterol medication and denies myalgias. His cholesterol is at goal. The cholesterol last visit was:   Lab Results  Component Value Date   CHOL 160 06/12/2016   HDL 53 06/12/2016   LDLCALC 80 06/12/2016   TRIG 134 06/12/2016   CHOLHDL 3.0 06/12/2016    He has been working on diet and exercise for prediabetes, and denies foot ulcerations, hyperglycemia, hypoglycemia , increased appetite, nausea, paresthesia of the feet, polydipsia, polyuria, visual disturbances, vomiting and weight loss. Last A1C in the office was:  Lab Results  Component Value Date   HGBA1C 5.4 06/12/2016   Patient is on Vitamin D supplement.  Lab Results  Component Value Date   VD25OH 97 06/12/2016     BMI is Body mass index is 31.19 kg/m., he is working on diet and exercise. Wt Readings from Last 3 Encounters:  02/27/17 238 lb (108 kg)  11/27/16 232 lb (105.2 kg)  06/12/16 224 lb (101.6 kg)   Current Medications:  Current Outpatient Prescriptions on File Prior to Visit  Medication Sig Dispense Refill  . amLODipine-olmesartan (AZOR) 5-40 MG tablet TAKE ONE TABLET BY MOUTH ONCE DAILY 30 tablet 3  . ANORO ELLIPTA 62.5-25 MCG/INH AEPB TAKE 1 PUFF BY MOUTH EVERY DAY 60 each 11  . atorvastatin (LIPITOR) 40 MG tablet TAKE ONE TABLET BY MOUTH ONCE DAILY 90 tablet 0  . beclomethasone (QVAR) 80 MCG/ACT inhaler 2 inhalations daily 1  Inhaler 12  . clobetasol ointment (TEMOVATE) 0.05 % Apply as needed    . Flaxseed, Linseed, (EQL FLAX SEED OIL) 1000 MG CAPS Take 1,000 mg by mouth daily.    Marland Kitchen HUMIRA PEN 40 MG/0.8ML PNKT     . levothyroxine (SYNTHROID, LEVOTHROID) 100 MCG tablet TAKE 1 TABLET (100 MCG TOTAL) BY MOUTH DAILY. 90 tablet 1  . LORazepam (ATIVAN) 1 MG tablet TAKE A HALF TO ONE TABLET BY MOUTH THREE TIMES DAILY AS NEEDED ONLY FOR ANXIETY 90 tablet 1  . metoprolol tartrate (LOPRESSOR) 25 MG tablet TAKE ONE TABLET BY MOUTH TWICE DAILY 60 tablet 2  . Multiple Vitamins-Minerals (MULTIVITAMIN WITH  MINERALS) tablet Take 1 tablet by mouth daily.    . Omega-3 Fatty Acids (FISH OIL) 1000 MG CAPS Take 1,000 mg by mouth daily.    . predniSONE (STERAPRED UNI-PAK 21 TAB) 10 MG (21) TBPK tablet Take as directed 21 tablet 0  . PROAIR HFA 108 (90 BASE) MCG/ACT inhaler INHALE 2 PUFFS INTO THE LUNGS EVERY 4 HOURS AS NEEDED FOR WHEEZING OR SHORTNESS OF BREATH. 18 g 3  . triamcinolone ointment (KENALOG) 0.1 % APPLY ONE OINTMENT TOPICALLY TWICE DAILY 80 g 1   No current facility-administered medications on file prior to visit.     Medical History:  Past Medical History:  Diagnosis Date  . Asthma    Present all life. Dx as child. Been in [facility] since 2003--pft 06/03/10 FEV1 3.14/ 82%, FEV1/FVC 0.54; insig resp to BD, DLCO 75%, ni lUNG  Vol  . Detached retina   . History of TB skin testing    negative 2005-2010  . Hyperlipidemia   . Hypertension    on lisinopril 2002/2003--changed ARB march 2011  . Lung nodule    5mm on ct 11/10/10- likely lymph node  . Palpitations    started atenolol since 2003. recollects hx of holter. 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 informed it "Not a dangerous condition"-changed to lopressor 12/15/2010  . Psoriasis    on enbre;l since 005. started in [place]. Stopped 2011 by [place] derm due to nodule on chest  . Pulmonary embolism (HCC)    does not recollect tpA or being on lot of o2 or hypotension. 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 Ace Inhibitors     cough  . Haldol [Haloperidol Lactate]     psychosis  . Haloperidol Lactate     REACTION: phsycotic break     Review of Systems:  Review of Systems  Constitutional: Negative for chills, diaphoresis and fever.  HENT: Negative for congestion, ear pain and sore throat.   Eyes: Negative for blurred vision and double vision.  Respiratory: Negative for cough, shortness of breath and wheezing.   Cardiovascular: Negative for chest  pain, palpitations and leg swelling.  Gastrointestinal: Positive for heartburn. Negative for blood in stool, constipation, diarrhea and melena.  Genitourinary: Negative.   Neurological: Negative for headaches.    Family history- Review and unchanged  Social history- Review and unchanged  Physical Exam: BP 138/76   Pulse 83   Temp 97.3 F (36.3 C)   Resp 16   Ht 6' 1.25" (1.861 m)   Wt 238 lb (108 kg)   SpO2 98%   BMI 31.19 kg/m  Wt Readings from Last 3 Encounters:  02/27/17 238 lb (108 kg)  11/27/16 232 lb (105.2 kg)  06/12/16 224 lb (101.6  kg)    General Appearance: Well nourished well developed, in no apparent distress. Eyes: PERRLA, EOMs, conjunctiva no swelling or erythema ENT/Mouth: Ear canals normal without obstruction, swelling, erythma, discharge.  TMs normal bilaterally.  Oropharynx moist, clear, without exudate, or postoropharyngeal swelling. Neck: Supple, thyroid normal,no cervical adenopathy  Respiratory: Respiratory effort normal, Breath sounds clear A&P without rhonchi, wheeze, or rale.  No retractions, no accessory usage. Cardio: RRR with no MRGs. Brisk peripheral pulses without edema.  Abdomen: Soft, + BS,  Non tender, no guarding, rebound, hernias, masses. Musculoskeletal: Full ROM, 5/5 strength, Normal gait Skin: Warm, dry without rashes, lesions, ecchymosis.  Neuro: Awake and oriented X 3, Cranial nerves intact. Normal muscle tone, no cerebellar symptoms. Psych: Normal affect, Insight and Judgment appropriate.    Samuel MullingAmanda Jabarri Stefanelli, PA-C 3:31 PM [facility],

## 2017-02-27 NOTE — Patient Instructions (Signed)
Try the zoloft once daily Goal is to decrease need of ativan.    Palpitations A palpitation is the feeling that your heartbeat is irregular or is faster than normal. It may feel like your heart is fluttering or skipping a beat. Palpitations are usually not a serious problem. They may be caused by many things, including smoking, caffeine, alcohol, stress, and certain medicines. Although most causes of palpitations are not serious, palpitations can be a sign of a serious medical problem. In some cases, you may need further medical evaluation. Follow these instructions at home: Pay attention to any changes in your symptoms. Take these actions to help with your condition:  Avoid the following:  Caffeinated coffee, tea, soft drinks, diet pills, and energy drinks.  Chocolate.  Alcohol.  Do not use any tobacco products, such as cigarettes, chewing tobacco, and e-cigarettes. If you need help quitting, ask your health care provider.  Try to reduce your stress and anxiety. Things that can help you relax include:  Yoga.  Meditation.  Physical activity, such as swimming, jogging, or walking.  Biofeedback. This is a method that helps you learn to use your mind to control things in your body, such as your heartbeats.  Get plenty of rest and sleep.  Take over-the-counter and prescription medicines only as told by your health care provider.  Keep all follow-up visits as told by your health care provider. This is important. Contact a health care provider if:  You continue to have a fast or irregular heartbeat after 24 hours.  Your palpitations occur more often. Get help right away if:  You have chest pain or shortness of breath.  You have a severe headache.  You feel dizzy or you faint. This information is not intended to replace advice given to you by your health care provider. Make sure you discuss any questions you have with your health care provider. Document Released: 12/08/2000  Document Revised: 05/15/2016 Document Reviewed: 08/26/2015 Elsevier Interactive Patient Education  2017 [employer]

## 2017-02-28 ENCOUNTER — Other Ambulatory Visit: Payer: Self-pay | Admitting: *Deleted

## 2017-02-28 ENCOUNTER — Other Ambulatory Visit: Payer: Self-pay | Admitting: Physician Assistant

## 2017-02-28 DIAGNOSIS — E039 Hypothyroidism, unspecified: Secondary | ICD-10-CM

## 2017-02-28 LAB — HEMOGLOBIN A1C
Hgb A1c MFr Bld: 5.2 % (ref <5.7)
MEAN PLASMA GLUCOSE: 103 mg/dL

## 2017-02-28 MED ORDER — LEVOTHYROXINE SODIUM 100 MCG PO TABS: ORAL_TABLET | ORAL | 1 refills | Status: DC

## 2017-02-28 NOTE — Progress Notes (Signed)
LVM for pt to return office call for LAB results.

## 2017-02-28 NOTE — Progress Notes (Signed)
Pt aware of lab results & voiced understanding of those results.

## 2017-03-01 ENCOUNTER — Encounter (INDEPENDENT_AMBULATORY_CARE_PROVIDER_SITE_OTHER): Payer: Self-pay | Admitting: Orthopaedic Surgery

## 2017-03-01 ENCOUNTER — Ambulatory Visit (INDEPENDENT_AMBULATORY_CARE_PROVIDER_SITE_OTHER): Payer: PRIVATE HEALTH INSURANCE | Admitting: Orthopaedic Surgery

## 2017-03-01 DIAGNOSIS — G8929 Other chronic pain: Secondary | ICD-10-CM

## 2017-03-01 DIAGNOSIS — M25561 Pain in right knee: Secondary | ICD-10-CM

## 2017-03-01 NOTE — Progress Notes (Signed)
Office Visit Note   Patient: Samuel Sharp           Date of Birth: [birth year]           MRN: [id number] Visit Date: 03/01/2017              Requested by: Lucky Cowboy, MD [address] [place], [place] [address] PCP: Nadean Corwin, MD   Assessment & Plan: Visit Diagnoses:  1. Chronic pain of right knee     Plan: Right knee was aspirated and injected he tolerated this well. I'll see him back as needed.  Follow-Up Instructions: Return if symptoms worsen or fail to improve.   Orders:  No orders of the defined types were placed in this encounter.  No orders of the defined types were placed in this encounter.     Procedures: Large Joint Inj Date/Time: 03/01/2017 8:48 AM Performed by: Tarry Kos Authorized by: Tarry Kos   Consent Given by:  Patient Timeout: prior to procedure the correct patient, procedure, and site was verified   Indications:  Pain Location:  Knee Site:  R knee Prep: patient was prepped and draped in usual sterile fashion   Needle Size:  22 G Ultrasound Guidance: No   Fluoroscopic Guidance: No   Arthrogram: No   Aspiration Attempted: Yes   Aspirate amount (mL):  8 Aspirate:  Clear Patient tolerance:  Patient tolerated the procedure well with no immediate complications     Clinical Data: No additional findings.   Subjective: Chief Complaint  Patient presents with  . Right Knee - Pain    Patient comes back today for continued right knee pain. His psoriasis is under better control now.    Review of Systems   Objective: Vital Signs: There were no vitals taken for this visit.  Physical Exam  Ortho Exam Exam right knee shows a small effusion. His psoriasis plaques are not flared up. Specialty Comments:  No specialty comments available.  Imaging: No results found.   PMFS History: Patient Active Problem List   Diagnosis Date Noted  . Hypothyroidism 11/27/2016  . Generalized anxiety disorder  11/27/2016  . Abnormal glucose 06/09/2015  . Vitamin D deficiency 06/09/2015  . Medication management 06/09/2015  . Aortic root aneurysm (HCC) 09/15/2014  . CAD (coronary artery disease) 09/02/2013  . Asthma with chronic obstructive pulmonary disease (COPD) (HCC) 05/29/2011  . Psoriasis 05/29/2011  . Solitary pulmonary nodule 12/15/2010  . GERD 06/03/2010  . Mixed hyperlipidemia 05/02/2010  . Essential hypertension 05/02/2010  . History of pulmonary embolus (PE) 05/02/2010   Past Medical History:  Diagnosis Date  . Asthma    Present all life. Dx as child. Been in [facility] since 2003--pft 06/03/10 FEV1 3.14/ 82%, FEV1/FVC 0.54; insig resp to BD, DLCO 75%, ni lUNG  Vol  . Detached retina   . History of TB skin testing    negative 2005-2010  . Hyperlipidemia   . Hypertension    on lisinopril 2002/2003--changed ARB march 2011  . Lung nodule    5mm on ct 11/10/10- likely lymph node  . Palpitations    started atenolol since 2003. recollects hx of holter. 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 informed it "Not a dangerous condition"-changed to lopressor 12/15/2010  . Psoriasis    on enbre;l since 005. started in [place]. Stopped 2011 by [place] derm due to nodule on chest  . Pulmonary embolism (HCC)    does not recollect tpA or being on lot of o2 or  hypotension. 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 Emphysema Father   . Asthma Mother   . Allergies Mother     Past Surgical History:  Procedure Laterality Date  . RETINAL DETACHMENT SURGERY    . VIDEO BRONCHOSCOPY  07/26/2012   Procedure: VIDEO BRONCHOSCOPY WITH FLUORO;  Surgeon: Alyson ReedyWesam G Yacoub, MD;  [facility];  Service: Cardiopulmonary;  Laterality: Bilateral;   Social History   Occupational History  . Not on file.   Social History Main Topics  . Smoking status: Former Smoker    Packs/day: 2.00    Years: 35.00    Types: Cigarettes     Quit date: 03/25/2010  . Smokeless tobacco: Never Used  . Alcohol use 7.2 oz/week    12 Standard drinks or equivalent per week     Comment: every other day, 4-5drinks  . Drug use: No  . Sexual activity: No

## 2017-03-19 ENCOUNTER — Other Ambulatory Visit: Payer: Self-pay | Admitting: Physician Assistant

## 2017-03-19 DIAGNOSIS — F411 Generalized anxiety disorder: Secondary | ICD-10-CM

## 2017-03-19 NOTE — Telephone Encounter (Signed)
Ativan was called into pharmacy @ 3:27pm on 26th March 2018 by DD

## 2017-04-02 ENCOUNTER — Other Ambulatory Visit: Payer: Self-pay | Admitting: *Deleted

## 2017-04-02 ENCOUNTER — Other Ambulatory Visit: Payer: 59

## 2017-04-02 ENCOUNTER — Other Ambulatory Visit: Payer: Self-pay

## 2017-04-02 DIAGNOSIS — E039 Hypothyroidism, unspecified: Secondary | ICD-10-CM

## 2017-04-02 LAB — TSH: TSH: 2.03 m[IU]/L (ref 0.40–4.50)

## 2017-04-02 NOTE — Progress Notes (Signed)
Pt presents for TSH & states he does take his thyroid meds as directed by provider.

## 2017-04-02 NOTE — Addendum Note (Signed)
Addended by: Rodney Booze D on: 04/02/2017 04:46 PM   Modules accepted: Orders

## 2017-04-03 NOTE — Progress Notes (Signed)
LVM for pt to return office call for LAB results.

## 2017-04-03 NOTE — Progress Notes (Signed)
Pt aware of lab results & voiced understanding of those results.

## 2017-04-16 ENCOUNTER — Other Ambulatory Visit: Payer: Self-pay | Admitting: Physician Assistant

## 2017-04-16 DIAGNOSIS — F411 Generalized anxiety disorder: Secondary | ICD-10-CM

## 2017-04-16 NOTE — Telephone Encounter (Signed)
Pt was informed to get in contact psych as well. Pt states he has been taking ativan for 20 yrs & can not understand why he has to decrease use of his ativan.

## 2017-04-16 NOTE — Telephone Encounter (Signed)
Ativan was called into pharmacy @ 3:06 pm on 23rd April 2018 by DD

## 2017-04-18 ENCOUNTER — Other Ambulatory Visit: Payer: Self-pay | Admitting: Internal Medicine

## 2017-04-23 ENCOUNTER — Ambulatory Visit (INDEPENDENT_AMBULATORY_CARE_PROVIDER_SITE_OTHER): Payer: 59 | Admitting: Physician Assistant

## 2017-04-23 ENCOUNTER — Encounter: Payer: Self-pay | Admitting: Physician Assistant

## 2017-04-23 VITALS — BP 138/90 | HR 74 | Temp 97.5°F | Resp 16 | Ht 73.25 in | Wt 236.8 lb

## 2017-04-23 DIAGNOSIS — I10 Essential (primary) hypertension: Secondary | ICD-10-CM | POA: Diagnosis not present

## 2017-04-23 DIAGNOSIS — H9201 Otalgia, right ear: Secondary | ICD-10-CM | POA: Diagnosis not present

## 2017-04-23 DIAGNOSIS — R42 Dizziness and giddiness: Secondary | ICD-10-CM | POA: Diagnosis not present

## 2017-04-23 LAB — CBC WITH DIFFERENTIAL/PLATELET
BASOS PCT: 1 %
Basophils Absolute: 84 cells/uL (ref 0–200)
EOS PCT: 6 %
Eosinophils Absolute: 504 cells/uL — ABNORMAL HIGH (ref 15–500)
HCT: 39.4 % (ref 38.5–50.0)
HEMOGLOBIN: 13.1 g/dL — AB (ref 13.2–17.1)
LYMPHS ABS: 2604 {cells}/uL (ref 850–3900)
Lymphocytes Relative: 31 %
MCH: 31.3 pg (ref 27.0–33.0)
MCHC: 33.2 g/dL (ref 32.0–36.0)
MCV: 94.3 fL (ref 80.0–100.0)
MPV: 9.8 fL (ref 7.5–12.5)
Monocytes Absolute: 1008 cells/uL — ABNORMAL HIGH (ref 200–950)
Monocytes Relative: 12 %
NEUTROS ABS: 4200 {cells}/uL (ref 1500–7800)
Neutrophils Relative %: 50 %
PLATELETS: 273 10*3/uL (ref 140–400)
RBC: 4.18 MIL/uL — AB (ref 4.20–5.80)
RDW: 13.4 % (ref 11.0–15.0)
WBC: 8.4 10*3/uL (ref 3.8–10.8)

## 2017-04-23 MED ORDER — METOPROLOL TARTRATE 50 MG PO TABS: 50.0000 mg | ORAL_TABLET | Freq: Two times a day (BID) | ORAL | 0 refills | Status: DC

## 2017-04-23 MED ORDER — MECLIZINE HCL 25 MG PO TABS: ORAL_TABLET | ORAL | 0 refills | Status: DC

## 2017-04-23 NOTE — Progress Notes (Signed)
Subjective:    Patient ID: Samuel Sharp, male    DOB: 02-27-1961, 56 y.o.   MRN: [id number]  Dizziness  Pertinent negatives include no headaches, numbness, sore throat or weakness.   [name] presents with vertigo, he states he had an episode 1 year ago for 1-2 days, then the other day when he was laying in bed it started. Worse with lying down, worse with bending down. No fever, chills, no HA, no changes in vision, diarrhea, constipation, rashes, weakness.  He is having nausea without vomiting, sinus issues, cough, SOB, CP.   Was on zoloft but was getting palpations worsened so stop it, we are trying to cut back on valium use from 1 TID.  Blood pressure 138/90, pulse 74, temperature 97.5 F (36.4 C), resp. rate 16, height 6' 1.25" (1.861 m), weight 236 lb 12.8 oz (107.4 kg), SpO2 95 %.  Medications Current Outpatient Prescriptions on File Prior to Visit  Medication Sig  . amLODipine-olmesartan (AZOR) 5-40 MG tablet Take 1 tablet by mouth daily.  Ailene Ards ELLIPTA 62.5-25 MCG/INH AEPB TAKE 1 PUFF BY MOUTH EVERY DAY  . atorvastatin (LIPITOR) 40 MG tablet TAKE ONE TABLET BY MOUTH ONCE DAILY  . beclomethasone (QVAR) 80 MCG/ACT inhaler 2 inhalations daily  . clobetasol ointment (TEMOVATE) 0.05 % Apply as needed  . Flaxseed, Linseed, (EQL FLAX SEED OIL) 1000 MG CAPS Take 1,000 mg by mouth daily.  Marland Kitchen HUMIRA PEN 40 MG/0.8ML PNKT   . levothyroxine (SYNTHROID, LEVOTHROID) 100 MCG tablet TAKE 1 TABLET (100 MCG TOTAL) BY MOUTH DAILY.  Marland Kitchen LORazepam (ATIVAN) 1 MG tablet TAKE 1/2 TO 1 (ONE-HALF TO ONE) TABLET BY MOUTH THREE TIMES DAILY AS NEEDED   . metoprolol tartrate (LOPRESSOR) 25 MG tablet Take 1 tablet (25 mg total) by mouth 2 (two) times daily.  . Multiple Vitamins-Minerals (MULTIVITAMIN WITH MINERALS) tablet Take 1 tablet by mouth daily.  . Omega-3 Fatty Acids (FISH OIL) 1000 MG CAPS Take 1,000 mg by mouth daily.  Marland Kitchen PROAIR HFA 108 (90 BASE) MCG/ACT inhaler INHALE 2 PUFFS INTO THE LUNGS EVERY 4  HOURS AS NEEDED FOR WHEEZING OR SHORTNESS OF BREATH.  . triamcinolone ointment (KENALOG) 0.1 % APPLY ONE OINTMENT TOPICALLY TWICE DAILY   No current facility-administered medications on file prior to visit.     Problem list He has Mixed hyperlipidemia; Essential hypertension; History of pulmonary embolus (PE); GERD; Solitary pulmonary nodule; Asthma with chronic obstructive pulmonary disease (COPD) (HCC); Psoriasis; CAD (coronary artery disease); Aortic root aneurysm (HCC); Abnormal glucose; Vitamin D deficiency; Medication management; Hypothyroidism; and Generalized anxiety disorder on his problem list.   Review of Systems  Constitutional: Negative.   HENT: Positive for ear pain (right). Negative for hearing loss, postnasal drip, rhinorrhea, sinus pain, sinus pressure and sore throat.   Respiratory: Negative.   Cardiovascular: Negative.   Gastrointestinal: Negative.   Genitourinary: Negative.   Neurological: Positive for dizziness. Negative for tremors, seizures, syncope, facial asymmetry, speech difficulty, weakness, light-headedness, numbness and headaches.  Psychiatric/Behavioral: Negative.        Objective:   Physical Exam  Constitutional: He appears well-developed and well-nourished.  HENT:  Right Ear: Hearing, external ear and ear canal normal. No mastoid tenderness. Tympanic membrane is not perforated and not erythematous. No middle ear effusion.  Left Ear: Hearing and external ear normal. A foreign body (large obscuring cerumen impaction with pain with any manipulation) is present. No mastoid tenderness. Tympanic membrane is not perforated (appears intact but difficult to see due  to cerumen) and not erythematous.  No middle ear effusion.  Eyes: Conjunctivae are normal. Pupils are equal, round, and reactive to light. Right pupil is reactive. Left pupil is reactive. Pupils are equal (due to previous surgery, unchanged).  Neck: Normal range of motion. Neck supple.  Cardiovascular:  Normal rate and regular rhythm.   Pulmonary/Chest: Effort normal and breath sounds normal. He has no wheezes.  Lymphadenopathy:    He has no cervical adenopathy.      Assessment & Plan:  Vertigo- nonsmoker, normal neuro, does not have history of vertigo in the past, meclizine PRN, exercises given, add bASA, if worsening HA, changes vision/speech, imbalance, weakness go to the ER Check labs Refer to ENT for right ear pain/cerumen impaction  Anxiety Increase metoprolol to  BId, start to decrease valium as discussed

## 2017-04-23 NOTE — Patient Instructions (Signed)

## 2017-04-24 LAB — HEPATIC FUNCTION PANEL
ALT: 27 U/L (ref 9–46)
AST: 22 U/L (ref 10–35)
Albumin: 4.2 g/dL (ref 3.6–5.1)
Alkaline Phosphatase: 72 U/L (ref 40–115)
BILIRUBIN DIRECT: 0.1 mg/dL (ref <=0.2)
BILIRUBIN INDIRECT: 0.6 mg/dL (ref 0.2–1.2)
BILIRUBIN TOTAL: 0.7 mg/dL (ref 0.2–1.2)
Total Protein: 7 g/dL (ref 6.1–8.1)

## 2017-04-24 LAB — BASIC METABOLIC PANEL WITH GFR
BUN: 26 mg/dL — ABNORMAL HIGH (ref 7–25)
CHLORIDE: 102 mmol/L (ref 98–110)
CO2: 23 mmol/L (ref 20–31)
CREATININE: 1.02 mg/dL (ref 0.70–1.33)
Calcium: 9.7 mg/dL (ref 8.6–10.3)
GFR, EST NON AFRICAN AMERICAN: 81 mL/min (ref >=60)
GFR, Est African American: >89 mL/min (ref >=60)
Glucose, Bld: 79 mg/dL (ref 65–99)
POTASSIUM: 4.4 mmol/L (ref 3.5–5.3)
SODIUM: 138 mmol/L (ref 135–146)

## 2017-04-26 NOTE — Progress Notes (Signed)
LVM for pt to return office call for LAB results.

## 2017-04-27 NOTE — Progress Notes (Signed)
Pt aware of lab results & voiced understanding of those results. Pt states he drinks 200 or 300 oz of water a day so he can not be dehydrated there must be something else wrong. Pt understands that he can just get an allergy pill over the counter.

## 2017-05-14 ENCOUNTER — Other Ambulatory Visit: Payer: Self-pay | Admitting: Physician Assistant

## 2017-05-14 DIAGNOSIS — F411 Generalized anxiety disorder: Secondary | ICD-10-CM

## 2017-05-14 NOTE — Telephone Encounter (Signed)
Pt was informed of #75 tablets being called into pharmacy.  Pt agreed & hung up.

## 2017-05-14 NOTE — Telephone Encounter (Signed)
Ativan was called into pharmacy for #75 on 21st May 2018 @ 2:31 by DD

## 2017-05-17 ENCOUNTER — Other Ambulatory Visit: Payer: Self-pay

## 2017-05-17 MED ORDER — BECLOMETHASONE DIPROPIONATE 80 MCG/ACT IN AERS: INHALATION_SPRAY | RESPIRATORY_TRACT | 12 refills | Status: DC

## 2017-05-18 ENCOUNTER — Other Ambulatory Visit: Payer: Self-pay | Admitting: Physician Assistant

## 2017-05-18 MED ORDER — FLUTICASONE FUROATE-VILANTEROL 100-25 MCG/INH IN AEPB: 1.0000 | INHALATION_SPRAY | Freq: Every day | RESPIRATORY_TRACT | 2 refills | Status: DC

## 2017-05-31 ENCOUNTER — Encounter: Payer: Self-pay | Admitting: Pulmonary Disease

## 2017-05-31 ENCOUNTER — Ambulatory Visit (INDEPENDENT_AMBULATORY_CARE_PROVIDER_SITE_OTHER): Payer: 59 | Admitting: Pulmonary Disease

## 2017-05-31 VITALS — BP 124/80 | HR 81 | Ht 73.0 in | Wt 234.0 lb

## 2017-05-31 DIAGNOSIS — R911 Solitary pulmonary nodule: Secondary | ICD-10-CM | POA: Diagnosis not present

## 2017-05-31 DIAGNOSIS — K219 Gastro-esophageal reflux disease without esophagitis: Secondary | ICD-10-CM

## 2017-05-31 DIAGNOSIS — J439 Emphysema, unspecified: Secondary | ICD-10-CM | POA: Diagnosis not present

## 2017-05-31 DIAGNOSIS — Z23 Encounter for immunization: Secondary | ICD-10-CM | POA: Diagnosis not present

## 2017-05-31 DIAGNOSIS — J449 Chronic obstructive pulmonary disease, unspecified: Secondary | ICD-10-CM | POA: Diagnosis not present

## 2017-05-31 DIAGNOSIS — IMO0001 Reserved for inherently not codable concepts without codable children: Secondary | ICD-10-CM

## 2017-05-31 MED ORDER — PANTOPRAZOLE SODIUM 40 MG PO TBEC: 40.0000 mg | DELAYED_RELEASE_TABLET | ORAL | 3 refills | Status: DC

## 2017-05-31 MED ORDER — FLUTICASONE-UMECLIDIN-VILANT 100-62.5-25 MCG/INH IN AEPB: 1.0000 | INHALATION_SPRAY | Freq: Every day | RESPIRATORY_TRACT | 0 refills | Status: DC

## 2017-05-31 NOTE — Patient Instructions (Addendum)
   I want you to try to scale back on your spicy foods some.  Start taking your Ranitidine every night but take 300mg .  Remember to take the Pantoprazole pill we are prescribing for your reflux/heartburn on an empty stomach at least 30 minutes before your breakfast in the morning.   Call and let me know how your breathing is doing on the Trelegy inhaler so we can either send you in a prescription or try something different.  Starting tomorrow the Trelegy sample will replace your Breo & Anoro inhalers.  We will review your test results when I see you back but let me know if you have any questions or concerns.  TESTS ORDERED: 1. CT Chest w/o 2. Full PFTs before next appointment

## 2017-05-31 NOTE — Progress Notes (Signed)
Patient seen in the office today and instructed on use of Trelegy.  Patient expressed understanding and demonstrated technique.  

## 2017-05-31 NOTE — Progress Notes (Signed)
Subjective:    Patient ID: Samuel Sharp, male    DOB: 10/03/1961, 56 y.o.   MRN: [id number]  HPI Patient last seen in 2013 by Dr. Marchelle Gearingamaswamy and found to have not only COPD at that time but also a pleural effusion and pneumonia while on Humira. She underwent a diagnostic thoracentesis as well as bronchoscopy with lavage, brushings, and transbronchial biopsy of his right upper lobe. Previously he was smoking. At that time he was on Advair and was given samples of Tudorza. Since then [facility] but hadn't used his Qvar in about 2-3 months prior. He reports that he was "doing well" but feels like his breathing has been getting worse over the last couple of months. He has attributed this to the increase in heat. He denies any recurrent wheezing or coughing. He does wake up with dyspnea but this has only happened a couple of times over the years. He does feel like he has trouble getting a "full breath". He denies any chest pain, pressure, or tightness. He does have intermittent palpitations. He doesn't use a rescue inhaler because he feels it doesn't seem to help. He has been on Breo for 1 month. He has been using Anoro for about 2 years. He denies any history of recurrent bronchitis. He did have a pulmonary embolism about 10 years ago and was treated with anticoagulation for a year. He reports as a child he had asthma. He does have swelling his calves intermittently after he has been at work. No syncope or near syncope. He has experienced vertigo recently. He reports reflux on a daily basis but has no morning brash water taste. He takes Ranitidine. He denies any sinus congestion, pressure or drainage. No morning headaches. No easy dozing.   Review of Systems No rashes or bruising. No dysuria or hematuria. A pertinent 14 point review of systems is negative except as per the history of presenting illness.  Allergies  Allergen Reactions  . Ace  Inhibitors     cough  . Haldol [Haloperidol Lactate]     psychosis  . Haloperidol Lactate     REACTION: phsycotic break    Current Outpatient Prescriptions on File Prior to Visit  Medication Sig Dispense Refill  . amLODipine-olmesartan (AZOR) 5-40 MG tablet Take 1 tablet by mouth daily. 90 tablet 1  . ANORO ELLIPTA 62.5-25 MCG/INH AEPB TAKE 1 PUFF BY MOUTH EVERY DAY 60 each 11  . atorvastatin (LIPITOR) 40 MG tablet TAKE ONE TABLET BY MOUTH ONCE DAILY 90 tablet 0  . clobetasol ointment (TEMOVATE) 0.05 % Apply as needed    . Flaxseed, Linseed, (EQL FLAX SEED OIL) 1000 MG CAPS Take 1,000 mg by mouth daily.    . fluticasone furoate-vilanterol (BREO ELLIPTA) 100-25 MCG/INH AEPB Inhale 1 puff into the lungs daily. Rinse mouth with water after each use 1 each 2  . HUMIRA PEN 40 MG/0.8ML PNKT     . levothyroxine (SYNTHROID, LEVOTHROID) 100 MCG tablet TAKE 1 TABLET (100 MCG TOTAL) BY MOUTH DAILY. 90 tablet 1  . LORazepam (ATIVAN) 1 MG tablet Take 1/2-1 tablet up to 3 times a day however we are cutting back on amount available, should NOT take 3 x a day 75 tablet 0  . meclizine (ANTIVERT) 25 MG tablet 1/2-1 pill up to 3 times daily for motion sickness/dizziness 60 tablet 0  . metoprolol tartrate (LOPRESSOR) 50 MG tablet Take 1 tablet (50 mg total) by  mouth 2 (two) times daily. 180 tablet 0  . Multiple Vitamins-Minerals (MULTIVITAMIN WITH MINERALS) tablet Take 1 tablet by mouth daily.    . Omega-3 Fatty Acids (FISH OIL) 1000 MG CAPS Take 1,000 mg by mouth daily.    Marland Kitchen triamcinolone ointment (KENALOG) 0.1 % APPLY ONE OINTMENT TOPICALLY TWICE DAILY 80 g 1  . beclomethasone (QVAR) 80 MCG/ACT inhaler 2 inhalations daily (Patient not taking: Reported on 05/31/2017) 1 Inhaler 12  . PROAIR HFA 108 (90 BASE) MCG/ACT inhaler INHALE 2 PUFFS INTO THE LUNGS EVERY 4 HOURS AS NEEDED FOR WHEEZING OR SHORTNESS OF BREATH. (Patient not taking: Reported on 05/31/2017) 18 g 3   No current facility-administered medications on  file prior to visit.     Past Medical History:  Diagnosis Date  . Asthma    Present all life. Dx as child. Been in [facility] since 2003--pft 06/03/10 FEV1 3.14/ 82%, FEV1/FVC 0.54; insig resp to BD, DLCO 75%, ni lUNG  Vol  . COPD (chronic obstructive pulmonary disease) (HCC)   . Detached retina   . History of TB skin testing    negative 2005-2010  . Hyperlipidemia   . Hypertension    on lisinopril 2002/2003--changed ARB march 2011  . Lung nodule    5mm on ct 11/10/10- likely lymph node  . Palpitations    started atenolol since 2003. recollects hx of holter. 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 informed it "Not a dangerous condition"-changed to lopressor 12/15/2010  . Psoriasis    on enbre;l since 005. started in [place]. Stopped 2011 by [place] derm due to nodule on chest  . Pulmonary embolism (HCC)    does not recollect tpA or being on lot of o2 or hypotension. 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 RETINAL DETACHMENT SURGERY    . THORACENTESIS    . VIDEO BRONCHOSCOPY  07/26/2012   Procedure: VIDEO BRONCHOSCOPY WITH FLUORO;  Surgeon: Alyson Reedy, MD;  Location: [facility] ENDOSCOPY;  Service: Cardiopulmonary;  Laterality: Bilateral;    Family History  Problem Relation Age of Onset  . Emphysema Father   . Asthma Mother   . Allergies Mother   . Alzheimer's disease Mother   . Alzheimer's disease Maternal Aunt     Social History   Social History  . Marital status: Single    Spouse name: N/A  . Number of children: N/A  . Years of education: N/A   Social History Main Topics  . Smoking status: Former Smoker    Packs/day: 2.00    Years: 35.00    Types: Cigarettes    Quit date: 03/25/2010  . Smokeless tobacco: Never Used  . Alcohol use 7.2 oz/week    12 Standard drinks or equivalent per week     Comment: every other day, 4-5drinks  . Drug use: No  . Sexual activity: No   Other Topics  Concern  . None   Social History Narrative   [facility] (05/31/17):   Originally from [place]. Moved to [place] in 2006. Has also lived in [place]. No history of [place]ever. No international travel. No pets currently. No bird, mold, or recent hot tub exposure. Had a hot tub 5 years ago. 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       Objective:   Physical Exam BP  124/80 (BP Location: Right Arm, Patient Position: Sitting, Cuff Size: Normal)   Pulse 81   Ht 6\' 1"  (1.854 m)   Wt 234 lb (106.1 kg)   SpO2 93%   BMI 30.87 kg/m  General:  Awake. Alert. No acute distress. Mild central obesity. Integument:  Warm & dry. No rash on exposed skin. No bruising. Extremities:  No cyanosis or clubbing.  Lymphatics:  No appreciated cervical or supraclavicular lymphadenoapthy. HEENT:  Moist mucus membranes. No oral ulcers. No scleral injection or icterus. Minimal nasal turbinate swelling. Cardiovascular:  Regular rate. No edema. No appreciable JVD.  Pulmonary:  Good aeration & clear to auscultation bilaterally. Symmetric chest wall expansion. No accessory muscle use. Abdomen: Soft. Normal bowel sounds. Nondistended. Grossly nontender. Musculoskeletal:  Normal bulk and tone. Hand grip strength 5/5 bilaterally. No joint deformity or effusion appreciated. Mild synovial thickening of bilateral PIP & DIP joints. Neurological:  CN 2-12 grossly in tact. No meningismus. Moving all 4 extremities equally. Symmetric brachioradialis deep tendon reflexes. Psychiatric:  Mood and affect congruent. Speech normal rhythm, rate & tone.   PFT 11/15/12: FVC 4.80 L (92%) FEV1 2.48 L (61%) FEV1/FVC 0.52 FEF 25-75 0.85 L (22%) 06/03/10: FVC 5.44 L (103%) FEV1 3.18 L (83%) FEV1/FVC 0.58 FEF 25-75 1.27 L (35%) negative bronchodilator response TLC 8.40 L (112%) RV 106% ERV 83% DLCO uncorrected 75%  IMAGING CXR PA/LAT 07/07/14 (personally reviewed by me):  Questionable hazy opacity right lung base. No new focal opacity. No pleural effusion. Heart normal in size & mediastinum normal in contour.  CT CHEST W/O 12/30/12 (personally reviewed by me):  Apical predominant emphysematous changes. Minimal linear opacity in right lung and at the base of the left lung consistent with scar tissue formation. Pleural thickening in the base of the right lung. 5 mm groundglass nodule in the superior right lower lobe. No pathologic mediastinal adenopathy. No pleural effusion. No pericardial effusion.  R PFA (07/23/12): 110cc Tubid, Yellow Total protein: 5.2 LDH: 1404 WBC: 1672 (neutrophils 63%, lymphs 20%, eos 12%, monocytes 5%) Cytology: No malignancy Routine culture: Negative  MICROBIOLOGY Bronchial Wash RUL (07/26/12):  AFB negative / Candida albicans / Normal Oral Flora Sputum Culture (07/23/12):  Normal Oral Flora  PATHOLOGY RUL WASH & BRUSH (07/26/12):  Negative for malignancy. RUL Transbronchial Biopsy (07/26/12):  No malignancy. Interstitial fibrosis noted.  LABS 05/25/11 Alpha-1 antitrypsin: MM    Assessment & Plan:  56 y.o. male with moderate COPD based on previous spirometry as well as right lower lobe nodule and underlying pulmonary emphysema. Review of patient's previous serum testing shows no evidence of alpha-1 antitrypsin deficiency.Given patient's right lower lobe nodule and history of tobacco use CT imaging is quite reasonable I feel. I believe his worsening dyspnea is likely multifactorial given his significant inhale dust exposure. We did discuss his uncontrolled reflux which could be also worsening his airway dysfunction. We will need to repeat pulmonary function testing to establish a new baseline. I instructed the patient to contact my office if he had any new breathing problems or questions before his next appointment.  1. Moderate COPD with emphysema:  Trying patient on Trelegy in place of Anoro & Breo. Checking full PFTs.  2. Right lower lobe  nodule:  Checking CT Chest w/o. 3. GERD:  Continuing Zantac 300mg  qhs & adding Protonix 40mg  qAM. 4. Health maintenance: Status post Pneumovax June 2011 & Tdap June 2017. Administering Prevnar vaccine today. 5. Follow-up: Return to clinic in  6 weeks or sooner if needed.  Donna Christen Jamison Neighbor, M.D. [facility] Pager:  [phone] After 3pm or if no response, call [phone] 5:20 PM 05/31/17

## 2017-06-11 ENCOUNTER — Other Ambulatory Visit: Payer: Self-pay | Admitting: Physician Assistant

## 2017-06-11 DIAGNOSIS — F411 Generalized anxiety disorder: Secondary | ICD-10-CM

## 2017-06-11 MED ORDER — LORAZEPAM 1 MG PO TABS: ORAL_TABLET | ORAL | 0 refills | Status: DC

## 2017-06-11 NOTE — Progress Notes (Signed)
LORAZEPAM WAS CALLED INTO PHARMACY ON 18TH June 2018

## 2017-06-12 ENCOUNTER — Encounter: Payer: Self-pay | Admitting: Internal Medicine

## 2017-06-15 ENCOUNTER — Telehealth: Payer: Self-pay | Admitting: Pulmonary Disease

## 2017-06-15 NOTE — Telephone Encounter (Signed)
A fax was received from patient's [facility] pharmacy [phone]) asking to send alternative as pantoprazole sodium is not cover by plan. [facility] [phone] was then contacted who stated the patient does not have any proton pump inhibitors that is covered by plan; I was informed to contact [employer] at [phone]. [employer] was contacted, Representative Prosperity stated she will fax over the PA form on Monday; will await form. Pt was contacted and made aware that a PA will be needed for this medication and will be contacted once the process is completed. He verbalized understanding.

## 2017-06-20 NOTE — Progress Notes (Signed)
Complete Physical  Assessment and Plan:  Essential hypertension - continue medications, DASH diet, exercise and monitor at home. Call if greater than 130/80.  -     CBC with Differential/Platelet -     BASIC METABOLIC PANEL WITH GFR -     Hepatic function panel -     Urinalysis, Routine w reflex microscopic -     Microalbumin / creatinine urine ratio -     EKG 12-Lead  Coronary artery disease involving native coronary artery of native heart without angina pectoris Control blood pressure, cholesterol, glucose, increase exercise.   Asthma with chronic obstructive pulmonary disease (COPD) (HCC) Follow up with pulmonary  Pulmonary emphysema, unspecified emphysema type (HCC) Follow up with pulmonary  Hypothyroidism, unspecified type Hypothyroidism-check TSH level, continue medications the same, reminded to take on an empty stomach 30-5460mins before food.  -     TSH  Gastroesophageal reflux disease, esophagitis presence not specified Continue PPI/H2 blocker, diet discussed  Psoriasis Follow up derm  Mixed hyperlipidemia -continue medications, check lipids, decrease fatty foods, increase activity.  -     Lipid panel  History of pulmonary embolus (PE) Add bASA  Solitary pulmonary nodule Follow up with pulmonary  Aortic root aneurysm (HCC) Control blood pressure, cholesterol, glucose, increase exercise.  Abnormal glucose Discussed general issues about diabetes pathophysiology and management., Educational material distributed., Suggested low cholesterol diet., Encouraged aerobic exercise., Discussed foot care., Reminded to get yearly retinal exam. -     Hemoglobin A1c  Vitamin D deficiency -     VITAMIN D 25 Hydroxy (Vit-D Deficiency, Fractures)  Medication management -     Magnesium  Generalized anxiety disorder  Need for hepatitis C screening test -     Hepatitis C antibody  Encounter for general adult medical examination with abnormal findings  Anemia,  unspecified type -     Iron and TIBC -     Vitamin B12  BPH associated with nocturia -     PSA   Discussed med's effects and SE's. Screening labs and tests as requested with regular follow-up as recommended. Over 40 minutes of exam, counseling, chart review and critical decision making was performed  HPI Patient presents for a complete physical.   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 He denies chest pain, shortness of breath, dizziness. He has COPD, has pulmonary nodule on CT chest in 2014 and aortic root dilatation, has not had CT since that time, has 70 pack smoking history, quit 7 years ago. He had a CT chest today with Dr. Jamison NeighborNestor, showed stable pulmonary nodules, had one new nodule RUL, will need repeat scan 6-12 months. Also has aneurysmal dilatation of ascending aorta, 4.5 cm and stable, will refer to vascular for evaluation. He is on the anoro.  He has anxiety, is on ativan has gone from 2mg  to 1mg  and is now taking 1-2 x a day, decreasing dose but declines daily anxiety medication, has been advised to follow up psych  He is on cholesterol medication, lipitor 40mg  daily and denies myalgias. His cholesterol is not at goal. The cholesterol last visit was:   Lab Results  Component Value Date   CHOL 183 02/27/2017   HDL 44 02/27/2017   LDLCALC 80 02/27/2017   TRIG 296 (H) 02/27/2017   CHOLHDL 4.2 02/27/2017   He is on  thyroid medication. His medication was not changed last visit.   Lab Results  Component Value Date   TSH 2.03 04/02/2017  .  Last A1C in the office was:  Lab Results  Component Value Date   HGBA1C 5.2 02/27/2017   Last GFR: Lab Results  Component Value Date   GFRNONAA 81 04/23/2017    Patient is on Vitamin D supplement.   Lab Results  Component Value Date   VD25OH 97 06/12/2016     Last PSA was: Lab  Results  Component Value Date   PSA 0.88 06/12/2016    Current Medications:  Current Outpatient Prescriptions on File Prior to Visit  Medication Sig Dispense Refill  . amLODipine-olmesartan (AZOR) 5-40 MG tablet Take 1 tablet by mouth daily. 90 tablet 1  . ANORO ELLIPTA 62.5-25 MCG/INH AEPB TAKE 1 PUFF BY MOUTH EVERY DAY 60 each 11  . atorvastatin (LIPITOR) 40 MG tablet TAKE ONE TABLET BY MOUTH ONCE DAILY 90 tablet 0  . beclomethasone (QVAR) 80 MCG/ACT inhaler 2 inhalations daily 1 Inhaler 12  . clobetasol ointment (TEMOVATE) 0.05 % Apply as needed    . Flaxseed, Linseed, (EQL FLAX SEED OIL) 1000 MG CAPS Take 1,000 mg by mouth daily.    . Fluticasone-Umeclidin-Vilant (TRELEGY ELLIPTA) 100-62.5-25 MCG/INH AEPB Inhale 1 puff into the lungs daily. 1 each 0  . HUMIRA PEN 40 MG/0.8ML PNKT     . levothyroxine (SYNTHROID, LEVOTHROID) 100 MCG tablet TAKE 1 TABLET (100 MCG TOTAL) BY MOUTH DAILY. 90 tablet 1  . LORazepam (ATIVAN) 1 MG tablet Take 1/2-1 tablet up to 3 times a day however we are cutting back on amount available, should NOT take 3 x a day 75 tablet 0  . meclizine (ANTIVERT) 25 MG tablet 1/2-1 pill up to 3 times daily for motion sickness/dizziness 60 tablet 0  . metoprolol tartrate (LOPRESSOR) 50 MG tablet Take 1 tablet (50 mg total) by mouth 2 (two) times daily. 180 tablet 0  . Multiple Vitamins-Minerals (MULTIVITAMIN WITH MINERALS) tablet Take 1 tablet by mouth daily.    . Omega-3 Fatty Acids (FISH OIL) 1000 MG CAPS Take 1,000 mg by mouth daily.    Marland Kitchen PROAIR HFA 108 (90 BASE) MCG/ACT inhaler INHALE 2 PUFFS INTO THE LUNGS EVERY 4 HOURS AS NEEDED FOR WHEEZING OR SHORTNESS OF BREATH. 18 g 3  . triamcinolone ointment (KENALOG) 0.1 % APPLY ONE OINTMENT TOPICALLY TWICE DAILY 80 g 1   No current facility-administered medications on file prior to visit.    Allergies:  Allergies  Allergen Reactions  . Ace Inhibitors     cough  . Haldol [Haloperidol Lactate]     psychosis  . Haloperidol  Lactate     REACTION: phsycotic break   Health Maintenance:  Immunization History  Administered Date(s) Administered  . Influenza Split 11/15/2012  . Influenza Whole 09/24/2010  . Pneumococcal Conjugate-13 05/31/2017  . Pneumococcal Polysaccharide-23 06/03/2010  . Tdap 06/12/2016   Tetanus: 2017 Pneumovax:2011 Prevnar 13: 2018 Flu vaccine: declines Zostavax:  DEXA: Colonoscopy: 2012 due 10 years EGD: PFT 2013 Ct chest 05/2017, repeat 6-12 months [place] AB 2013 US neck 2015 US carotid 2013  Eye Exam: glasses, Dr. Luciana Axe, years Dentist: None Patient Care Team: Lucky Cowboy, MD as PCP - General (Internal Medicine) Kalman Shan, MD as Consulting Physician (Pulmonary Disease) Elmon Else, MD as Consulting Physician (Dermatology) Luciana Axe Alford Highland, MD as Consulting Physician (Ophthalmology) Wendall Stade, MD as Consulting Physician (Cardiology) Sharrell Ku, MD as Consulting Physician (Gastroenterology) Jetty Duhamel  D, MD as Consulting Physician (Pulmonary Disease)  Medical History:  has Mixed hyperlipidemia; Essential hypertension; History of pulmonary embolus (PE); GERD; Solitary pulmonary nodule; Asthma with chronic obstructive pulmonary disease (COPD) (HCC); Psoriasis; CAD (coronary artery disease); Aortic root aneurysm (HCC); Abnormal glucose; Vitamin D deficiency; Medication management; Hypothyroidism; Generalized anxiety disorder; and Pulmonary emphysema (HCC) on his problem list. Surgical History:  He  has a past surgical history that includes Retinal detachment surgery; Video bronchoscopy (07/26/2012); and Thoracentesis. Family History:  His family history includes Allergies in his mother; Alzheimer's disease in his maternal aunt and mother; Asthma in his mother; Emphysema in his father. Social History:   reports that he quit smoking about 7 years ago. His smoking use included Cigarettes. He has a 70.00 pack-year smoking history. He has never used smokeless  tobacco. He reports that he drinks about 7.2 oz of alcohol per week . He reports that he does not use drugs.   Review of Systems:  Review of Systems  Constitutional: Negative for chills, diaphoresis and fever.  HENT: Negative for congestion, ear pain and sore throat.   Eyes: Negative for blurred vision and double vision.  Respiratory: Negative for cough, shortness of breath and wheezing.   Cardiovascular: Negative for chest pain, palpitations and leg swelling.  Gastrointestinal: Positive for heartburn. Negative for blood in stool, constipation, diarrhea and melena.  Genitourinary: Negative.   Neurological: Negative for headaches.    Physical Exam: Estimated body mass index is 30.64 kg/m as calculated from the following:   Height as of this encounter: 6\' 1"  (1.854 m).   Weight as of this encounter: 232 lb 3.2 oz (105.3 kg). BP 122/82   Pulse 67   Temp 97.3 F (36.3 C)   Resp 14   Ht 6\' 1"  (1.854 m)   Wt 232 lb 3.2 oz (105.3 kg)   SpO2 95%   BMI 30.64 kg/m  General Appearance: Well nourished, in no apparent distress.  Eyes: PERRLA, EOMs, conjunctiva no swelling or erythema, normal fundi and vessels.  Sinuses: No Frontal/maxillary tenderness  ENT/Mouth: Ext aud canals clear, normal light reflex with TMs without erythema, bulging. Good dentition. No erythema, swelling, or exudate on post pharynx. Tonsils not swollen or erythematous. Hearing normal.  Neck: Supple, thyroid normal. No bruits  Respiratory: Respiratory effort normal, BS equal bilaterally without rales, rhonchi, wheezing or stridor.  Cardio: RRR without murmurs, rubs or gallops. Brisk peripheral pulses without edema.  Chest: symmetric, with normal excursions and percussion.  Abdomen: Soft, nontender, no guarding, rebound, hernias, masses, or organomegaly.  Lymphatics: Non tender without lymphadenopathy.  Genitourinary: defer Musculoskeletal: Full ROM all peripheral extremities,5/5 strength, and normal gait.  Skin:  Warm, dry without rashes, lesions, ecchymosis. Neuro: Cranial nerves intact, reflexes equal bilaterally. Normal muscle tone, no cerebellar symptoms. Sensation intact.  Psych: Awake and oriented X 3, normal affect, Insight and Judgment appropriate.   EKG: WNL no changes. AORTA SCAN: defer  Quentin Mulling 10:14 AM [facility]

## 2017-06-20 NOTE — Telephone Encounter (Signed)
The form was received and is currently awaiting JN for completion.

## 2017-06-21 ENCOUNTER — Encounter: Payer: Self-pay | Admitting: Physician Assistant

## 2017-06-21 ENCOUNTER — Ambulatory Visit (INDEPENDENT_AMBULATORY_CARE_PROVIDER_SITE_OTHER)
Admission: RE | Admit: 2017-06-21 | Discharge: 2017-06-21 | Disposition: A | Discharge status: Home / Self Care | Payer: 59 | Source: Ambulatory Visit | Attending: Pulmonary Disease | Admitting: Pulmonary Disease

## 2017-06-21 ENCOUNTER — Ambulatory Visit (INDEPENDENT_AMBULATORY_CARE_PROVIDER_SITE_OTHER): Payer: 59 | Admitting: Physician Assistant

## 2017-06-21 VITALS — BP 122/82 | HR 67 | Temp 97.3°F | Resp 14 | Ht 73.0 in | Wt 232.2 lb

## 2017-06-21 DIAGNOSIS — R911 Solitary pulmonary nodule: Secondary | ICD-10-CM | POA: Diagnosis not present

## 2017-06-21 DIAGNOSIS — I719 Aortic aneurysm of unspecified site, without rupture: Secondary | ICD-10-CM

## 2017-06-21 DIAGNOSIS — L409 Psoriasis, unspecified: Secondary | ICD-10-CM

## 2017-06-21 DIAGNOSIS — N401 Enlarged prostate with lower urinary tract symptoms: Secondary | ICD-10-CM

## 2017-06-21 DIAGNOSIS — J439 Emphysema, unspecified: Secondary | ICD-10-CM

## 2017-06-21 DIAGNOSIS — E039 Hypothyroidism, unspecified: Secondary | ICD-10-CM

## 2017-06-21 DIAGNOSIS — Z1159 Encounter for screening for other viral diseases: Secondary | ICD-10-CM

## 2017-06-21 DIAGNOSIS — Z0000 Encounter for general adult medical examination without abnormal findings: Secondary | ICD-10-CM | POA: Diagnosis not present

## 2017-06-21 DIAGNOSIS — Z79899 Other long term (current) drug therapy: Secondary | ICD-10-CM

## 2017-06-21 DIAGNOSIS — E782 Mixed hyperlipidemia: Secondary | ICD-10-CM

## 2017-06-21 DIAGNOSIS — IMO0001 Reserved for inherently not codable concepts without codable children: Secondary | ICD-10-CM

## 2017-06-21 DIAGNOSIS — I2510 Atherosclerotic heart disease of native coronary artery without angina pectoris: Secondary | ICD-10-CM

## 2017-06-21 DIAGNOSIS — E559 Vitamin D deficiency, unspecified: Secondary | ICD-10-CM | POA: Diagnosis not present

## 2017-06-21 DIAGNOSIS — Z0001 Encounter for general adult medical examination with abnormal findings: Secondary | ICD-10-CM

## 2017-06-21 DIAGNOSIS — R351 Nocturia: Secondary | ICD-10-CM

## 2017-06-21 DIAGNOSIS — F411 Generalized anxiety disorder: Secondary | ICD-10-CM

## 2017-06-21 DIAGNOSIS — K219 Gastro-esophageal reflux disease without esophagitis: Secondary | ICD-10-CM

## 2017-06-21 DIAGNOSIS — R7309 Other abnormal glucose: Secondary | ICD-10-CM

## 2017-06-21 DIAGNOSIS — Z86711 Personal history of pulmonary embolism: Secondary | ICD-10-CM

## 2017-06-21 DIAGNOSIS — J449 Chronic obstructive pulmonary disease, unspecified: Secondary | ICD-10-CM

## 2017-06-21 DIAGNOSIS — I10 Essential (primary) hypertension: Secondary | ICD-10-CM

## 2017-06-21 DIAGNOSIS — D649 Anemia, unspecified: Secondary | ICD-10-CM

## 2017-06-21 DIAGNOSIS — I7121 Aneurysm of the ascending aorta, without rupture: Secondary | ICD-10-CM

## 2017-06-21 LAB — CBC WITH DIFFERENTIAL/PLATELET
Basophils Absolute: 85 {cells}/uL (ref 0–200)
Basophils Relative: 1 %
Eosinophils Absolute: 680 {cells}/uL — ABNORMAL HIGH (ref 15–500)
Eosinophils Relative: 8 %
HCT: 42.4 % (ref 38.5–50.0)
Hemoglobin: 14.1 g/dL (ref 13.2–17.1)
Lymphocytes Relative: 30 %
Lymphs Abs: 2550 {cells}/uL (ref 850–3900)
MCH: 31.5 pg (ref 27.0–33.0)
MCHC: 33.3 g/dL (ref 32.0–36.0)
MCV: 94.6 fL (ref 80.0–100.0)
MPV: 9.4 fL (ref 7.5–12.5)
Monocytes Absolute: 935 {cells}/uL (ref 200–950)
Monocytes Relative: 11 %
Neutro Abs: 4250 {cells}/uL (ref 1500–7800)
Neutrophils Relative %: 50 %
Platelets: 330 10*3/uL (ref 140–400)
RBC: 4.48 MIL/uL (ref 4.20–5.80)
RDW: 12.8 % (ref 11.0–15.0)
WBC: 8.5 10*3/uL (ref 3.8–10.8)

## 2017-06-21 LAB — HEPATITIS C ANTIBODY: HCV AB: NEGATIVE

## 2017-06-21 LAB — TSH: TSH: 3.05 m[IU]/L (ref 0.40–4.50)

## 2017-06-21 NOTE — Patient Instructions (Addendum)
Add ENTERIC COATED low dose 81 mg Aspirin daily OR can do every other day if you have easy bruising to protect your heart and head. As well as to reduce risk of Colon Cancer by 20 %, Skin Cancer by 26 % , Melanoma by 46% and Pancreatic cancer by 60%  Chronic Obstructive Pulmonary Disease Chronic obstructive pulmonary disease (COPD) is a common lung condition in which airflow from the lungs is limited. COPD is a general term that can be used to describe many different lung problems that limit airflow, including both chronic bronchitis and emphysema. If you have COPD, your lung function will probably never return to normal, but there are measures you can take to improve lung function and make yourself feel better. What are the causes?  Smoking (common).  Exposure to secondhand smoke.  Genetic problems.  Chronic inflammatory lung diseases or recurrent infections. What are the signs or symptoms?  Shortness of breath, especially with physical activity.  Deep, persistent (chronic) cough with a large amount of thick mucus.  Wheezing.  Rapid breaths (tachypnea).  Gray or bluish discoloration (cyanosis) of the skin, especially in your fingers, toes, or lips.  Fatigue.  Weight loss.  Frequent infections or episodes when breathing symptoms become much worse (exacerbations).  Chest tightness. How is this diagnosed? Your health care provider will take a medical history and perform a physical examination to diagnose COPD. Additional tests for COPD may include:  Lung (pulmonary) function tests.  Chest X-ray.  CT scan.  Blood tests.  How is this treated? Treatment for COPD may include:  Inhaler and nebulizer medicines. These help manage the symptoms of COPD and make your breathing more comfortable.  Supplemental oxygen. Supplemental oxygen is only helpful if you have a low oxygen level in your blood.  Exercise and physical activity. These are beneficial for nearly all people with  COPD.  Lung surgery or transplant.  Nutrition therapy to gain weight, if you are underweight.  Pulmonary rehabilitation. This may involve working with a team of health care providers and specialists, such as respiratory, occupational, and physical therapists.  Follow these instructions at home:  Take all medicines (inhaled or pills) as directed by your health care provider.  Avoid over-the-counter medicines or cough syrups that dry up your airway (such as antihistamines) and slow down the elimination of secretions unless instructed otherwise by your health care provider.  If you are a smoker, the most important thing that you can do is stop smoking. Continuing to smoke will cause further lung damage and breathing trouble. Ask your health care provider for help with quitting smoking. He or she can direct you to community resources or hospitals that provide support.  Avoid exposure to irritants such as smoke, chemicals, and fumes that aggravate your breathing.  Use oxygen therapy and pulmonary rehabilitation if directed by your health care provider. If you require home oxygen therapy, ask your health care provider whether you should purchase a pulse oximeter to measure your oxygen level at home.  Avoid contact with individuals who have a contagious illness.  Avoid extreme temperature and humidity changes.  Eat healthy foods. Eating smaller, more frequent meals and resting before meals may help you maintain your strength.  Stay active, but balance activity with periods of rest. Exercise and physical activity will help you maintain your ability to do things you want to do.  Preventing infection and hospitalization is very important when you have COPD. Make sure to receive all the vaccines your health care  provider recommends, especially the pneumococcal and influenza vaccines. Ask your health care provider whether you need a pneumonia vaccine.  Learn and use relaxation techniques to manage  stress.  Learn and use controlled breathing techniques as directed by your health care provider. Controlled breathing techniques include: 1. Pursed lip breathing. Start by breathing in (inhaling) through your nose for 1 second. Then, purse your lips as if you were going to whistle and breathe out (exhale) through the pursed lips for 2 seconds. 2. Diaphragmatic breathing. Start by putting one hand on your abdomen just above your waist. Inhale slowly through your nose. The hand on your abdomen should move out. Then purse your lips and exhale slowly. You should be able to feel the hand on your abdomen moving in as you exhale.  Learn and use controlled coughing to clear mucus from your lungs. Controlled coughing is a series of short, progressive coughs. The steps of controlled coughing are: 1. Lean your head slightly forward. 2. Breathe in deeply using diaphragmatic breathing. 3. Try to hold your breath for 3 seconds. 4. Keep your mouth slightly open while coughing twice. 5. Spit any mucus out into a tissue. 6. Rest and repeat the steps once or twice as needed. Contact a health care provider if:  You are coughing up more mucus than usual.  There is a change in the color or thickness of your mucus.  Your breathing is more labored than usual.  Your breathing is faster than usual. Get help right away if:  You have shortness of breath while you are resting.  You have shortness of breath that prevents you from: ? Being able to talk. ? Performing your usual physical activities.  You have chest pain lasting longer than 5 minutes.  Your skin color is more cyanotic than usual.  You measure low oxygen saturations for longer than 5 minutes with a pulse oximeter. This information is not intended to replace advice given to you by your health care provider. Make sure you discuss any questions you have with your health care provider. Document Released: 09/20/2005 Document Revised: 05/18/2016  Document Reviewed: 08/07/2013 Elsevier Interactive Patient Education  2017 [employer]

## 2017-06-22 LAB — MICROALBUMIN / CREATININE URINE RATIO
CREATININE, URINE: 112 mg/dL (ref 20–370)
MICROALB UR: 4.4 mg/dL
MICROALB/CREAT RATIO: 39 ug/mg{creat} — AB (ref <30)

## 2017-06-22 LAB — LIPID PANEL
CHOLESTEROL: 167 mg/dL (ref <200)
HDL: 41 mg/dL (ref >40)
LDL CALC: 92 mg/dL (ref <100)
TRIGLYCERIDES: 170 mg/dL — AB (ref <150)
Total CHOL/HDL Ratio: 4.1 Ratio (ref <5.0)
VLDL: 34 mg/dL — ABNORMAL HIGH (ref <30)

## 2017-06-22 LAB — VITAMIN D 25 HYDROXY (VIT D DEFICIENCY, FRACTURES): VIT D 25 HYDROXY: 75 ng/mL (ref 30–100)

## 2017-06-22 LAB — VITAMIN B12: Vitamin B-12: >2000 pg/mL — ABNORMAL HIGH (ref 200–1100)

## 2017-06-22 LAB — HEMOGLOBIN A1C
Hgb A1c MFr Bld: 5 % (ref <5.7)
Mean Plasma Glucose: 97 mg/dL

## 2017-06-22 LAB — BASIC METABOLIC PANEL WITH GFR
BUN: 28 mg/dL — ABNORMAL HIGH (ref 7–25)
CALCIUM: 10.1 mg/dL (ref 8.6–10.3)
CO2: 20 mmol/L (ref 20–31)
CREATININE: 1.02 mg/dL (ref 0.70–1.33)
Chloride: 104 mmol/L (ref 98–110)
GFR, EST NON AFRICAN AMERICAN: 81 mL/min (ref >=60)
GFR, Est African American: >89 mL/min (ref >=60)
Glucose, Bld: 90 mg/dL (ref 65–99)
Potassium: 4.9 mmol/L (ref 3.5–5.3)
SODIUM: 137 mmol/L (ref 135–146)

## 2017-06-22 LAB — HEPATIC FUNCTION PANEL
ALT: 21 U/L (ref 9–46)
AST: 19 U/L (ref 10–35)
Albumin: 4.4 g/dL (ref 3.6–5.1)
Alkaline Phosphatase: 78 U/L (ref 40–115)
BILIRUBIN DIRECT: 0.1 mg/dL (ref <=0.2)
BILIRUBIN TOTAL: 0.7 mg/dL (ref 0.2–1.2)
Indirect Bilirubin: 0.6 mg/dL (ref 0.2–1.2)
Total Protein: 7.3 g/dL (ref 6.1–8.1)

## 2017-06-22 LAB — PSA: PSA: 0.7 ng/mL (ref <=4.0)

## 2017-06-22 LAB — URINALYSIS, ROUTINE W REFLEX MICROSCOPIC
Bilirubin Urine: NEGATIVE
GLUCOSE, UA: NEGATIVE
Hgb urine dipstick: NEGATIVE
Ketones, ur: NEGATIVE
LEUKOCYTES UA: NEGATIVE
NITRITE: NEGATIVE
PROTEIN: NEGATIVE
Specific Gravity, Urine: 1.017 (ref 1.001–1.035)
pH: 6 (ref 5.0–8.0)

## 2017-06-22 LAB — MAGNESIUM: Magnesium: 2.1 mg/dL (ref 1.5–2.5)

## 2017-06-22 LAB — IRON AND TIBC
%SAT: 35 % (ref 15–60)
IRON: 108 ug/dL (ref 50–180)
TIBC: 312 ug/dL (ref 250–425)
UIBC: 204 ug/dL

## 2017-06-22 NOTE — Progress Notes (Signed)
LVM for pt to return office call for LAB results.

## 2017-06-25 ENCOUNTER — Telehealth: Payer: Self-pay | Admitting: Pulmonary Disease

## 2017-06-25 NOTE — Telephone Encounter (Deleted)
A prior authorization form was faxed to [employer] at [phone] for pantoprazole 40 mg. A confirmation fax sheet was received. Will await decision.  

## 2017-06-25 NOTE — Telephone Encounter (Signed)
Please see telephone note from 06/15/17.

## 2017-06-25 NOTE — Progress Notes (Signed)
Pt aware of lab results & voiced understanding of those results.

## 2017-06-25 NOTE — Telephone Encounter (Signed)
A prior authorization form was faxed to [employer] at [phone] for pantoprazole 40 mg. A confirmation fax sheet was received. Will await decision.

## 2017-06-25 NOTE — Telephone Encounter (Signed)
A previous phone message was created for this encounter.

## 2017-06-28 NOTE — Telephone Encounter (Signed)
Contacted [employer] for follow up on determination status. Per representative, the prior authorization department was on lunch. She will contact them after lunch for determination update and then contact the office for a verbal update and will then fax decision if it has been made.

## 2017-06-29 NOTE — Telephone Encounter (Signed)
PA has been denied.Caren GriffinsStanley A Dalton'

## 2017-07-05 NOTE — Progress Notes (Signed)
Left message for patient to contact office for medical results.

## 2017-07-05 NOTE — Telephone Encounter (Signed)
Patient is returning phone call [phone].

## 2017-07-09 ENCOUNTER — Other Ambulatory Visit: Payer: Self-pay | Admitting: Physician Assistant

## 2017-07-09 DIAGNOSIS — F411 Generalized anxiety disorder: Secondary | ICD-10-CM

## 2017-07-09 MED ORDER — LORAZEPAM 1 MG PO TABS: ORAL_TABLET | ORAL | 0 refills | Status: DC

## 2017-07-09 NOTE — Telephone Encounter (Signed)
[employer] [phone]) was contacted for follow up. A voicemail was received to leave message. Will try again at later time.

## 2017-07-09 NOTE — Progress Notes (Signed)
LORAZEPam was called into pharmacy on 16th July 2018 AT 4:50 PM BY DD.

## 2017-07-10 NOTE — Telephone Encounter (Signed)
[employer] [phone])was contacted and per Clelia Croftonna, Protonix is listed on the plan exclusion list and will not be covered; a formulary exception can not be created. Spoke with patient and made him aware of this information. He states he currently using OTC Prilosec which is helping control his GERD symptoms. Nothing further is needed.    Routing to JN to make him aware of this information.

## 2017-07-13 ENCOUNTER — Other Ambulatory Visit: Payer: Self-pay | Admitting: *Deleted

## 2017-07-13 ENCOUNTER — Ambulatory Visit (INDEPENDENT_AMBULATORY_CARE_PROVIDER_SITE_OTHER): Payer: 59 | Admitting: Pulmonary Disease

## 2017-07-13 ENCOUNTER — Ambulatory Visit: Payer: 59 | Admitting: Pulmonary Disease

## 2017-07-13 ENCOUNTER — Institutional Professional Consult (permissible substitution): Payer: PRIVATE HEALTH INSURANCE | Admitting: Pulmonary Disease

## 2017-07-13 DIAGNOSIS — J449 Chronic obstructive pulmonary disease, unspecified: Secondary | ICD-10-CM | POA: Diagnosis not present

## 2017-07-13 LAB — PULMONARY FUNCTION TEST
DL/VA % pred: 59 %
DL/VA: 2.87 ml/min/mmHg/L
DLCO UNC: 18.92 ml/min/mmHg
DLCO cor % pred: 50 %
DLCO cor: 18.47 ml/min/mmHg
DLCO unc % pred: 52 %
FEF 25-75 Post: 1.13 L/sec
FEF 25-75 Pre: 1.19 L/sec
FEF2575-%Change-Post: -4 %
FEF2575-%Pred-Post: 33 %
FEF2575-%Pred-Pre: 35 %
FEV1-%CHANGE-POST: 0 %
FEV1-%PRED-POST: 58 %
FEV1-%Pred-Pre: 59 %
FEV1-POST: 2.38 L
FEV1-PRE: 2.4 L
FEV1FVC-%Change-Post: 0 %
FEV1FVC-%Pred-Pre: 79 %
FEV6-%Change-Post: -1 %
FEV6-%PRED-POST: 75 %
FEV6-%Pred-Pre: 76 %
FEV6-POST: 3.86 L
FEV6-PRE: 3.92 L
FEV6FVC-%CHANGE-POST: 0 %
FEV6FVC-%PRED-POST: 104 %
FEV6FVC-%PRED-PRE: 103 %
FVC-%CHANGE-POST: 0 %
FVC-%Pred-Post: 73 %
FVC-%Pred-Pre: 74 %
FVC-Post: 3.93 L
FVC-Pre: 3.96 L
PRE FEV6/FVC RATIO: 99 %
Post FEV1/FVC ratio: 61 %
Post FEV6/FVC ratio: 100 %
Pre FEV1/FVC ratio: 61 %
RV % PRED: 121 %
RV: 2.87 L
TLC % PRED: 95 %
TLC: 7.23 L

## 2017-07-13 MED ORDER — FLUTICASONE-UMECLIDIN-VILANT 100-62.5-25 MCG/INH IN AEPB: 1.0000 | INHALATION_SPRAY | Freq: Every day | RESPIRATORY_TRACT | 5 refills | Status: DC

## 2017-07-13 NOTE — Progress Notes (Signed)
PFT done today. 

## 2017-07-18 ENCOUNTER — Telehealth: Payer: Self-pay | Admitting: Pulmonary Disease

## 2017-07-18 NOTE — Telephone Encounter (Signed)
Please let the patient know that his pulmonary function testing shows moderate-severe COPD that is somewhat worse since his previous testing in 2013. We will review this at his follow-up appointment. Thank you.

## 2017-07-18 NOTE — Telephone Encounter (Signed)
Pt is requesting results from his PFT done on 07/13/2017.  JN - please advise. Thanks.

## 2017-07-18 NOTE — Telephone Encounter (Signed)
Spoke with patient. He is aware of results. Nothing else needed at time of call.  

## 2017-07-23 ENCOUNTER — Other Ambulatory Visit: Payer: Self-pay | Admitting: Internal Medicine

## 2017-07-23 ENCOUNTER — Other Ambulatory Visit: Payer: Self-pay | Admitting: Physician Assistant

## 2017-07-23 DIAGNOSIS — I10 Essential (primary) hypertension: Secondary | ICD-10-CM

## 2017-08-06 ENCOUNTER — Other Ambulatory Visit: Payer: Self-pay | Admitting: Physician Assistant

## 2017-08-06 DIAGNOSIS — F411 Generalized anxiety disorder: Secondary | ICD-10-CM

## 2017-08-06 MED ORDER — LORAZEPAM 1 MG PO TABS: ORAL_TABLET | ORAL | 0 refills | Status: DC

## 2017-08-06 NOTE — Progress Notes (Signed)
Ativan was called into pharmacy on 13th Aug 2018 at 3:48pm by DD

## 2017-08-30 ENCOUNTER — Encounter: Payer: Self-pay | Admitting: Physician Assistant

## 2017-08-31 ENCOUNTER — Ambulatory Visit: Payer: 59 | Admitting: Pulmonary Disease

## 2017-09-03 ENCOUNTER — Other Ambulatory Visit: Payer: Self-pay | Admitting: Physician Assistant

## 2017-09-03 DIAGNOSIS — F411 Generalized anxiety disorder: Secondary | ICD-10-CM

## 2017-09-03 MED ORDER — LORAZEPAM 1 MG PO TABS: ORAL_TABLET | ORAL | 0 refills | Status: DC

## 2017-09-04 NOTE — Progress Notes (Signed)
Lorazepam was called into pharmacy on 11th sept 2018 by dd

## 2017-09-10 ENCOUNTER — Encounter: Payer: Self-pay | Admitting: Pulmonary Disease

## 2017-09-10 ENCOUNTER — Ambulatory Visit (INDEPENDENT_AMBULATORY_CARE_PROVIDER_SITE_OTHER): Payer: 59 | Admitting: Pulmonary Disease

## 2017-09-10 VITALS — BP 130/70 | HR 72 | Ht 71.0 in | Wt 234.1 lb

## 2017-09-10 DIAGNOSIS — R918 Other nonspecific abnormal finding of lung field: Secondary | ICD-10-CM | POA: Diagnosis not present

## 2017-09-10 DIAGNOSIS — K219 Gastro-esophageal reflux disease without esophagitis: Secondary | ICD-10-CM

## 2017-09-10 DIAGNOSIS — R002 Palpitations: Secondary | ICD-10-CM

## 2017-09-10 DIAGNOSIS — J439 Emphysema, unspecified: Secondary | ICD-10-CM | POA: Diagnosis not present

## 2017-09-10 NOTE — Patient Instructions (Addendum)
   Continue using the Trelegy inhaler as prescribed.  Try taking the Ranitidine (also called Zantac) one pill twice daily. Take the  pills.   You can try using Prilosec (also called Omeprazole)  over the counter for 1-2 weeks with it.  We will contact you with your heart monitor results once they are available.  We will see you back in 3 months or sooner if needed.   TESTS ORDERED: 1. CT CHEST W/O December 2018 2. 48 hour heart monitor 3. on room air

## 2017-09-10 NOTE — Progress Notes (Signed)
Subjective:    Patient ID: Samuel Sharp, male    DOB: March 21, 1961, 56 y.o.   MRN: [id number]  C.C.:  Follow-up for Moderate-Severe COPD w/ Emphysema, Multiple Lung Nodules, & GERD.   HPI Moderate-Severe COPD w/ Emphysema: Transitioned to Trelegy inhaler at last appointment. He reports his baseline dyspnea. He does report a frequent cough producing a clear mucus. No wheezing. He reports he is adherent to the Trelegy inhaler. He uses his rescue inhaler 3-4 times a week at most without any perceived symptomatic benefit.  Multiple Lung Nodules: Nodular opacity within right lower lobe as well as 6 mm nodule identified in 2014. Opacification is decreased in size as a most recent CT imaging in June but the patient now has subcentimeter nodules within right upper lobe that have appeared as new compared with previous CT imaging.  GERD:  Protonix  daily added to patient's regimen of Zantac at last appointment. He reports he never took the Protonix due to the cost. He is still having intermittent reflux but denies any reflux into the back of his throat. He is taking  of Zantac at night as well as one additional dose during the day.  Review of Systems No chest pain, pressure, or tightness. Does have frequent heart palpitations. No syncope or near syncope. No abdominal pain, nausea, or emesis.   Allergies  Allergen Reactions  . Ace Inhibitors     cough  . Haldol [Haloperidol Lactate]     psychosis  . Haloperidol Lactate     REACTION: phsycotic break    Current Outpatient Prescriptions on File Prior to Visit  Medication Sig Dispense Refill  . amLODipine-olmesartan (AZOR) 5-40 MG tablet Take 1 tablet by mouth daily. 90 tablet 1  . atorvastatin (LIPITOR) 40 MG tablet TAKE 1 TABLET BY MOUTH ONCE DAILY 90 tablet 0  . clobetasol ointment (TEMOVATE) 0.05 % Apply as needed    . Flaxseed, Linseed, (EQL FLAX SEED OIL) 1000 MG CAPS Take 1,000 mg by mouth daily.    . Fluticasone-Umeclidin-Vilant  (TRELEGY ELLIPTA) 100-62.5-25 MCG/INH AEPB Inhale 1 puff into the lungs daily. 60 each 5  . HUMIRA PEN 40 MG/0.8ML PNKT     . levothyroxine (SYNTHROID, LEVOTHROID) 100 MCG tablet TAKE 1 TABLET (100 MCG TOTAL) BY MOUTH DAILY. 90 tablet 1  . meclizine (ANTIVERT) 25 MG tablet 1/2-1 pill up to 3 times daily for motion sickness/dizziness 60 tablet 0  . metoprolol tartrate (LOPRESSOR) 50 MG tablet TAKE 1 TABLET BY MOUTH TWICE DAILY 180 tablet 0  . Multiple Vitamins-Minerals (MULTIVITAMIN WITH MINERALS) tablet Take 1 tablet by mouth daily.    . Omega-3 Fatty Acids (FISH OIL) 1000 MG CAPS Take 1,000 mg by mouth daily.    Marland Kitchen PROAIR HFA 108 (90 BASE) MCG/ACT inhaler INHALE 2 PUFFS INTO THE LUNGS EVERY 4 HOURS AS NEEDED FOR WHEEZING OR SHORTNESS OF BREATH. 18 g 3  . triamcinolone ointment (KENALOG) 0.1 % APPLY ONE OINTMENT TOPICALLY TWICE DAILY 80 g 1   No current facility-administered medications on file prior to visit.     Past Medical History:  Diagnosis Date  . Asthma    Present all life. Dx as child. Been in [facility] since 2003--pft 06/03/10 FEV1 3.14/ 82%, FEV1/FVC 0.54; insig resp to BD, DLCO 75%, ni lUNG  Vol  . COPD (chronic obstructive pulmonary disease) (HCC)   . Detached retina   . History of TB skin testing    negative 2005-2010  . Hyperlipidemia   . Hypertension  on lisinopril 2002/2003--changed ARB march 2011  . Lung nodule    5mm on ct 11/10/10- likely lymph node  . Palpitations    started atenolol since 2003. recollects hx of holter. 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 informed it "Not a dangerous condition"-changed to lopressor 12/15/2010  . Psoriasis    on enbre;l since 005. started in [place]. Stopped 2011 by [place] derm due to nodule on chest  . Pulmonary embolism (HCC)    does not recollect tpA or being on lot of o2 or hypotension. 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 RETINAL  DETACHMENT SURGERY    . THORACENTESIS    . VIDEO BRONCHOSCOPY  07/26/2012   Procedure: VIDEO BRONCHOSCOPY WITH FLUORO;  Surgeon: Alyson Reedy, MD;  Location: [facility] ENDOSCOPY;  Service: Cardiopulmonary;  Laterality: Bilateral;    Family History  Problem Relation Age of Onset  . Emphysema Father   . Asthma Mother   . Allergies Mother   . Alzheimer's disease Mother   . Alzheimer's disease Maternal Aunt     Social History   Social History  . Marital status: Single    Spouse name: N/A  . Number of children: N/A  . Years of education: N/A   Social History Main Topics  . Smoking status: Former Smoker    Packs/day: 2.00    Years: 35.00    Types: Cigarettes    Quit date: 03/25/2010  . Smokeless tobacco: Never Used  . Alcohol use 7.2 oz/week    12 Standard drinks or equivalent per week     Comment: every other day, 4-5drinks  . Drug use: No  . Sexual activity: No   Other Topics Concern  . None   Social History Narrative   [facility] (05/31/17):   Originally from [place]. Moved to [place] in 2006. Has also lived in [place]. No history of [place] Fever. No international travel. No pets currently. No bird, mold, or recent hot tub exposure. Had a hot tub 5 years ago. 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       Objective:   Physical Exam BP 130/70 (BP Location: Right Arm, Cuff Size: Normal)   Pulse 72   Ht  (1.803 m)   Wt 234 lb 2 oz (106.2 kg)   SpO2 94%   BMI 32.65 kg/m   General:  Awake. No distress. Mild central obesity.  Integument:  Warm. Dry. No rash on exposed skin. Extremities:  No cyanosis or clubbing.  Lymphatics: No appreciated cervical or supraclavicular lymphadenopathy. HEENT:  No scleral icterus. Moist mucous membranes. No oral ulcers. Cardiovascular:  Regular rate. No edema. Regular rhythm.  Pulmonary:  Clear with auscultation bilaterally. Normal work of breathing on room air. Abdomen:  Soft. Normal bowel sounds. Protuberant. Musculoskeletal:  Normal bulk and tone. No joint deformity or effusion appreciated.  PFT 07/13/17: FVC 3.96 L (74%) FEV1 2.40 L (59%) FEV1/FVC 0.61 FEF 25-75 1.19 L (35%) negative bronchodilator response TLC 7.23 L (95%) RV 121% ERV 32% DLCO corrected 50% 11/15/12: FVC 4.80 L (92%) FEV1 2.48 L (61%) FEV1/FVC 0.52 FEF 25-75 0.85 L (22%) 06/03/10: FVC 5.44 L (103%) FEV1 3.18 L (83%) FEV1/FVC 0.58 FEF 25-75 1.27 L (35%) negative bronchodilator response TLC 8.40 L (112%) RV 106% ERV 83% DLCO uncorrected 75%  IMAGING  CT CHEST W/O 06/01/17 (personally reviewed by me):  6 mm nodule within right lower lobe is stable. The nodular pleural-based opacity within the medial right lower lobe has decreased in size. New subcentimeter nodules are noted within the right upper lobe measuring up to 6 mm in largest dimension. These were not obviously present on previous CT imaging in 2014. Pleural thickening on the right is noted. No pleural effusion is appreciated. No pathologic mediastinal adenopathy. Apical predominant emphysema is noted. No pericardial effusion. 4.5 cm dilation of ascending aorta compared with 4.1 cm dilation in 2013.  CXR PA/LAT 07/07/14 (previously reviewed by me): Questionable hazy opacity right lung base. No new focal opacity. No pleural effusion. Heart normal in size & mediastinum normal in contour.  CT CHEST W/O 12/30/12 (previously reviewed by me):  Apical predominant emphysematous changes. Minimal linear opacity in right lung and at the base of the left lung consistent with scar tissue formation. Pleural thickening in the base of the right lung. 5 mm groundglass nodule in the superior right lower lobe. No pathologic mediastinal adenopathy. No pleural effusion. No pericardial effusion.  R PFA (07/23/12): 110cc Tubid, Yellow Total protein: 5.2 LDH: 1404 WBC: 1672 (neutrophils 63%, lymphs 20%, eos 12%, monocytes 5%) Cytology: No malignancy Routine culture:  Negative  MICROBIOLOGY Bronchial Wash RUL (07/26/12):  AFB negative / Candida albicans / Normal Oral Flora Sputum Culture (07/23/12):  Normal Oral Flora  PATHOLOGY RUL WASH & BRUSH (07/26/12):  Negative for malignancy. RUL Transbronchial Biopsy (07/26/12):  No malignancy. Interstitial fibrosis noted.  LABS 05/25/11 Alpha-1 antitrypsin: MM    Assessment & Plan:  56 y.o. male with moderate-severe COPD with emphysema. Symptomatically the patient is well controlled without a significant bronchodilator response based on his pulmonary function testing noted above. I reviewed his chest CT scan with him which does show subcentimeter nodules within his right upper lobe and given his prior history of tobacco use these were continued monitoring. His reflux continues to be intermittent and suboptimally controlled. Additionally, he is having frequent heart palpitations which could represent an underlying arrhythmia that could be contributing to his symptoms. I instructed the patient to contact my office if he had any new breathing problems or questions before his next appointment.  1. Moderate-severe COPD with emphysema:  Continuing patient on Trelegy inhaler. Checking 6 minute walk test on room air at next appointment. 2. Multiple lung nodules:  Repeat CT chest without contrast in December 2018. 3. GERD:  Recommended trying Zantac 180 mg by mouth twice a day with or without daily Prilosec for 7-14 days. 4. Heart palpitations: Checking 48 hour Holter monitor. 5. Ascending aorta dilation: Reviewed imaging with patient today. Continuing to monitor for signs of progression. 6. Health maintenance: Status post Prevnar vaccine June 2018, Pneumovax June 2011 & Tdap June 2017. Declines Flu vaccine.  7. Follow-up: Return to clinic in 3 months or sooner if needed.   Donna Christen Jamison Neighbor, M.D. [facility] Pulmonary [facility] Critical [facility] Pager:  [phone] After 3pm or if no response, call [phone] 3:58 PM 09/10/17

## 2017-09-27 ENCOUNTER — Ambulatory Visit (INDEPENDENT_AMBULATORY_CARE_PROVIDER_SITE_OTHER): Payer: 59

## 2017-09-27 DIAGNOSIS — R002 Palpitations: Secondary | ICD-10-CM | POA: Diagnosis not present

## 2017-10-03 ENCOUNTER — Other Ambulatory Visit: Payer: Self-pay | Admitting: Physician Assistant

## 2017-10-03 DIAGNOSIS — F411 Generalized anxiety disorder: Secondary | ICD-10-CM

## 2017-10-03 MED ORDER — LORAZEPAM 1 MG PO TABS: ORAL_TABLET | ORAL | 0 refills | Status: DC

## 2017-10-03 NOTE — Progress Notes (Signed)
loazepam has been called into the pharmacy on 10.10.18 by DD

## 2017-10-04 ENCOUNTER — Telehealth: Payer: Self-pay | Admitting: Pulmonary Disease

## 2017-10-04 NOTE — Telephone Encounter (Signed)
Notes recorded by Roslynn Amble, MD on 10/03/2017 at 6:05 PM EDT Please let the patient know that his heart monitor showed intermittent premature beats. There was nothing else immediately concerning seen by the cardiologist when he reviewed it. I would recommend that he further address this issue with his primary care physician. Thank you.  Advised pt of results. Pt understood and nothing further is needed.

## 2017-10-08 ENCOUNTER — Other Ambulatory Visit: Payer: Self-pay | Admitting: Internal Medicine

## 2017-10-17 ENCOUNTER — Other Ambulatory Visit: Payer: Self-pay | Admitting: Internal Medicine

## 2017-10-17 ENCOUNTER — Other Ambulatory Visit: Payer: Self-pay | Admitting: Physician Assistant

## 2017-10-17 DIAGNOSIS — I10 Essential (primary) hypertension: Secondary | ICD-10-CM

## 2017-10-22 ENCOUNTER — Encounter: Payer: Self-pay | Admitting: Physician Assistant

## 2017-10-22 ENCOUNTER — Ambulatory Visit (INDEPENDENT_AMBULATORY_CARE_PROVIDER_SITE_OTHER): Payer: 59 | Admitting: Physician Assistant

## 2017-10-22 VITALS — BP 128/82 | HR 77 | Temp 97.5°F | Ht 71.0 in | Wt 234.2 lb

## 2017-10-22 DIAGNOSIS — F411 Generalized anxiety disorder: Secondary | ICD-10-CM | POA: Diagnosis not present

## 2017-10-22 DIAGNOSIS — E039 Hypothyroidism, unspecified: Secondary | ICD-10-CM

## 2017-10-22 DIAGNOSIS — E782 Mixed hyperlipidemia: Secondary | ICD-10-CM | POA: Diagnosis not present

## 2017-10-22 DIAGNOSIS — Z79899 Other long term (current) drug therapy: Secondary | ICD-10-CM | POA: Diagnosis not present

## 2017-10-22 DIAGNOSIS — I10 Essential (primary) hypertension: Secondary | ICD-10-CM | POA: Diagnosis not present

## 2017-10-22 DIAGNOSIS — R002 Palpitations: Secondary | ICD-10-CM

## 2017-10-22 DIAGNOSIS — J449 Chronic obstructive pulmonary disease, unspecified: Secondary | ICD-10-CM

## 2017-10-22 DIAGNOSIS — J439 Emphysema, unspecified: Secondary | ICD-10-CM | POA: Diagnosis not present

## 2017-10-22 LAB — BASIC METABOLIC PANEL WITH GFR
BUN/Creatinine Ratio: 25 (calc) — ABNORMAL HIGH (ref 6–22)
BUN: 29 mg/dL — ABNORMAL HIGH (ref 7–25)
CALCIUM: 9.6 mg/dL (ref 8.6–10.3)
CO2: 25 mmol/L (ref 20–32)
Chloride: 103 mmol/L (ref 98–110)
Creat: 1.16 mg/dL (ref 0.70–1.33)
GFR, EST NON AFRICAN AMERICAN: 69 mL/min/{1.73_m2} (ref >=60)
GFR, Est African American: 80 mL/min/{1.73_m2} (ref >=60)
GLUCOSE: 80 mg/dL (ref 65–99)
POTASSIUM: 4.6 mmol/L (ref 3.5–5.3)
SODIUM: 137 mmol/L (ref 135–146)

## 2017-10-22 LAB — CBC WITH DIFFERENTIAL/PLATELET
Basophils Absolute: 71 cells/uL (ref 0–200)
Basophils Relative: 0.7 %
EOS PCT: 4.4 %
Eosinophils Absolute: 449 cells/uL (ref 15–500)
HEMATOCRIT: 37.8 % — AB (ref 38.5–50.0)
HEMOGLOBIN: 13.4 g/dL (ref 13.2–17.1)
LYMPHS ABS: 2285 {cells}/uL (ref 850–3900)
MCH: 32.8 pg (ref 27.0–33.0)
MCHC: 35.4 g/dL (ref 32.0–36.0)
MCV: 92.4 fL (ref 80.0–100.0)
MONOS PCT: 10.7 %
MPV: 9.9 fL (ref 7.5–12.5)
NEUTROS ABS: 6304 {cells}/uL (ref 1500–7800)
NEUTROS PCT: 61.8 %
Platelets: 284 10*3/uL (ref 140–400)
RBC: 4.09 10*6/uL — AB (ref 4.20–5.80)
RDW: 12.1 % (ref 11.0–15.0)
Total Lymphocyte: 22.4 %
WBC mixed population: 1091 cells/uL — ABNORMAL HIGH (ref 200–950)
WBC: 10.2 10*3/uL (ref 3.8–10.8)

## 2017-10-22 LAB — HEPATIC FUNCTION PANEL
AG Ratio: 1.7 (calc) (ref 1.0–2.5)
ALBUMIN MSPROF: 4.3 g/dL (ref 3.6–5.1)
ALKALINE PHOSPHATASE (APISO): 75 U/L (ref 40–115)
ALT: 21 U/L (ref 9–46)
AST: 18 U/L (ref 10–35)
BILIRUBIN TOTAL: 0.6 mg/dL (ref 0.2–1.2)
Bilirubin, Direct: 0.1 mg/dL (ref 0.0–0.2)
Globulin: 2.5 g/dL (calc) (ref 1.9–3.7)
Indirect Bilirubin: 0.5 mg/dL (calc) (ref 0.2–1.2)
TOTAL PROTEIN: 6.8 g/dL (ref 6.1–8.1)

## 2017-10-22 LAB — LIPID PANEL
CHOLESTEROL: 180 mg/dL (ref <200)
HDL: 47 mg/dL (ref >40)
LDL Cholesterol (Calc): 92 mg/dL (calc)
NON-HDL CHOLESTEROL (CALC): 133 mg/dL — AB (ref <130)
TRIGLYCERIDES: 288 mg/dL — AB (ref <150)
Total CHOL/HDL Ratio: 3.8 (calc) (ref <5.0)

## 2017-10-22 LAB — TSH: TSH: 5.12 m[IU]/L — AB (ref 0.40–4.50)

## 2017-10-22 LAB — MAGNESIUM: Magnesium: 2 mg/dL (ref 1.5–2.5)

## 2017-10-22 MED ORDER — MONTELUKAST SODIUM 10 MG PO TABS: 10.0000 mg | ORAL_TABLET | Freq: Every day | ORAL | 2 refills | Status: DC

## 2017-10-22 MED ORDER — VENLAFAXINE HCL ER 37.5 MG PO CP24: 37.5000 mg | ORAL_CAPSULE | Freq: Every day | ORAL | 2 refills | Status: DC

## 2017-10-22 NOTE — Progress Notes (Signed)
Patient ID: Samuel Sharp, male   DOB: August 02, 1961, 56 y.o.   MRN: [id number]  Assessment and Plan:  Hypertension:  -Continue medication, azor samples given -monitor blood pressure at home.  -Continue DASH diet.   -Reminder to go to the ER if any CP, SOB, nausea, dizziness, severe HA, changes vision/speech, left arm numbness and tingling, and jaw pain.  Cholesterol: -Continue diet and exercise.  -Check cholesterol   Hypothyroidism -check TSH level, continue medications the same, reminded to take on an empty stomach 30-32mins before food.   Obesity with co morbidities - long discussion about weight loss, diet, and exercise  COPD -currently well controlled  -trulicity continue it ? Allergy component, [place], try singulair  Anxiety Ativan 1mg  BID no more than than at a time, not taking daily, will add on effexor to see if we can try to decrease ativan use, intolerant to zoloft  Palpitations  check labs, r/u anemia, TSH, dehydration, electrolytes imbalance  continue BB, if worse will go to ER Increase water, decrease alcohol/caffeine, valsalva maneuvers taught   Continue diet and meds as discussed. Further disposition pending results of labs. Future Appointments Date Time Provider Department Center  06/25/2018 10:00 AM Quentin Mulling, PA-C [id number] None    HPI 56 y.o. male  presents for 3 month follow up with hypertension, hyperlipidemia, prediabetes and vitamin D.   His blood pressure has been controlled at home, today their BP is BP: 128/82.   He does workout. He denies chest pain, shortness of breath, dizziness.   He has asthma/COPD, he is on anoro and Qvar, will use albuterol 2 x daily with allergies. He has been having heart palpitations, wore heart monitor x 2 days, showed PVC's only. Has had increasing anxiety and less sleep.    He states that he was backed down from 2mg  to 1 mg TID, he is interested in slowly backing off after the holidays and continuing  this, not interested in SSRI/SNRI at this time tried zoloft several months back but had palpitations with it.   He is on thyroid medication. His medication was changed last visit however .   Lab Results  Component Value Date   TSH 3.05 06/21/2017    He is on cholesterol medication and denies myalgias. His cholesterol is at goal. The cholesterol last visit was:   Lab Results  Component Value Date   CHOL 167 06/21/2017   HDL 41 06/21/2017   LDLCALC 92 06/21/2017   TRIG 170 (H) 06/21/2017   CHOLHDL 4.1 06/21/2017    He has been working on diet and exercise for prediabetes, and denies foot ulcerations, hyperglycemia, hypoglycemia , increased appetite, nausea, paresthesia of the feet, polydipsia, polyuria, visual disturbances, vomiting and weight loss. Last A1C in the office was:  Lab Results  Component Value Date   HGBA1C 5.0 06/21/2017   Patient is on Vitamin D supplement.  Lab Results  Component Value Date   VD25OH 75 06/21/2017     BMI is Body mass index is 32.66 kg/m., he is working on diet and exercise. Wt Readings from Last 3 Encounters:  10/22/17 234 lb 3.2 oz (106.2 kg)  09/10/17 234 lb 2 oz (106.2 kg)  06/21/17 232 lb 3.2 oz (105.3 kg)   Current Medications:  Current Outpatient Prescriptions on File Prior to Visit  Medication Sig Dispense Refill  . amLODipine-olmesartan (AZOR) 5-40 MG tablet Take 1 tablet by mouth daily. 90 tablet 1  . atorvastatin (LIPITOR) 40 MG tablet TAKE 1 TABLET  BY MOUTH ONCE DAILY 90 tablet 0  . clobetasol ointment (TEMOVATE) 0.05 % Apply as needed    . Flaxseed, Linseed, (EQL FLAX SEED OIL) 1000 MG CAPS Take 1,000 mg by mouth daily.    . Fluticasone-Umeclidin-Vilant (TRELEGY ELLIPTA) 100-62.5-25 MCG/INH AEPB Inhale 1 puff into the lungs daily. 60 each 5  . HUMIRA PEN 40 MG/0.8ML PNKT     . levothyroxine (SYNTHROID, LEVOTHROID) 100 MCG tablet TAKE 1 TABLET (100 MCG TOTAL) BY MOUTH DAILY. 90 tablet 1  . LORazepam (ATIVAN) 1 MG tablet Take 1/2-1  tablet up to 3 times a day however we are cutting back on amount available, should NOT take 3 x a day 60 tablet 0  . metoprolol tartrate (LOPRESSOR) 50 MG tablet TAKE 1 TABLET BY MOUTH TWICE DAILY 180 tablet 0  . Multiple Vitamins-Minerals (MULTIVITAMIN WITH MINERALS) tablet Take 1 tablet by mouth daily.    . Omega-3 Fatty Acids (FISH OIL) 1000 MG CAPS Take 1,000 mg by mouth daily.    Marland Kitchen PROAIR HFA 108 (90 BASE) MCG/ACT inhaler INHALE 2 PUFFS INTO THE LUNGS EVERY 4 HOURS AS NEEDED FOR WHEEZING OR SHORTNESS OF BREATH. 18 g 3  . triamcinolone ointment (KENALOG) 0.1 % APPLY ONE OINTMENT TOPICALLY TWICE DAILY 80 g 1  . meclizine (ANTIVERT) 25 MG tablet 1/2-1 pill up to 3 times daily for motion sickness/dizziness (Patient not taking: Reported on 10/22/2017) 60 tablet 0   No current facility-administered medications on file prior to visit.     Medical History:  Past Medical History:  Diagnosis Date  . Asthma    Present all life. Dx as child. Been in [facility] since 2003--pft 06/03/10 FEV1 3.14/ 82%, FEV1/FVC 0.54; insig resp to BD, DLCO 75%, ni lUNG  Vol  . COPD (chronic obstructive pulmonary disease) (HCC)   . Detached retina   . History of TB skin testing    negative 2005-2010  . Hyperlipidemia   . Hypertension    on lisinopril 2002/2003--changed ARB march 2011  . Lung nodule    5mm on ct 11/10/10- likely lymph node  . Palpitations    started atenolol since 2003. recollects hx of holter. 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 informed it "Not a dangerous condition"-changed to lopressor 12/15/2010  . Psoriasis    on enbre;l since 005. started in [place]. Stopped 2011 by [place] derm due to nodule on chest  . Pulmonary embolism (HCC)    does not recollect tpA or being on lot of o2 or hypotension. 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 Ace Inhibitors     cough  . Haldol [Haloperidol Lactate]     psychosis  .  Haloperidol Lactate     REACTION: phsycotic break     Review of Systems:  Review of Systems  Constitutional: Negative for chills, diaphoresis and fever.  HENT: Negative for congestion, ear pain and sore throat.   Eyes: Negative for blurred vision and double vision.  Respiratory: Negative for cough, shortness of breath and wheezing.   Cardiovascular: Positive for palpitations. Negative for chest pain and leg swelling.  Gastrointestinal: Positive for heartburn. Negative for blood in stool, constipation, diarrhea and melena.  Genitourinary: Negative.   Neurological: Negative for headaches.    Family history- Review and unchanged  Social history- Review and unchanged  Physical Exam: BP 128/82   Pulse 77   Temp (!) 97.5 F (36.4 C)  Ht 5\' 11"  (1.803 m)   Wt 234 lb 3.2 oz (106.2 kg)   SpO2 97%   BMI 32.66 kg/m  Wt Readings from Last 3 Encounters:  10/22/17 234 lb 3.2 oz (106.2 kg)  09/10/17 234 lb 2 oz (106.2 kg)  06/21/17 232 lb 3.2 oz (105.3 kg)    General Appearance: Well nourished well developed, in no apparent distress. Eyes: PERRLA, EOMs, conjunctiva no swelling or erythema ENT/Mouth: Ear canals normal without obstruction, swelling, erythma, discharge.  TMs normal bilaterally.  Oropharynx moist, clear, without exudate, or postoropharyngeal swelling. Neck: Supple, thyroid normal,no cervical adenopathy  Respiratory: Respiratory effort normal, Breath sounds clear A&P without rhonchi, wheeze, or rale.  No retractions, no accessory usage. Cardio: RRR with frequent PVC's, no MRGs. Brisk peripheral pulses without edema.  Abdomen: Soft, + BS,  Non tender, no guarding, rebound, hernias, masses. Musculoskeletal: Full ROM, 5/5 strength, Normal gait Skin: Warm, dry without rashes, lesions, ecchymosis.  Neuro: Awake and oriented X 3, Cranial nerves intact. Normal muscle tone, no cerebellar symptoms. Psych: Normal affect, Insight and Judgment appropriate.    Quentin MullingAmanda Kristina Bertone,  PA-C 4:18 PM [facility],

## 2017-10-22 NOTE — Patient Instructions (Signed)
Try the effexor 37.5 mg once a day for palpitations and anxiety Try the singulair for allergies/asthma x 1 month WITH trilegy if it does not help after 1-2 months stop it   Palpitations A palpitation is the feeling that your heartbeat is irregular or is faster than normal. It may feel like your heart is fluttering or skipping a beat. Palpitations are usually not a serious problem. They may be caused by many things, including smoking, caffeine, alcohol, stress, and certain medicines. Although most causes of palpitations are not serious, palpitations can be a sign of a serious medical problem. In some cases, you may need further medical evaluation. Follow these instructions at home: Pay attention to any changes in your symptoms. Take these actions to help with your condition:  Avoid the following: ? Caffeinated coffee, tea, soft drinks, diet pills, and energy drinks. ? Chocolate. ? Alcohol.  Do not use any tobacco products, such as cigarettes, chewing tobacco, and e-cigarettes. If you need help quitting, ask your health care provider.  Try to reduce your stress and anxiety. Things that can help you relax include: ? Yoga. ? Meditation. ? Physical activity, such as swimming, jogging, or walking. ? Biofeedback. This is a method that helps you learn to use your mind to control things in your body, such as your heartbeats.  Get plenty of rest and sleep.  Take over-the-counter and prescription medicines only as told by your health care provider.  Keep all follow-up visits as told by your health care provider. This is important.  Contact a health care provider if:  You continue to have a fast or irregular heartbeat after 24 hours.  Your palpitations occur more often. Get help right away if:  You have chest pain or shortness of breath.  You have a severe headache.  You feel dizzy or you faint. This information is not intended to replace advice given to you by your health care provider.  Make sure you discuss any questions you have with your health care provider. Document Released: 12/08/2000 Document Revised: 05/15/2016 Document Reviewed: 08/26/2015 Elsevier Interactive Patient Education  2017 [employer]

## 2017-10-23 ENCOUNTER — Other Ambulatory Visit: Payer: Self-pay | Admitting: Physician Assistant

## 2017-10-23 DIAGNOSIS — E039 Hypothyroidism, unspecified: Secondary | ICD-10-CM

## 2017-10-24 ENCOUNTER — Other Ambulatory Visit: Payer: Self-pay | Admitting: Physician Assistant

## 2017-10-24 DIAGNOSIS — E860 Dehydration: Secondary | ICD-10-CM

## 2017-10-24 NOTE — Progress Notes (Signed)
Patient called.  Left message for patient to call back.

## 2017-10-31 ENCOUNTER — Other Ambulatory Visit: Payer: Self-pay | Admitting: Physician Assistant

## 2017-10-31 DIAGNOSIS — F411 Generalized anxiety disorder: Secondary | ICD-10-CM

## 2017-11-01 NOTE — Telephone Encounter (Signed)
Ativan has been called into pharmacy on Nov 8th 2018 by DD

## 2017-11-22 ENCOUNTER — Other Ambulatory Visit: Payer: Self-pay | Admitting: Physician Assistant

## 2017-11-26 ENCOUNTER — Other Ambulatory Visit: Payer: Self-pay

## 2017-11-29 ENCOUNTER — Other Ambulatory Visit: Payer: Self-pay | Admitting: Physician Assistant

## 2017-11-29 DIAGNOSIS — F411 Generalized anxiety disorder: Secondary | ICD-10-CM

## 2017-11-29 NOTE — Telephone Encounter (Signed)
Ativan has been called into pharmacy on Dec 6th 2018 by DD

## 2017-12-11 ENCOUNTER — Other Ambulatory Visit: Payer: 59

## 2017-12-27 ENCOUNTER — Other Ambulatory Visit: Payer: Self-pay | Admitting: Physician Assistant

## 2017-12-27 DIAGNOSIS — F411 Generalized anxiety disorder: Secondary | ICD-10-CM

## 2017-12-27 MED ORDER — LORAZEPAM 1 MG PO TABS: 1.0000 mg | ORAL_TABLET | Freq: Two times a day (BID) | ORAL | 0 refills | Status: DC | PRN

## 2018-01-09 ENCOUNTER — Other Ambulatory Visit: Payer: Self-pay

## 2018-01-09 MED ORDER — FLUTICASONE-UMECLIDIN-VILANT 100-62.5-25 MCG/INH IN AEPB: 1.0000 | INHALATION_SPRAY | Freq: Every day | RESPIRATORY_TRACT | 5 refills | Status: DC

## 2018-01-14 ENCOUNTER — Other Ambulatory Visit: Payer: Self-pay | Admitting: Adult Health

## 2018-01-14 DIAGNOSIS — I10 Essential (primary) hypertension: Secondary | ICD-10-CM

## 2018-01-24 ENCOUNTER — Other Ambulatory Visit: Payer: Self-pay | Admitting: Physician Assistant

## 2018-01-24 DIAGNOSIS — F411 Generalized anxiety disorder: Secondary | ICD-10-CM

## 2018-01-25 ENCOUNTER — Other Ambulatory Visit: Payer: Self-pay | Admitting: Physician Assistant

## 2018-01-25 DIAGNOSIS — F411 Generalized anxiety disorder: Secondary | ICD-10-CM

## 2018-01-28 ENCOUNTER — Other Ambulatory Visit: Payer: Self-pay | Admitting: Physician Assistant

## 2018-01-28 ENCOUNTER — Other Ambulatory Visit: Payer: Self-pay

## 2018-01-28 MED ORDER — FLUTICASONE-SALMETEROL 250-50 MCG/DOSE IN AEPB: 1.0000 | INHALATION_SPRAY | Freq: Two times a day (BID) | RESPIRATORY_TRACT | 0 refills | Status: DC

## 2018-02-20 NOTE — Progress Notes (Signed)
Patient ID: Samuel Sharp, male   DOB: 08-27-1961, 57 y.o.   MRN: [id number]  Assessment and Plan:  Hypertension:  -Continue medication, azor samples given -monitor blood pressure at home.  -Continue DASH diet.   -Reminder to go to the ER if any CP, SOB, nausea, dizziness, severe HA, changes vision/speech, left arm numbness and tingling, and jaw pain.  Cholesterol: -Continue diet and exercise.  -Check cholesterol   Hypothyroidism -check TSH level, continue medications the same, reminded to take on an empty stomach 30-27mins before food.   Obesity with co morbidities - long discussion about weight loss, diet, and exercise  COPD -currently well controlled  -trelegy continue it - switch BB to selective- monitor BP - try xopenex since does not tolerate albuterol  Anxiety Ativan 1mg  BID no more than than at a time, not taking daily, will add on effexor to see if we can try to decrease ativan use, intolerant to zoloft Increase effexor to 75mg  daily  Dehydration Check labs  Continue diet and meds as discussed. Further disposition pending results of labs. Future Appointments  Date Time Provider Department Center  06/25/2018 10:00 AM Quentin Mulling, PA-C [facility] None    HPI 57 y.o. male  presents for 3 month follow up with hypertension, hyperlipidemia, prediabetes and vitamin D.   His blood pressure has been controlled at home- has been 120-130, today their BP is BP: 140/80.   He does workout. He denies chest pain, shortness of breath, dizziness.   He has asthma/COPD, he is on trilegy. He is on albuterol PRN but states that it causes him to have palpitations and he does not like to use it and feels that it does not help. He continues to have SOB, he is on metroprolol non selective as well 50mg  BID.    He has anxiety that affects his breathing, he has done a great job of cutting back from 2 mg TID to 1 mg BID, will continue this until we are able to find a daily anxiety medication  that helps and hopefully at that time he will start taking the valium AS needed BID.  Patient always has "dehydration" on BUN/Cret, but states he drinks 6-7 64 oz glasses of water a day. Will get osmolality studies.   He is on thyroid medication. His medication was changed last visit however, 1 pill daily at night in the middle of the night.   Lab Results  Component Value Date   TSH 5.12 (H) 10/22/2017    He is on cholesterol medication and denies myalgias. His cholesterol is at goal. The cholesterol last visit was:   Lab Results  Component Value Date   CHOL 180 10/22/2017   HDL 47 10/22/2017   LDLCALC 92 06/21/2017   TRIG 288 (H) 10/22/2017   CHOLHDL 3.8 10/22/2017    He has been working on diet and exercise for prediabetes, and denies foot ulcerations, hyperglycemia, hypoglycemia , increased appetite, nausea, paresthesia of the feet, polydipsia, polyuria, visual disturbances, vomiting and weight loss. Last A1C in the office was:  Lab Results  Component Value Date   HGBA1C 5.0 06/21/2017   Patient is on Vitamin D supplement.  Lab Results  Component Value Date   VD25OH 75 06/21/2017     BMI is Body mass index is 34.06 kg/m., he is working on diet and exercise. Wt Readings from Last 3 Encounters:  02/21/18 244 lb 3.2 oz (110.8 kg)  10/22/17 234 lb 3.2 oz (106.2 kg)  09/10/17 234 lb  2 oz (106.2 kg)   Current Medications:  Current Outpatient Medications on File Prior to Visit  Medication Sig Dispense Refill  . amLODipine-olmesartan (AZOR) 5-40 MG tablet Take 1 tablet by mouth daily. 90 tablet 1  . amLODipine-olmesartan (AZOR) 5-40 MG tablet TAKE 1 TABLET BY MOUTH ONCE DAILY 30 tablet 3  . atorvastatin (LIPITOR) 40 MG tablet TAKE 1 TABLET BY MOUTH ONCE DAILY 90 tablet 0  . clobetasol ointment (TEMOVATE) 0.05 % Apply as needed    . Flaxseed, Linseed, (EQL FLAX SEED OIL) 1000 MG CAPS Take 1,000 mg by mouth daily.    . Fluticasone-Salmeterol (ADVAIR DISKUS) 250-50 MCG/DOSE AEPB  Inhale 1 puff into the lungs every 12 (twelve) hours. 60 each 0  . Fluticasone-Umeclidin-Vilant (TRELEGY ELLIPTA) 100-62.5-25 MCG/INH AEPB Inhale 1 puff into the lungs daily. 60 each 5  . HUMIRA PEN 40 MG/0.8ML PNKT     . levothyroxine (SYNTHROID, LEVOTHROID) 100 MCG tablet TAKE 1 TABLET BY MOUTH ONCE DAILY 90 tablet 1  . LORazepam (ATIVAN) 1 MG tablet TAKE 1 TABLET BY MOUTH TWICE DAILY AS NEEDED FOR ANXIETY (NEED  TO  CUT  BACK  FURTHER) 60 tablet 0  . meclizine (ANTIVERT) 25 MG tablet 1/2-1 pill up to 3 times daily for motion sickness/dizziness (Patient not taking: Reported on 10/22/2017) 60 tablet 0  . metoprolol tartrate (LOPRESSOR) 50 MG tablet TAKE 1 TABLET BY MOUTH TWICE DAILY 180 tablet 0  . montelukast (SINGULAIR) 10 MG tablet Take 1 tablet (10 mg total) by mouth daily. 30 tablet 2  . Multiple Vitamins-Minerals (MULTIVITAMIN WITH MINERALS) tablet Take 1 tablet by mouth daily.    . Omega-3 Fatty Acids (FISH OIL) 1000 MG CAPS Take 1,000 mg by mouth daily.    Marland Kitchen PROAIR HFA 108 (90 BASE) MCG/ACT inhaler INHALE 2 PUFFS INTO THE LUNGS EVERY 4 HOURS AS NEEDED FOR WHEEZING OR SHORTNESS OF BREATH. 18 g 3  . triamcinolone ointment (KENALOG) 0.1 % APPLY ONE OINTMENT TOPICALLY TWICE DAILY 80 g 1  . venlafaxine XR (EFFEXOR-XR) 37.5 MG 24 hr capsule TAKE 1 CAPSULE BY MOUTH ONCE DAILY 30 capsule 2   No current facility-administered medications on file prior to visit.     Medical History:  Past Medical History:  Diagnosis Date  . Asthma    Present all life. Dx as child. Been in [facility] since 2003--pft 06/03/10 FEV1 3.14/ 82%, FEV1/FVC 0.54; insig resp to BD, DLCO 75%, ni lUNG  Vol  . COPD (chronic obstructive pulmonary disease) (HCC)   . Detached retina   . History of TB skin testing    negative 2005-2010  . Hyperlipidemia   . Hypertension    on lisinopril 2002/2003--changed ARB march 2011  . Lung nodule    5mm on ct 11/10/10- likely lymph node  . Palpitations    started atenolol since 2003.  recollects hx of holter. 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 informed it "Not a dangerous condition"-changed to lopressor 12/15/2010  . Psoriasis    on enbre;l since 005. started in [place]. Stopped 2011 by [place] derm due to nodule on chest  . Pulmonary embolism (HCC)    does not recollect tpA or being on lot of o2 or hypotension. 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 Ace Inhibitors     cough  . Haldol [Haloperidol Lactate]     psychosis  . Haloperidol Lactate  REACTION: phsycotic break     Review of Systems:  Review of Systems  Constitutional: Negative for chills, diaphoresis and fever.  HENT: Negative for congestion, ear pain and sore throat.   Eyes: Negative for blurred vision and double vision.  Respiratory: Negative for cough, shortness of breath and wheezing.   Cardiovascular: Positive for palpitations. Negative for chest pain and leg swelling.  Gastrointestinal: Positive for heartburn. Negative for blood in stool, constipation, diarrhea and melena.  Genitourinary: Negative.   Neurological: Negative for headaches.    Family history- Review and unchanged  Social history- Review and unchanged  Physical Exam: BP 140/80   Pulse 68   Temp (!) 97.3 F (36.3 C) (Temporal)   Resp 16   Ht 5\' 11"  (1.803 m)   Wt 244 lb 3.2 oz (110.8 kg)   BMI 34.06 kg/m  Wt Readings from Last 3 Encounters:  02/21/18 244 lb 3.2 oz (110.8 kg)  10/22/17 234 lb 3.2 oz (106.2 kg)  09/10/17 234 lb 2 oz (106.2 kg)    General Appearance: Well nourished well developed, in no apparent distress. Eyes: PERRLA, EOMs, conjunctiva no swelling or erythema ENT/Mouth: Ear canals normal without obstruction, swelling, erythma, discharge.  TMs normal bilaterally.  Oropharynx moist, clear, without exudate, or postoropharyngeal swelling. Neck: Supple, thyroid normal,no cervical adenopathy  Respiratory: Respiratory  effort normal, Breath sounds clear A&P without rhonchi, wheeze, or rale.  No retractions, no accessory usage. Cardio: RRR with frequent PVC's, no MRGs. Brisk peripheral pulses without edema.  Abdomen: Soft, + BS,  Non tender, no guarding, rebound, hernias, masses. Musculoskeletal: Full ROM, 5/5 strength, Normal gait Skin: Warm, dry without rashes, lesions, ecchymosis.  Neuro: Awake and oriented X 3, Cranial nerves intact. Normal muscle tone, no cerebellar symptoms. Psych: Normal affect, Insight and Judgment appropriate.    Quentin MullingAmanda Coleson Kant, PA-C 4:01 PM [facility],

## 2018-02-21 ENCOUNTER — Ambulatory Visit: Payer: 59 | Admitting: Physician Assistant

## 2018-02-21 ENCOUNTER — Encounter: Payer: Self-pay | Admitting: Physician Assistant

## 2018-02-21 VITALS — BP 140/80 | HR 68 | Temp 97.3°F | Resp 16 | Ht 71.0 in | Wt 244.2 lb

## 2018-02-21 DIAGNOSIS — I719 Aortic aneurysm of unspecified site, without rupture: Secondary | ICD-10-CM | POA: Diagnosis not present

## 2018-02-21 DIAGNOSIS — F411 Generalized anxiety disorder: Secondary | ICD-10-CM | POA: Diagnosis not present

## 2018-02-21 DIAGNOSIS — E860 Dehydration: Secondary | ICD-10-CM | POA: Diagnosis not present

## 2018-02-21 DIAGNOSIS — I10 Essential (primary) hypertension: Secondary | ICD-10-CM | POA: Diagnosis not present

## 2018-02-21 DIAGNOSIS — E039 Hypothyroidism, unspecified: Secondary | ICD-10-CM

## 2018-02-21 DIAGNOSIS — I2510 Atherosclerotic heart disease of native coronary artery without angina pectoris: Secondary | ICD-10-CM | POA: Diagnosis not present

## 2018-02-21 DIAGNOSIS — Z79899 Other long term (current) drug therapy: Secondary | ICD-10-CM

## 2018-02-21 DIAGNOSIS — E782 Mixed hyperlipidemia: Secondary | ICD-10-CM | POA: Diagnosis not present

## 2018-02-21 DIAGNOSIS — J449 Chronic obstructive pulmonary disease, unspecified: Secondary | ICD-10-CM | POA: Diagnosis not present

## 2018-02-21 DIAGNOSIS — J439 Emphysema, unspecified: Secondary | ICD-10-CM

## 2018-02-21 DIAGNOSIS — I7121 Aneurysm of the ascending aorta, without rupture: Secondary | ICD-10-CM

## 2018-02-21 MED ORDER — VENLAFAXINE HCL ER 75 MG PO CP24: ORAL_CAPSULE | ORAL | 2 refills | Status: DC

## 2018-02-21 MED ORDER — BISOPROLOL-HYDROCHLOROTHIAZIDE 10-6.25 MG PO TABS: 1.0000 | ORAL_TABLET | Freq: Every day | ORAL | 3 refills | Status: DC

## 2018-02-21 MED ORDER — LORAZEPAM 1 MG PO TABS
ORAL_TABLET | ORAL | 2 refills | Status: DC
Start: 2018-02-21 — End: 2018-05-15

## 2018-02-21 MED ORDER — LEVALBUTEROL TARTRATE 45 MCG/ACT IN AERO: 2.0000 | INHALATION_SPRAY | Freq: Four times a day (QID) | RESPIRATORY_TRACT | 12 refills | Status: AC | PRN

## 2018-02-21 NOTE — Patient Instructions (Addendum)
Stop the metoprolol and start on the ziac 10 mg once day night or morning The metoprolol is non selective beta blocker so it can cause shortness of breath as well- so we will switch this to the ziac Monitor BP at home  Try the xopenex inhaler AS needed for shortness of breath  You can increase to 1 effexor daily for 1-2 months and you can increase to 2 pills a day or 150mg  IF you feel that you may benefit.   Monitor your blood pressure at home, please keep a record and bring that in with you to your next office visit.   Go to the ER if any CP, SOB, nausea, dizziness, severe HA, changes vision/speech  Due to a recent study, SPRINT, we have changed our goal for the systolic or top blood pressure number. Ideally we want your top number at 120.  In the [facility] Trial, 5000 people were randomized to a goal BP of 120 and 5000 people were randomized to a goal BP of less than 140. The patients with the goal BP at 120 had LESS DEMENTIA, LESS HEART ATTACKS, AND LESS STROKES, AS WELL AS OVERALL DECREASED MORTALITY OR DEATH RATE.   If you are willing, our goal BP is the top number of 120.  Your most recent BP: BP: 140/80   Take your medications faithfully as instructed. Maintain a healthy weight. Get at least 150 minutes of aerobic exercise per week. Minimize salt intake. Minimize alcohol intake  DASH Eating Plan DASH stands for "Dietary Approaches to Stop Hypertension." The DASH eating plan is a healthy eating plan that has been shown to reduce high blood pressure (hypertension). Additional health benefits may include reducing the risk of type 2 diabetes mellitus, heart disease, and stroke. The DASH eating plan may also help with weight loss. WHAT DO I NEED TO KNOW ABOUT THE DASH EATING PLAN? For the DASH eating plan, you will follow these general guidelines:  Choose foods with a percent daily value for sodium of less than 5% (as listed on the food label).  Use salt-free seasonings or herbs instead  of table salt or sea salt.  Check with your health care provider or pharmacist before using salt substitutes.  Eat lower-sodium products, often labeled as "lower sodium" or "no salt added."  Eat fresh foods.  Eat more vegetables, fruits, and low-fat dairy products.  Choose whole grains. Look for the word "whole" as the first word in the ingredient list.  Choose fish and skinless chicken or [place] more often than red meat. Limit fish, poultry, and meat to 6 oz (170 g) each day.  Limit sweets, desserts, sugars, and sugary drinks.  Choose heart-healthy fats.  Limit cheese to 1 oz (28 g) per day.  Eat more home-cooked food and less restaurant, buffet, and fast food.  Limit fried foods.  Cook foods using methods other than frying.  Limit canned vegetables. If you do use them, rinse them well to decrease the sodium.  When eating at a restaurant, ask that your food be prepared with less salt, or no salt if possible. WHAT FOODS CAN I EAT? Seek help from a dietitian for individual calorie needs. Grains Whole grain or whole wheat bread. Brown rice. Whole grain or whole wheat pasta. Quinoa, bulgur, and whole grain cereals. Low-sodium cereals. Corn or whole wheat flour tortillas. Whole grain cornbread. Whole grain crackers. Low-sodium crackers. Vegetables Fresh or frozen vegetables (raw, steamed, roasted, or grilled). Low-sodium or reduced-sodium tomato and vegetable juices. Low-sodium  or reduced-sodium tomato sauce and paste. Low-sodium or reduced-sodium canned vegetables.  Fruits All fresh, canned (in natural juice), or frozen fruits. Meat and Other Protein Products Ground beef (85% or leaner), grass-fed beef, or beef trimmed of fat. Skinless chicken or [place]. Ground chicken or [place]. Pork trimmed of fat. All fish and seafood. Eggs. Dried beans, peas, or lentils. Unsalted nuts and seeds. Unsalted canned beans. Dairy Low-fat dairy products, such as skim or 1% milk, 2% or reduced-fat  cheeses, low-fat ricotta or cottage cheese, or plain low-fat yogurt. Low-sodium or reduced-sodium cheeses. Fats and Oils Tub margarines without trans fats. Light or reduced-fat mayonnaise and salad dressings (reduced sodium). Avocado. Safflower, olive, or canola oils. Natural peanut or almond butter. Other Unsalted popcorn and pretzels. The items listed above may not be a complete list of recommended foods or beverages. Contact your dietitian for more options. WHAT FOODS ARE NOT RECOMMENDED? Grains White bread. White pasta. White rice. Refined cornbread. Bagels and croissants. Crackers that contain trans fat. Vegetables Creamed or fried vegetables. Vegetables in a cheese sauce. Regular canned vegetables. Regular canned tomato sauce and paste. Regular tomato and vegetable juices. Fruits Dried fruits. Canned fruit in light or heavy syrup. Fruit juice. Meat and Other Protein Products Fatty cuts of meat. Ribs, chicken wings, bacon, sausage, bologna, salami, chitterlings, fatback, hot dogs, bratwurst, and packaged luncheon meats. Salted nuts and seeds. Canned beans with salt. Dairy Whole or 2% milk, cream, half-and-half, and cream cheese. Whole-fat or sweetened yogurt. Full-fat cheeses or blue cheese. Nondairy creamers and whipped toppings. Processed cheese, cheese spreads, or cheese curds. Condiments Onion and garlic salt, seasoned salt, table salt, and sea salt. Canned and packaged gravies. Worcestershire sauce. Tartar sauce. Barbecue sauce. Teriyaki sauce. Soy sauce, including reduced sodium. Steak sauce. Fish sauce. Oyster sauce. Cocktail sauce. Horseradish. Ketchup and mustard. Meat flavorings and tenderizers. Bouillon cubes. Hot sauce. Tabasco sauce. Marinades. Taco seasonings. Relishes. Fats and Oils Butter, stick margarine, lard, shortening, ghee, and bacon fat. Coconut, palm kernel, or palm oils. Regular salad dressings. Other Pickles and olives. Salted popcorn and pretzels. The items  listed above may not be a complete list of foods and beverages to avoid. Contact your dietitian for more information. WHERE CAN I FIND MORE INFORMATION? National Heart, Lung, and Blood Institute: CableProm[employer].itwww.nhlbi.nih.gov/health/health-topics/topics/dash/ Document Released: 11/30/2011 Document Revised: 04/27/2014 Document Reviewed: 10/15/2013 [facility] Patient Information 2015 [employer], [place]. This information is not intended to replace advice given to you by your health care provider. Make sure you discuss any questions you have with your health care provider.

## 2018-02-22 LAB — HEPATIC FUNCTION PANEL
AG RATIO: 1.6 (calc) (ref 1.0–2.5)
ALKALINE PHOSPHATASE (APISO): 75 U/L (ref 40–115)
ALT: 24 U/L (ref 9–46)
AST: 22 U/L (ref 10–35)
Albumin: 4.2 g/dL (ref 3.6–5.1)
BILIRUBIN TOTAL: 0.8 mg/dL (ref 0.2–1.2)
Bilirubin, Direct: 0.2 mg/dL (ref 0.0–0.2)
Globulin: 2.7 g/dL (calc) (ref 1.9–3.7)
Indirect Bilirubin: 0.6 mg/dL (calc) (ref 0.2–1.2)
TOTAL PROTEIN: 6.9 g/dL (ref 6.1–8.1)

## 2018-02-22 LAB — LIPID PANEL
CHOLESTEROL: 183 mg/dL (ref <200)
HDL: 49 mg/dL (ref >40)
LDL Cholesterol (Calc): 112 mg/dL (calc) — ABNORMAL HIGH
NON-HDL CHOLESTEROL (CALC): 134 mg/dL — AB (ref <130)
Total CHOL/HDL Ratio: 3.7 (calc) (ref <5.0)
Triglycerides: 111 mg/dL (ref <150)

## 2018-02-22 LAB — OSMOLALITY: Osmolality: 281 mOsm/kg (ref 278–305)

## 2018-02-22 LAB — CBC WITH DIFFERENTIAL/PLATELET
BASOS ABS: 57 {cells}/uL (ref 0–200)
Basophils Relative: 0.7 %
EOS ABS: 525 {cells}/uL — AB (ref 15–500)
Eosinophils Relative: 6.4 %
HCT: 36.8 % — ABNORMAL LOW (ref 38.5–50.0)
Hemoglobin: 13.2 g/dL (ref 13.2–17.1)
Lymphs Abs: 2058 cells/uL (ref 850–3900)
MCH: 33.4 pg — AB (ref 27.0–33.0)
MCHC: 35.9 g/dL (ref 32.0–36.0)
MCV: 93.2 fL (ref 80.0–100.0)
MPV: 9.5 fL (ref 7.5–12.5)
Monocytes Relative: 10.9 %
NEUTROS PCT: 56.9 %
Neutro Abs: 4666 cells/uL (ref 1500–7800)
Platelets: 276 10*3/uL (ref 140–400)
RBC: 3.95 10*6/uL — ABNORMAL LOW (ref 4.20–5.80)
RDW: 12.2 % (ref 11.0–15.0)
TOTAL LYMPHOCYTE: 25.1 %
WBC: 8.2 10*3/uL (ref 3.8–10.8)
WBCMIX: 894 {cells}/uL (ref 200–950)

## 2018-02-22 LAB — BASIC METABOLIC PANEL WITH GFR
BUN: 24 mg/dL (ref 7–25)
CALCIUM: 9.3 mg/dL (ref 8.6–10.3)
CO2: 24 mmol/L (ref 20–32)
CREATININE: 1.1 mg/dL (ref 0.70–1.33)
Chloride: 102 mmol/L (ref 98–110)
GFR, EST NON AFRICAN AMERICAN: 74 mL/min/{1.73_m2} (ref >=60)
GFR, Est African American: 85 mL/min/{1.73_m2} (ref >=60)
Glucose, Bld: 74 mg/dL (ref 65–99)
POTASSIUM: 4.2 mmol/L (ref 3.5–5.3)
SODIUM: 135 mmol/L (ref 135–146)

## 2018-02-22 LAB — URINALYSIS, ROUTINE W REFLEX MICROSCOPIC
Bilirubin Urine: NEGATIVE
Glucose, UA: NEGATIVE
Hgb urine dipstick: NEGATIVE
Ketones, ur: NEGATIVE
Leukocytes, UA: NEGATIVE
NITRITE: NEGATIVE
PH: 5.5 (ref 5.0–8.0)
Protein, ur: NEGATIVE
SPECIFIC GRAVITY, URINE: 1.015 (ref 1.001–1.03)

## 2018-02-22 LAB — MICROALBUMIN / CREATININE URINE RATIO
CREATININE, URINE: 83 mg/dL (ref 20–320)
MICROALB UR: 5.3 mg/dL
MICROALB/CREAT RATIO: 64 ug/mg{creat} — AB (ref <30)

## 2018-02-22 LAB — TSH: TSH: 2.6 mIU/L (ref 0.40–4.50)

## 2018-02-22 LAB — OSMOLALITY, URINE: Osmolality, Ur: 529 mOsm/kg (ref 50–1200)

## 2018-05-15 ENCOUNTER — Other Ambulatory Visit: Payer: Self-pay | Admitting: Physician Assistant

## 2018-05-15 DIAGNOSIS — F411 Generalized anxiety disorder: Secondary | ICD-10-CM

## 2018-05-16 ENCOUNTER — Ambulatory Visit: Payer: 59 | Admitting: Internal Medicine

## 2018-05-16 ENCOUNTER — Encounter: Payer: Self-pay | Admitting: Internal Medicine

## 2018-05-16 VITALS — BP 130/88 | HR 70 | Ht 73.0 in | Wt 234.4 lb

## 2018-05-16 DIAGNOSIS — J439 Emphysema, unspecified: Secondary | ICD-10-CM

## 2018-05-16 NOTE — Progress Notes (Signed)
Went to see patient earlier today but because of the running late he left the office before I could see the patient.  I indicated to him it would be 5:15 PM before I would see him.  But by 5:06 PM he had left

## 2018-05-21 ENCOUNTER — Other Ambulatory Visit: Payer: Self-pay | Admitting: Adult Health

## 2018-05-27 ENCOUNTER — Other Ambulatory Visit: Payer: Self-pay | Admitting: Physician Assistant

## 2018-05-27 ENCOUNTER — Other Ambulatory Visit: Payer: Self-pay | Admitting: *Deleted

## 2018-05-27 DIAGNOSIS — I10 Essential (primary) hypertension: Secondary | ICD-10-CM

## 2018-05-27 MED ORDER — AMLODIPINE-OLMESARTAN 5-40 MG PO TABS: 1.0000 | ORAL_TABLET | Freq: Every day | ORAL | 1 refills | Status: DC

## 2018-06-14 ENCOUNTER — Other Ambulatory Visit: Payer: Self-pay | Admitting: *Deleted

## 2018-06-14 MED ORDER — VENLAFAXINE HCL ER 75 MG PO CP24: ORAL_CAPSULE | ORAL | 2 refills | Status: DC

## 2018-06-17 ENCOUNTER — Other Ambulatory Visit: Payer: Self-pay | Admitting: *Deleted

## 2018-06-17 MED ORDER — VENLAFAXINE HCL ER 75 MG PO CP24: ORAL_CAPSULE | ORAL | 2 refills | Status: DC

## 2018-06-25 ENCOUNTER — Encounter: Payer: Self-pay | Admitting: Physician Assistant

## 2018-07-11 ENCOUNTER — Other Ambulatory Visit: Payer: Self-pay | Admitting: Internal Medicine

## 2018-07-15 ENCOUNTER — Ambulatory Visit (INDEPENDENT_AMBULATORY_CARE_PROVIDER_SITE_OTHER): Payer: Self-pay | Admitting: Internal Medicine

## 2018-07-15 ENCOUNTER — Encounter: Payer: Self-pay | Admitting: Internal Medicine

## 2018-07-15 VITALS — BP 118/86 | HR 71 | Temp 97.5°F | Ht 72.0 in | Wt 224.2 lb

## 2018-07-15 DIAGNOSIS — J40 Bronchitis, not specified as acute or chronic: Secondary | ICD-10-CM

## 2018-07-15 MED ORDER — PREDNISONE 20 MG PO TABS: ORAL_TABLET | ORAL | 0 refills | Status: DC

## 2018-07-15 MED ORDER — AZITHROMYCIN 250 MG PO TABS: ORAL_TABLET | ORAL | 1 refills | Status: DC

## 2018-07-15 NOTE — Progress Notes (Addendum)
  Subjective:    Patient ID: Samuel Sharp, male    DOB: 12/11/1961, 57 y.o.   MRN: [id number]  HPI   This very nice 57 yo DWM w/HTN, HLD, PreDM, Hypothyroidism, Hx/o Asthma and Vit D deficiency presents with 1 week prodrome of productive cough /sputum production not recovering with OTC meds. Denies fever, chills, sweats, dyspnea.    Medication Sig  . amLODipine-olmesartan  5-40  Take 1 tablet by mouth daily.  Marland Kitchen. atorvastatin  40 MG tablet TAKE 1 TABLET BY MOUTH ONCE DAILY  . bisoprolol-hctz10-6.25 MG tablet Take 1 tablet by mouth daily.  . TEMOVATE 0.05 % Apply as needed  . FLAX SEED OIL 1000 MG  Take 1,000 mg by mouth daily.  . TRELEGY ELLIPTA 100-62.5-25 Inhale 1 puff into the lungs daily.  Marland Kitchen. HUMIRA PEN 40 MG/0.8ML PNKT   . lXOPENEX HFA inhaler 2 puffs  every 6 hrs as needed for wheezing  . Levothyroxine 100 MCG tablet TAKE 1 TABLET BY MOUTH ONCE DAILY  . LORazepam  1 MG tablet TAKE 1 TAB TWICE DAILY AS NEEDED  . Multiple Vitamins-Minerals  Take 1 tablet by mouth daily.  . Omega-3 FISH OIL 1000 MG  Take 1,000 mg by mouth daily.  Marland Kitchen. venlafaxine XR  75 MG  TAKE 1 TO 2 CAPS EVERY DAY FOR ANXIETY   Allergies  Allergen Reactions  . Ace Inhibitors     cough  . Haldol [Haloperidol Lactate]     psychosis  . Haloperidol Lactate     REACTION: phsycotic break   .  10 point systems review negative except as above.    Objective:   Physical Exam  BP 118/86   Pulse 71   Temp (!) 97.5 F (36.4 C)   Ht 6' (1.829 m)   Wt 224 lb 3.2 oz (101.7 kg)   SpO2 95%   BMI 30.41 kg/m   HEENT - WNL. Neck - supple.  Chest - Bilat scattered Ins Rales and Exp Rhonchi clearing partially w/cough.  No wheezes. Cor - Nl HS. RRR w/o sig MGR. PP 1(+). No edema. MS- FROM w/o deformities.  Gait Nl. Neuro -  Nl w/o focal abnormalities.    Assessment & Plan:   1. Bronchitis  - predniSONE  20 MG tablet; 1 tab 3 x day for 3 days, then 1 tab 2 x day for 3 days, then 1 tab 1 x day for 5 days  Disp: 20 tablet;  Refill: 0 - azithromycin  250 MG tablet; Take 2 tablets (500 mg) on  Day 1,  followed by 1 tablet (250 mg) once daily on Days 2 through 5.  Dispense: 6 each; Refill: 1      .

## 2018-07-20 ENCOUNTER — Encounter: Payer: Self-pay | Admitting: Internal Medicine

## 2018-08-06 ENCOUNTER — Other Ambulatory Visit: Payer: Self-pay | Admitting: Adult Health

## 2018-08-06 DIAGNOSIS — F411 Generalized anxiety disorder: Secondary | ICD-10-CM

## 2018-08-21 NOTE — Progress Notes (Addendum)
Complete Physical  Assessment and Plan:  Essential hypertension .plabhtn -     CBC with Differential/Platelet -     COMPLETE METABOLIC PANEL WITH GFR -     TSH  Coronary artery disease involving native coronary artery of native heart without angina pectoris Control blood pressure, cholesterol, glucose, increase exercise.   Asthma with chronic obstructive pulmonary disease (COPD) (HCC) -     predniSONE (DELTASONE) 20 MG tablet; 1 pill 3 x a day for 3 days, 1 pill 2 x a day x 3 days, 1 pill a day x 5 days with food  Pulmonary emphysema, unspecified emphysema type (HCC) -     predniSONE (DELTASONE) 20 MG tablet; 1 pill 3 x a day for 3 days, 1 pill 2 x a day x 3 days, 1 pill a day x 5 days with food  Aortic root aneurysm (HCC) Control blood pressure, cholesterol, glucose, increase exercise.   Hypothyroidism, unspecified type -     TSH Hypothyroidism-check TSH level, continue medications the same, reminded to take on an empty stomach 30-70mins before food.    Medication management  Abnormal glucose Discussed disease progression and risks Discussed diet/exercise, weight management and risk modification  Vitamin D deficiency  Multiple lung nodules on CT Will monitor  History of pulmonary embolus (PE) Go to ER if any symptoms  Mixed hyperlipidemia check lipids decrease fatty foods increase activity.  Psoriasis Continue creams  Generalized anxiety disorder Continue meds, will try to cut back  Palpitations Monitor  Gastroesophageal reflux disease, esophagitis presence not specified Continue PPI/H2 blocker, diet discussed  Encounter for general adult medical examination with abnormal findings  The patient was advised to call immediately if he has any concerning symptoms in the interval. The patient voices understanding of current treatment options and is in agreement with the current care plan.The patient knows to call the clinic with any problems, questions or  concerns or go to the ER if any further progression of symptoms.   Discussed med's effects and SE's. Screening labs and tests as requested with regular follow-up as recommended. Over 40 minutes of exam, counseling, chart review and critical decision making was performed  HPI Patient presents for a complete physical.   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 He denies chest pain, shortness of breath, dizziness.  He has COPD, he had a CT chest 05/2017, with Dr. Jamison Neighbor, showed stable pulmonary nodules, had one new nodule RUL, will need repeat scan 6-12 months. Also has aneurysmal dilatation of ascending aorta, 4.5 cm and stable. He does not have insurance so he is on breo only and not on trelegy.   His weight is up, he has orthopnea as soon as he lays down, he feels he gets anxiety and can not breath when he sleeps lying down, only sleeping 2 hours a night.   He has anxiety, is on ativan has gone from 2mg  to 1mg  and is now taking 1-2 x a day, decreasing dose but declines daily anxiety medication, has been advised to follow up psych. He has decreased it but he has walked away from his last job due to SOB and the heat and he is getting new job and needs a note for the ativan.  BMI is Body mass index is 33.09 kg/m., he is working on diet and exercise.  Wt Readings from Last 3 Encounters:  08/22/18 244 lb (110.7 kg)  07/15/18 224 lb 3.2 oz (101.7 kg)  05/16/18 234 lb 6.4 oz (106.3 kg)    He is on cholesterol medication, lipitor 40mg  daily and denies myalgias. His cholesterol is not at goal. The cholesterol last visit was:   Lab Results  Component Value Date   CHOL 183 02/21/2018   HDL 49 02/21/2018   LDLCALC 112 (H) 02/21/2018   TRIG 111 02/21/2018   CHOLHDL 3.7 02/21/2018   He is on thyroid medication. His medication was not changed last  visit.   Lab Results  Component Value Date   TSH 2.60 02/21/2018  .  Last A1C in the office was:  Lab Results  Component Value Date   HGBA1C 5.0 06/21/2017   Last GFR: Lab Results  Component Value Date   GFRNONAA 74 02/21/2018    Patient is on Vitamin D supplement.   Lab Results  Component Value Date   VD25OH 31 06/21/2017     Last PSA was: Lab Results  Component Value Date   PSA 0.7 06/21/2017    Current Medications:  Current Outpatient Medications on File Prior to Visit  Medication Sig Dispense Refill  . amLODipine-olmesartan (AZOR) 5-40 MG tablet TAKE 1 TABLET BY MOUTH ONCE DAILY 90 tablet 1  . atorvastatin (LIPITOR) 40 MG tablet TAKE 1 TABLET BY MOUTH ONCE DAILY 90 tablet 0  . bisoprolol-hydrochlorothiazide (ZIAC) 10-6.25 MG tablet Take 1 tablet by mouth daily. 90 tablet 3  . clobetasol ointment (TEMOVATE) 0.05 % Apply as needed    . Flaxseed, Linseed, (EQL FLAX SEED OIL) 1000 MG CAPS Take 1,000 mg by mouth daily.    . Fluticasone-Umeclidin-Vilant (TRELEGY ELLIPTA) 100-62.5-25 MCG/INH AEPB Inhale 1 puff into the lungs daily. 60 each 5  . HUMIRA PEN 40 MG/0.8ML PNKT     . levalbuterol (XOPENEX HFA) 45 MCG/ACT inhaler Inhale 2 puffs into the lungs every 6 (six) hours as needed for wheezing or shortness of breath. 1 Inhaler 12  . levothyroxine (SYNTHROID, LEVOTHROID) 100 MCG tablet TAKE 1 TABLET BY MOUTH ONCE DAILY 90 tablet 1  . LORazepam (ATIVAN) 1 MG tablet TAKE 1 TABLET BY MOUTH TWICE A DAY AS NEEDED FOR ANXIETY 60 tablet 0  . Multiple Vitamins-Minerals (MULTIVITAMIN WITH MINERALS) tablet Take 1 tablet by mouth daily.    . Omega-3 Fatty Acids (FISH OIL) 1000 MG CAPS Take 1,000 mg by mouth daily.    Marland Kitchen venlafaxine XR (EFFEXOR-XR) 75 MG 24 hr capsule TAKE 1 TO 2 CAPSULES BY MOUTH EVERY DAY FOR ANXIETY 180 capsule 1   No current facility-administered medications on file prior to visit.    Allergies:  Allergies  Allergen Reactions  . Ace Inhibitors     cough  .  Haldol [Haloperidol Lactate]     psychosis  . Haloperidol Lactate     REACTION: phsycotic break   Health Maintenance:  Immunization History  Administered Date(s) Administered  . Influenza Split 11/15/2012  . Influenza Whole 09/24/2010  . Pneumococcal Conjugate-13 05/31/2017  . Pneumococcal Polysaccharide-23 06/03/2010  . Tdap 06/12/2016   Tetanus: 2017 Pneumovax:2011 Prevnar 13: 2018 Flu vaccine: declines Zostavax:  DEXA: Colonoscopy: 2012 due 10 years EGD: PFT 2013 Ct chest 05/2017, repeat 6-12 months- patient due to cost wants to wait until  [place] AB 2013 US neck 2015 US carotid 2013  Eye Exam: glasses, Dr. Luciana Axe, years Dentist: None Patient Care Team: Lucky Cowboy, MD as PCP - General (Internal Medicine) Marchelle Gearing,  Carmin MuskratMurali, MD as Consulting Physician (Pulmonary Disease) Elmon ElseGould, Karen, MD as Consulting Physician (Dermatology) Luciana Axeankin, Alford HighlandGary A, MD as Consulting Physician (Ophthalmology) Wendall StadeNishan, Peter C, MD as Consulting Physician (Cardiology) Sharrell KuMedoff, Jeffrey, MD as Consulting Physician (Gastroenterology) Waymon BudgeYoung, Clinton D, MD as Consulting Physician (Pulmonary Disease)  Medical History:  has Mixed hyperlipidemia; Essential hypertension; History of pulmonary embolus (PE); GERD; Multiple lung nodules on CT; Asthma with chronic obstructive pulmonary disease (COPD) (HCC); Psoriasis; CAD (coronary artery disease); Aortic root aneurysm (HCC); Abnormal glucose; Vitamin D deficiency; Medication management; Hypothyroidism; Generalized anxiety disorder; Pulmonary emphysema (HCC); and Palpitations on their problem list. Surgical History:  He  has a past surgical history that includes Retinal detachment surgery; Video bronchoscopy (07/26/2012); and Thoracentesis. Family History:  His family history includes Allergies in his mother; Alzheimer's disease in his maternal aunt and mother; Asthma in his mother; Emphysema in his father. Social History:   reports that he quit smoking about 8  years ago. His smoking use included cigarettes. He has a 70.00 pack-year smoking history. He has never used smokeless tobacco. He reports that he drinks about 12.0 standard drinks of alcohol per week. He reports that he does not use drugs.   Review of Systems:  Review of Systems  Constitutional: Negative for chills, diaphoresis and fever.  HENT: Negative for congestion, ear pain and sore throat.   Eyes: Negative for blurred vision and double vision.  Respiratory: Negative for cough, shortness of breath and wheezing.   Cardiovascular: Negative for chest pain, palpitations and leg swelling.  Gastrointestinal: Positive for heartburn. Negative for blood in stool, constipation, diarrhea and melena.  Genitourinary: Negative.   Neurological: Negative for headaches.    Physical Exam: Estimated body mass index is 33.09 kg/m as calculated from the following:   Height as of this encounter: 6' (1.829 m).   Weight as of this encounter: 244 lb (110.7 kg). BP (!) 148/90   Pulse 63   Temp (!) 97.3 F (36.3 C)   Resp 18   Ht 6' (1.829 m)   Wt 244 lb (110.7 kg)   SpO2 94%   BMI 33.09 kg/m  General Appearance: Well nourished, in no apparent distress.  Eyes: PERRLA, EOMs, conjunctiva no swelling or erythema, normal fundi and vessels.  Sinuses: No Frontal/maxillary tenderness  ENT/Mouth: Ext aud canals clear, normal light reflex with TMs without erythema, bulging. Good dentition. No erythema, swelling, or exudate on post pharynx. Tonsils not swollen or erythematous. Hearing normal.  Neck: Supple, thyroid normal. No bruits  Respiratory: Respiratory effort normal, BS equal bilaterally without rales, rhonchi, wheezing or stridor.  Cardio: RRR without murmurs, rubs or gallops. Brisk peripheral pulses without edema.  Chest: symmetric, with normal excursions and percussion.  Abdomen: Soft, nontender, no guarding, rebound, hernias, masses, or organomegaly.  Lymphatics: Non tender without lymphadenopathy.   Genitourinary: defer Musculoskeletal: Full ROM all peripheral extremities,5/5 strength, and normal gait.  Skin: Warm, dry without rashes, lesions, ecchymosis. Neuro: Cranial nerves intact, reflexes equal bilaterally. Normal muscle tone, no cerebellar symptoms. Sensation intact.  Psych: Awake and oriented X 3, normal affect, Insight and Judgment appropriate.   EKG: WNL no changes. AORTA SCAN: defer  Quentin MullingAmanda Vergia Chea 10:29 AM [facility]

## 2018-08-22 ENCOUNTER — Ambulatory Visit (INDEPENDENT_AMBULATORY_CARE_PROVIDER_SITE_OTHER): Payer: Self-pay | Admitting: Physician Assistant

## 2018-08-22 ENCOUNTER — Encounter: Payer: Self-pay | Admitting: Physician Assistant

## 2018-08-22 VITALS — BP 148/90 | HR 63 | Temp 97.3°F | Resp 18 | Ht 72.0 in | Wt 244.0 lb

## 2018-08-22 DIAGNOSIS — E039 Hypothyroidism, unspecified: Secondary | ICD-10-CM

## 2018-08-22 DIAGNOSIS — E559 Vitamin D deficiency, unspecified: Secondary | ICD-10-CM

## 2018-08-22 DIAGNOSIS — I2510 Atherosclerotic heart disease of native coronary artery without angina pectoris: Secondary | ICD-10-CM

## 2018-08-22 DIAGNOSIS — R002 Palpitations: Secondary | ICD-10-CM

## 2018-08-22 DIAGNOSIS — F411 Generalized anxiety disorder: Secondary | ICD-10-CM

## 2018-08-22 DIAGNOSIS — I10 Essential (primary) hypertension: Secondary | ICD-10-CM

## 2018-08-22 DIAGNOSIS — Z0001 Encounter for general adult medical examination with abnormal findings: Secondary | ICD-10-CM

## 2018-08-22 DIAGNOSIS — J449 Chronic obstructive pulmonary disease, unspecified: Secondary | ICD-10-CM

## 2018-08-22 DIAGNOSIS — Z86711 Personal history of pulmonary embolism: Secondary | ICD-10-CM

## 2018-08-22 DIAGNOSIS — J439 Emphysema, unspecified: Secondary | ICD-10-CM

## 2018-08-22 DIAGNOSIS — K219 Gastro-esophageal reflux disease without esophagitis: Secondary | ICD-10-CM

## 2018-08-22 DIAGNOSIS — Z79899 Other long term (current) drug therapy: Secondary | ICD-10-CM

## 2018-08-22 DIAGNOSIS — I719 Aortic aneurysm of unspecified site, without rupture: Secondary | ICD-10-CM

## 2018-08-22 DIAGNOSIS — R918 Other nonspecific abnormal finding of lung field: Secondary | ICD-10-CM

## 2018-08-22 DIAGNOSIS — R7309 Other abnormal glucose: Secondary | ICD-10-CM

## 2018-08-22 DIAGNOSIS — E782 Mixed hyperlipidemia: Secondary | ICD-10-CM

## 2018-08-22 DIAGNOSIS — I7121 Aneurysm of the ascending aorta, without rupture: Secondary | ICD-10-CM

## 2018-08-22 DIAGNOSIS — L409 Psoriasis, unspecified: Secondary | ICD-10-CM

## 2018-08-22 MED ORDER — PREDNISONE 20 MG PO TABS: ORAL_TABLET | ORAL | 0 refills | Status: AC

## 2018-08-22 NOTE — Patient Instructions (Addendum)
Will do trelegy samples  Will try to get sleep study  Try albuterol 20-60 mins before bed  Prop up your bed at night or sleep in recliner.   Do a vinegar tsp with warm water and peroxide gargle  Make sure you are on an allergy pill, see below for more details.  Please take the prednisone to help decrease inflammation and therefore decrease symptoms. Take it it with food to avoid GI upset. It can cause increased energy but on the other hand it can make it hard to sleep at night so please take it AT NIGHT WITH DINNER, it takes 8-12 hours to start working so it will NOT affect your sleeping if you take it at night with your food!!  If you are diabetic it will increase your sugars so decrease carbs and monitor your sugars closely.     Check out  Mini habits for weight loss book  2 apps for tracking food is myfitness pal  loseit OR can take picture of your food  Are you an emotional eater? Do you eat more when you're feeling stressed? Do you eat when you're not hungry or when you're full? Do you eat to feel better (to calm and soothe yourself when you're sad, mad, bored, anxious, etc.)? Do you reward yourself with food? Do you regularly eat until you've stuffed yourself? Does food make you feel safe? Do you feel like food is a friend? Do you feel powerless or out of control around food?  If you answered yes to some of these questions than it is likely that you are an emotional eater. This is normally a learned behavior and can take time to first recognize the signs and second BREAK THE HABIT. But here is more information and tips to help.   The difference between emotional hunger and physical hunger Emotional hunger can be powerful, so it's easy to mistake it for physical hunger. But there are clues you can look for to help you tell physical and emotional hunger apart.  Emotional hunger comes on suddenly. It hits you in an instant and feels overwhelming and urgent. Physical  hunger, on the other hand, comes on more gradually. The urge to eat doesn't feel as dire or demand instant satisfaction (unless you haven't eaten for a very long time).  Emotional hunger craves specific comfort foods. When you're physically hungry, almost anything sounds good-including healthy stuff like vegetables. But emotional hunger craves junk food or sugary snacks that provide an instant rush. You feel like you need cheesecake or pizza, and nothing else will do.  Emotional hunger often leads to mindless eating. Before you know it, you've eaten a whole bag of chips or an entire pint of ice cream without really paying attention or fully enjoying it. When you're eating in response to physical hunger, you're typically more aware of what you're doing.  Emotional hunger isn't satisfied once you're full. You keep wanting more and more, often eating until you're uncomfortably stuffed. Physical hunger, on the other hand, doesn't need to be stuffed. You feel satisfied when your stomach is full.  Emotional hunger isn't located in the stomach. Rather than a growling belly or a pang in your stomach, you feel your hunger as a craving you can't get out of your head. You're focused on specific textures, tastes, and smells.  Emotional hunger often leads to regret, guilt, or shame. When you eat to satisfy physical hunger, you're unlikely to feel guilty or ashamed because you're  simply giving your body what it needs. If you feel guilty after you eat, it's likely because you know deep down that you're not eating for nutritional reasons.  Identify your emotional eating triggers What situations, places, or feelings make you reach for the comfort of food? Most emotional eating is linked to unpleasant feelings, but it can also be triggered by positive emotions, such as rewarding yourself for achieving a goal or celebrating a holiday or happy event. Common causes of emotional eating include:  Stuffing emotions - Eating  can be a way to temporarily silence or "stuff down" uncomfortable emotions, including anger, fear, sadness, anxiety, loneliness, resentment, and shame. While you're numbing yourself with food, you can avoid the difficult emotions you'd rather not feel.  Boredom or feelings of emptiness - Do you ever eat simply to give yourself something to do, to relieve boredom, or as a way to fill a void in your life? You feel unfulfilled and empty, and food is a way to occupy your mouth and your time. In the moment, it fills you up and distracts you from underlying feelings of purposelessness and dissatisfaction with your life.  Childhood habits - Think back to your childhood memories of food. Did your parents reward good behavior with ice cream, take you out for pizza when you got a good report card, or serve you sweets when you were feeling sad? These habits can often carry over into adulthood. Or your eating may be driven by nostalgia-for cherished memories of grilling burgers in the backyard with your dad or baking and eating cookies with your mom.  Social influences - Getting together with other people for a meal is a great way to relieve stress, but it can also lead to overeating. It's easy to overindulge simply because the food is there or because everyone else is eating. You may also overeat in social situations out of nervousness. Or perhaps your family or circle of friends encourages you to overeat, and it's easier to go along with the group.  Stress - Ever notice how stress makes you hungry? It's not just in your mind. When stress is chronic, as it so often is in our chaotic, fast-paced world, your body produces high levels of the stress hormone, cortisol. Cortisol triggers cravings for salty, sweet, and fried foods-foods that give you a burst of energy and pleasure. The more uncontrolled stress in your life, the more likely you are to turn to food for emotional relief.  Find other ways to feed your  feelings If you don't know how to manage your emotions in a way that doesn't involve food, you won't be able to control your eating habits for very long. Diets so often fail because they offer logical nutritional advice which only works if you have conscious control over your eating habits. It doesn't work when emotions hijack the process, demanding an immediate payoff with food.  In order to stop emotional eating, you have to find other ways to fulfill yourself emotionally. It's not enough to understand the cycle of emotional eating or even to understand your triggers, although that's a huge first step. You need alternatives to food that you can turn to for emotional fulfillment.  Alternatives to emotional eating If you're depressed or lonely, call someone who always makes you feel better, play with your dog or cat, or look at a favorite photo or cherished memento.  If you're anxious, expend your nervous energy by dancing to your favorite song, squeezing a stress ball,  or taking a brisk walk.  If you're exhausted, treat yourself with a hot cup of tea, take a bath, light some scented candles, or wrap yourself in a warm blanket.  If you're bored, read a good book, watch a comedy show, explore the outdoors, or turn to an activity you enjoy (woodworking, playing the guitar, shooting hoops, scrapbooking, etc.).  What is mindful eating? Mindful eating is a practice that develops your awareness of eating habits and allows you to pause between your triggers and your actions. Most emotional eaters feel powerless over their food cravings. When the urge to eat hits, you feel an almost unbearable tension that demands to be fed, right now. Because you've tried to resist in the past and failed, you believe that your willpower just isn't up to snuff. But the truth is that you have more power over your cravings than you think.  Take 5 before you give in to a craving Emotional eating tends to be automatic and  virtually mindless. Before you even realize what you're doing, you've reached for a tub of ice cream and polished off half of it. But if you can take a moment to pause and reflect when you're hit with a craving, you give yourself the opportunity to make a different decision.  Can you put off eating for five minutes? Or just start with one minute. Don't tell yourself you can't give in to the craving; remember, the forbidden is extremely tempting. Just tell yourself to wait.  While you're waiting, check in with yourself. How are you feeling? What's going on emotionally? Even if you end up eating, you'll have a better understanding of why you did it. This can help you set yourself up for a different response next time.  How to practice mindful eating Eating while you're also doing other things-such as watching TV, driving, or playing with your phone-can prevent you from fully enjoying your food. Since your mind is elsewhere, you may not feel satisfied or continue eating even though you're no longer hungry. Eating more mindfully can help focus your mind on your food and the pleasure of a meal and curb overeating.   Eat your meals in a calm place with no distractions, aside from any dining companions.  Try eating with your non-dominant hand or using chopsticks instead of a knife and fork. Eating in such a non-familiar way can slow down how fast you eat and ensure your mind stays focused on your food.  Allow yourself enough time not to have to rush your meal. Set a timer for 20 minutes and pace yourself so you spend at least that much time eating.  Take small bites and chew them well, taking time to notice the different flavors and textures of each mouthful.  Put your utensils down between bites. Take time to consider how you feel-hungry, satiated-before picking up your utensils again.  Try to stop eating before you are full.It takes time for the signal to reach your brain that you've had enough. Don't  feel obligated to always clean your plate.  When you've finished your food, take a few moments to assess if you're really still hungry before opting for an extra serving or dessert.  Learn to accept your feelings-even the bad ones  While it may seem that the core problem is that you're powerless over food, emotional eating actually stems from feeling powerless over your emotions. You don't feel capable of dealing with your feelings head on, so you avoid them with  food.  Recommended reading  Mini Habits for weight loss  Healthy Eating: A guide to the new nutrition Copy Special Health Report  10 Tips for Mindful Eating - How mindfulness can help you fully enjoy a meal and the experience of eating-with moderation and restraint. [facility] blog)  Weight Loss: Gain Control of Emotional Eating - Tips to regain control of your eating habits. [facility])  Why Stress Causes People to Overeat -Tips on controlling stress eating. [employer])  Mindful Eating Meditations -Free online mindfulness meditations. ([facility] [employer]ful [employer])    Monitor your blood pressure at home, please keep a record and bring that in with you to your next office visit.   Go to the ER if any CP, SOB, nausea, dizziness, severe HA, changes vision/speech  Due to a recent study, SPRINT, we have changed our goal for the systolic or top blood pressure number. Ideally we want your top number at 120.  In the [facility] Trial, 5000 people were randomized to a goal BP of 120 and 5000 people were randomized to a goal BP of less than 140. The patients with the goal BP at 120 had LESS DEMENTIA, LESS HEART ATTACKS, AND LESS STROKES, AS WELL AS OVERALL DECREASED MORTALITY OR DEATH RATE.   If you are willing, our goal BP is the top number of 120.  Your most recent BP: BP: (!) 148/90   Take your medications faithfully as instructed. Maintain a healthy weight. Get at least 150 minutes of  aerobic exercise per week. Minimize salt intake. Minimize alcohol intake  DASH Eating Plan DASH stands for "Dietary Approaches to Stop Hypertension." The DASH eating plan is a healthy eating plan that has been shown to reduce high blood pressure (hypertension). Additional health benefits may include reducing the risk of type 2 diabetes mellitus, heart disease, and stroke. The DASH eating plan may also help with weight loss. WHAT DO I NEED TO KNOW ABOUT THE DASH EATING PLAN? For the DASH eating plan, you will follow these general guidelines:  Choose foods with a percent daily value for sodium of less than 5% (as listed on the food label).  Use salt-free seasonings or herbs instead of table salt or sea salt.  Check with your health care provider or pharmacist before using salt substitutes.  Eat lower-sodium products, often labeled as "lower sodium" or "no salt added."  Eat fresh foods.  Eat more vegetables, fruits, and low-fat dairy products.  Choose whole grains. Look for the word "whole" as the first word in the ingredient list.  Choose fish and skinless chicken or [place] more often than red meat. Limit fish, poultry, and meat to 6 oz (170 g) each day.  Limit sweets, desserts, sugars, and sugary drinks.  Choose heart-healthy fats.  Limit cheese to 1 oz (28 g) per day.  Eat more home-cooked food and less restaurant, buffet, and fast food.  Limit fried foods.  Cook foods using methods other than frying.  Limit canned vegetables. If you do use them, rinse them well to decrease the sodium.  When eating at a restaurant, ask that your food be prepared with less salt, or no salt if possible. WHAT FOODS CAN I EAT? Seek help from a dietitian for individual calorie needs. Grains Whole grain or whole wheat bread. Brown rice. Whole grain or whole wheat pasta. Quinoa, bulgur, and whole grain cereals. Low-sodium cereals. Corn or whole wheat flour tortillas. Whole grain cornbread. Whole  grain crackers.  Low-sodium crackers. Vegetables Fresh or frozen vegetables (raw, steamed, roasted, or grilled). Low-sodium or reduced-sodium tomato and vegetable juices. Low-sodium or reduced-sodium tomato sauce and paste. Low-sodium or reduced-sodium canned vegetables.  Fruits All fresh, canned (in natural juice), or frozen fruits. Meat and Other Protein Products Ground beef (85% or leaner), grass-fed beef, or beef trimmed of fat. Skinless chicken or [place]. Ground chicken or [place]. Pork trimmed of fat. All fish and seafood. Eggs. Dried beans, peas, or lentils. Unsalted nuts and seeds. Unsalted canned beans. Dairy Low-fat dairy products, such as skim or 1% milk, 2% or reduced-fat cheeses, low-fat ricotta or cottage cheese, or plain low-fat yogurt. Low-sodium or reduced-sodium cheeses. Fats and Oils Tub margarines without trans fats. Light or reduced-fat mayonnaise and salad dressings (reduced sodium). Avocado. Safflower, olive, or canola oils. Natural peanut or almond butter. Other Unsalted popcorn and pretzels. The items listed above may not be a complete list of recommended foods or beverages. Contact your dietitian for more options. WHAT FOODS ARE NOT RECOMMENDED? Grains White bread. White pasta. White rice. Refined cornbread. Bagels and croissants. Crackers that contain trans fat. Vegetables Creamed or fried vegetables. Vegetables in a cheese sauce. Regular canned vegetables. Regular canned tomato sauce and paste. Regular tomato and vegetable juices. Fruits Dried fruits. Canned fruit in light or heavy syrup. Fruit juice. Meat and Other Protein Products Fatty cuts of meat. Ribs, chicken wings, bacon, sausage, bologna, salami, chitterlings, fatback, hot dogs, bratwurst, and packaged luncheon meats. Salted nuts and seeds. Canned beans with salt. Dairy Whole or 2% milk, cream, half-and-half, and cream cheese. Whole-fat or sweetened yogurt. Full-fat cheeses or blue cheese. Nondairy creamers  and whipped toppings. Processed cheese, cheese spreads, or cheese curds. Condiments Onion and garlic salt, seasoned salt, table salt, and sea salt. Canned and packaged gravies. Worcestershire sauce. Tartar sauce. Barbecue sauce. Teriyaki sauce. Soy sauce, including reduced sodium. Steak sauce. Fish sauce. Oyster sauce. Cocktail sauce. Horseradish. Ketchup and mustard. Meat flavorings and tenderizers. Bouillon cubes. Hot sauce. Tabasco sauce. Marinades. Taco seasonings. Relishes. Fats and Oils Butter, stick margarine, lard, shortening, ghee, and bacon fat. Coconut, palm kernel, or palm oils. Regular salad dressings. Other Pickles and olives. Salted popcorn and pretzels. The items listed above may not be a complete list of foods and beverages to avoid. Contact your dietitian for more information. WHERE CAN I FIND MORE INFORMATION? National Heart, Lung, and Blood Institute: [employer] Document Released: 11/30/2011 Document Revised: 04/27/2014 Document Reviewed: 10/15/2013 [facility] Patient Information 2015 [place], [place]. This information is not intended to replace advice given to you by your health care provider. Make sure you discuss any questions you have with your health care provider.

## 2018-08-23 LAB — CBC WITH DIFFERENTIAL/PLATELET
BASOS PCT: 1.1 %
Basophils Absolute: 89 cells/uL (ref 0–200)
EOS ABS: 308 {cells}/uL (ref 15–500)
Eosinophils Relative: 3.8 %
HCT: 36.1 % — ABNORMAL LOW (ref 38.5–50.0)
HEMOGLOBIN: 12.2 g/dL — AB (ref 13.2–17.1)
Lymphs Abs: 1669 cells/uL (ref 850–3900)
MCH: 32.7 pg (ref 27.0–33.0)
MCHC: 33.8 g/dL (ref 32.0–36.0)
MCV: 96.8 fL (ref 80.0–100.0)
MONOS PCT: 13.1 %
MPV: 9.3 fL (ref 7.5–12.5)
NEUTROS ABS: 4973 {cells}/uL (ref 1500–7800)
Neutrophils Relative %: 61.4 %
Platelets: 326 10*3/uL (ref 140–400)
RBC: 3.73 10*6/uL — ABNORMAL LOW (ref 4.20–5.80)
RDW: 13.1 % (ref 11.0–15.0)
Total Lymphocyte: 20.6 %
WBC mixed population: 1061 cells/uL — ABNORMAL HIGH (ref 200–950)
WBC: 8.1 10*3/uL (ref 3.8–10.8)

## 2018-08-23 LAB — COMPLETE METABOLIC PANEL WITH GFR
AG Ratio: 1.6 (calc) (ref 1.0–2.5)
ALBUMIN MSPROF: 4.2 g/dL (ref 3.6–5.1)
ALT: 45 U/L (ref 9–46)
AST: 31 U/L (ref 10–35)
Alkaline phosphatase (APISO): 86 U/L (ref 40–115)
BUN: 21 mg/dL (ref 7–25)
CALCIUM: 10.2 mg/dL (ref 8.6–10.3)
CO2: 29 mmol/L (ref 20–32)
CREATININE: 0.94 mg/dL (ref 0.70–1.33)
Chloride: 103 mmol/L (ref 98–110)
GFR, EST NON AFRICAN AMERICAN: 88 mL/min/{1.73_m2} (ref >=60)
GFR, Est African American: 102 mL/min/{1.73_m2} (ref >=60)
GLOBULIN: 2.7 g/dL (ref 1.9–3.7)
GLUCOSE: 86 mg/dL (ref 65–99)
Potassium: 5.5 mmol/L — ABNORMAL HIGH (ref 3.5–5.3)
Sodium: 140 mmol/L (ref 135–146)
Total Bilirubin: 0.5 mg/dL (ref 0.2–1.2)
Total Protein: 6.9 g/dL (ref 6.1–8.1)

## 2018-08-23 LAB — TSH: TSH: 17.98 m[IU]/L — AB (ref 0.40–4.50)

## 2018-09-03 ENCOUNTER — Other Ambulatory Visit: Payer: Self-pay | Admitting: Adult Health

## 2018-09-03 DIAGNOSIS — F411 Generalized anxiety disorder: Secondary | ICD-10-CM

## 2018-09-04 ENCOUNTER — Encounter: Payer: Self-pay | Admitting: Physician Assistant

## 2018-09-26 ENCOUNTER — Other Ambulatory Visit: Payer: Self-pay | Admitting: *Deleted

## 2018-09-26 MED ORDER — ATORVASTATIN CALCIUM 40 MG PO TABS: 40.0000 mg | ORAL_TABLET | Freq: Every day | ORAL | 0 refills | Status: DC

## 2018-10-01 ENCOUNTER — Other Ambulatory Visit: Payer: Self-pay

## 2018-10-03 ENCOUNTER — Other Ambulatory Visit: Payer: Self-pay | Admitting: Physician Assistant

## 2018-10-03 DIAGNOSIS — F411 Generalized anxiety disorder: Secondary | ICD-10-CM

## 2018-10-03 MED ORDER — LORAZEPAM 1 MG PO TABS: ORAL_TABLET | ORAL | 0 refills | Status: DC

## 2018-10-25 ENCOUNTER — Other Ambulatory Visit: Payer: Self-pay

## 2018-10-25 MED ORDER — LEVOTHYROXINE SODIUM 100 MCG PO TABS: ORAL_TABLET | ORAL | 0 refills | Status: DC

## 2018-10-30 ENCOUNTER — Other Ambulatory Visit: Payer: Self-pay

## 2018-10-31 ENCOUNTER — Other Ambulatory Visit: Payer: Self-pay | Admitting: Physician Assistant

## 2018-10-31 DIAGNOSIS — F411 Generalized anxiety disorder: Secondary | ICD-10-CM

## 2018-11-22 ENCOUNTER — Other Ambulatory Visit: Payer: Self-pay | Admitting: Internal Medicine

## 2018-12-02 ENCOUNTER — Other Ambulatory Visit: Payer: Self-pay | Admitting: Physician Assistant

## 2018-12-02 DIAGNOSIS — F411 Generalized anxiety disorder: Secondary | ICD-10-CM

## 2018-12-23 ENCOUNTER — Other Ambulatory Visit: Payer: Self-pay | Admitting: Physician Assistant

## 2019-01-02 ENCOUNTER — Other Ambulatory Visit: Payer: Self-pay | Admitting: Physician Assistant

## 2019-01-02 DIAGNOSIS — F411 Generalized anxiety disorder: Secondary | ICD-10-CM

## 2019-01-17 ENCOUNTER — Telehealth: Payer: Self-pay | Admitting: Physician Assistant

## 2019-01-17 MED ORDER — PREDNISONE 20 MG PO TABS: ORAL_TABLET | ORAL | 0 refills | Status: DC

## 2019-01-17 MED ORDER — AZITHROMYCIN 250 MG PO TABS: ORAL_TABLET | ORAL | 1 refills | Status: AC

## 2019-01-17 MED ORDER — BENZONATATE 200 MG PO CAPS: 200.0000 mg | ORAL_CAPSULE | Freq: Three times a day (TID) | ORAL | 0 refills | Status: DC | PRN

## 2019-01-17 NOTE — Telephone Encounter (Signed)
LVM informing pt of rx sent

## 2019-01-17 NOTE — Telephone Encounter (Signed)
58 y.o. male WITH HISTORY OF COPD calls with 10 days of URI symptoms. He is on MUCINEX.  Symptoms include sore throat and productive cough  Problem list has Mixed hyperlipidemia; Essential hypertension; History of pulmonary embolus (PE); GERD; Multiple lung nodules on CT; Asthma with chronic obstructive pulmonary disease (COPD) (HCC); Psoriasis; CAD (coronary artery disease); Aortic root aneurysm (HCC); Abnormal glucose; Vitamin D deficiency; Medication management; Hypothyroidism; Generalized anxiety disorder; Pulmonary emphysema (HCC); and Palpitations on their problem list.  Medications Current Outpatient Medications on File Prior to Visit  Medication Sig  . amLODipine-olmesartan (AZOR) 5-40 MG tablet TAKE ONE TABLET BY MOUTH DAILY  . atorvastatin (LIPITOR) 40 MG tablet TAKE ONE TABLET BY MOUTH DAILY  . bisoprolol-hydrochlorothiazide (ZIAC) 10-6.25 MG tablet Take 1 tablet by mouth daily.  . clobetasol ointment (TEMOVATE) 0.05 % Apply as needed  . Flaxseed, Linseed, (EQL FLAX SEED OIL) 1000 MG CAPS Take 1,000 mg by mouth daily.  . Fluticasone-Umeclidin-Vilant (TRELEGY ELLIPTA) 100-62.5-25 MCG/INH AEPB Inhale 1 puff into the lungs daily.  Marland Kitchen HUMIRA PEN 40 MG/0.8ML PNKT   . levalbuterol (XOPENEX HFA) 45 MCG/ACT inhaler Inhale 2 puffs into the lungs every 6 (six) hours as needed for wheezing or shortness of breath.  . levothyroxine (SYNTHROID, LEVOTHROID) 100 MCG tablet TAKE 1 TABLET BY MOUTH ONCE DAILY  . LORazepam (ATIVAN) 1 MG tablet TAKE ONE TABLET BY MOUTH TWICE A DAY AS NEEDED FOR ANXIETY *TRY NOT TO TAKE TWO A DAY*  . Multiple Vitamins-Minerals (MULTIVITAMIN WITH MINERALS) tablet Take 1 tablet by mouth daily.  . Omega-3 Fatty Acids (FISH OIL) 1000 MG CAPS Take 1,000 mg by mouth daily.  Marland Kitchen venlafaxine XR (EFFEXOR-XR) 75 MG 24 hr capsule TAKE 1 TO 2 CAPSULES BY MOUTH EVERY DAY FOR ANXIETY   No current facility-administered medications on file prior to visit.     Allergies  Allergen  Reactions  . Ace Inhibitors     cough  . Haldol [Haloperidol Lactate]     psychosis  . Haloperidol Lactate     REACTION: phsycotic break    I have prescribed I have prescribed Azithromyin 250 mg: two tablets now and then one tablet daily for 4 additonal days.  Make sure you are on an allergy pill such as claritin, allegra or zyrtec.  You may use an oral decongestant such as Mucinex D or if you have glaucoma or high blood pressure use plain Mucinex.  This is cough meds that you can take: A prescription cough medication called Tessalon Perles 100mg . You may take 1-2 capsules every 8 hours as needed for your cough.  If you develop worsening sinus pain, fever or notice severe headache and vision changes, or if symptoms are not better after completion of antibiotic, please schedule an appointment with a health care provider. If you are getting worse please go to the ER.

## 2019-01-23 ENCOUNTER — Ambulatory Visit (INDEPENDENT_AMBULATORY_CARE_PROVIDER_SITE_OTHER): Payer: Self-pay | Admitting: Physician Assistant

## 2019-01-23 ENCOUNTER — Ambulatory Visit: Payer: Self-pay | Admitting: Physician Assistant

## 2019-01-23 ENCOUNTER — Encounter: Payer: Self-pay | Admitting: Physician Assistant

## 2019-01-23 VITALS — BP 132/70 | HR 87 | Temp 97.2°F | Ht 72.0 in | Wt 240.0 lb

## 2019-01-23 DIAGNOSIS — I10 Essential (primary) hypertension: Secondary | ICD-10-CM

## 2019-01-23 DIAGNOSIS — E782 Mixed hyperlipidemia: Secondary | ICD-10-CM

## 2019-01-23 DIAGNOSIS — Z79899 Other long term (current) drug therapy: Secondary | ICD-10-CM

## 2019-01-23 DIAGNOSIS — J449 Chronic obstructive pulmonary disease, unspecified: Secondary | ICD-10-CM

## 2019-01-23 DIAGNOSIS — E039 Hypothyroidism, unspecified: Secondary | ICD-10-CM

## 2019-01-23 DIAGNOSIS — J439 Emphysema, unspecified: Secondary | ICD-10-CM

## 2019-01-23 MED ORDER — PREDNISONE 20 MG PO TABS: ORAL_TABLET | ORAL | 0 refills | Status: DC

## 2019-01-23 NOTE — Patient Instructions (Signed)
Chronic Obstructive Pulmonary Disease Exacerbation    Chronic obstructive pulmonary disease (COPD) is a long-term (chronic) condition that affects the lungs. COPD is a general term that can be used to describe many different lung problems that cause lung swelling (inflammation) and limit airflow, including chronic bronchitis and emphysema. COPD exacerbations are episodes when breathing symptoms become much worse and require extra treatment.  COPD exacerbations are usually caused by infections. Without treatment, COPD exacerbations can be severe and even life threatening. Frequent COPD exacerbations can cause further damage to the lungs.  What are the causes?  This condition may be caused by:  · Respiratory infections, including viral and bacterial infections.  · Exposure to smoke.  · Exposure to air pollution, chemical fumes, or dust.  · Things that give you an allergic reaction (allergens).  · Not taking your usual COPD medicines as directed.  · Underlying medical problems, such as congestive heart failure or infections not involving the lungs.  In many cases, the cause (trigger) of this condition is not known.  What increases the risk?  The following factors may make you more likely to develop this condition:  · Smoking cigarettes.  · Old age.  · Frequent prior COPD exacerbations.  What are the signs or symptoms?  Symptoms of this condition include:  · Increased coughing.  · Increased production of mucus from your lungs (sputum).  · Increased wheezing.  · Increased shortness of breath.  · Rapid or labored breathing.  · Chest tightness.  · Less energy than usual.  · Sleep disruption from symptoms.  · Confusion or increased sleepiness.  Often these symptoms happen or get worse even with the use of medicines.  How is this diagnosed?  This condition is diagnosed based on:  · Your medical history.  · A physical exam.  You may also have tests, including:  · A chest X-ray.  · Blood tests.  · Lung (pulmonary) function  tests.  How is this treated?  Treatment for this condition depends on the severity and cause of the symptoms. You may need to be admitted to a hospital for treatment. Some of the treatments commonly used to treat COPD exacerbations are:  · Antibiotic medicines. These may be used for severe exacerbations caused by a lung infection, such as pneumonia.  · Bronchodilators. These are inhaled medicines that expand the air passages and allow increased airflow.  · Steroid medicines. These act to reduce inflammation in the airways. They may be given with an inhaler, taken by mouth, or given through an IV tube inserted into one of your veins.  · Supplemental oxygen therapy.  · Airway clearing techniques, such as noninvasive ventilation (NIV) and positive expiratory pressure (PEP). These provide respiratory support through a mask or other noninvasive device. An example of this would be using a continuous positive airway pressure (CPAP) machine to improve delivery of oxygen into your lungs.  Follow these instructions at home:  Medicines  · Take over-the-counter and prescription medicines only as told by your health care provider. It is important to use correct technique with inhaled medicines.  · If you were prescribed an antibiotic medicine or oral steroid, take it as told by your health care provider. Do not stop taking the medicine even if you start to feel better.  Lifestyle  · Eat a healthy diet.  · Exercise regularly.  · Get plenty of sleep.  · Avoid exposure to all substances that irritate the airway, especially to tobacco smoke.  · Wash   your hands often with soap and water to reduce the risk of infection. If soap and water are not available, use hand sanitizer.  · During flu season, avoid enclosed spaces that are crowded with people.  General instructions  · Drink enough fluid to keep your urine clear or pale yellow (unless you have a medical condition that requires fluid restriction).  · Use a cool mist vaporizer. This  humidifies the air and makes it easier for you to clear your chest when you cough.  · If you have a home nebulizer and oxygen, continue to use them as told by your health care provider.  · Keep all follow-up visits as told by your health care provider. This is important.  How is this prevented?  · Stay up-to-date on pneumococcal and influenza (flu) vaccines. A flu shot is recommended every year to help prevent exacerbations.  · Do not use any products that contain nicotine or tobacco, such as cigarettes and e-cigarettes. Quitting smoking is very important in preventing COPD from getting worse and in preventing exacerbations from happening as often. If you need help quitting, ask your health care provider.  · Follow all instructions for pulmonary rehabilitation after a recent exacerbation. This can help prevent future exacerbations.  · Work with your health care provider to develop and follow an action plan. This tells you what steps to take when you experience certain symptoms.  Contact a health care provider if:  · You have a worsening of your regular COPD symptoms.  Get help right away if:  · You have worsening shortness of breath, even when resting.  · You have trouble talking.  · You have severe chest pain.  · You cough up blood.  · You have a fever.  · You have weakness, vomit repeatedly, or faint.  · You feel confused.  · You are not able to sleep because of your symptoms.  · You have trouble doing daily activities.  Summary  · COPD exacerbations are episodes when breathing symptoms become much worse and require extra treatment above your normal treatment.  · Exacerbations can be severe and even life threatening. Frequent COPD exacerbations can cause further damage to your lungs.  · COPD exacerbations are usually triggered by infections such as the flu, colds, and even pneumonia.  · Treatment for this condition depends on the severity and cause of the symptoms. You may need to be admitted to a hospital for  treatment.  · Quitting smoking is very important to prevent COPD from getting worse and to prevent exacerbations from happening as often.  This information is not intended to replace advice given to you by your health care provider. Make sure you discuss any questions you have with your health care provider.  Document Released: 10/08/2007 Document Revised: 06/06/2017 Document Reviewed: 01/15/2017  Elsevier Interactive Patient Education © 2019 [employer]

## 2019-01-23 NOTE — Progress Notes (Signed)
Patient ID: Samuel BeamGene M Sharp, male   DOB: 04/25/1961, 58 y.o.   MRN: [id number]  Assessment and Plan:  Hypertension:  -Continue medication, azor samples given -monitor blood pressure at home.  -Continue DASH diet.   -Reminder to go to the ER if any CP, SOB, nausea, dizziness, severe HA, changes vision/speech, left arm numbness and tingling, and jaw pain.  Cholesterol: -Continue diet and exercise.  -Check cholesterol   Hypothyroidism -check TSH level, continue medications the same, reminded to take on an empty stomach 30-6660mins before food.   Obesity with co morbidities - long discussion about weight loss, diet, and exercise  COPD -currently well controlled  -trelegy continue it- samples given prenidosne sent in for exacerbation Had CT chest 05/2017- needs repeat- OVER DUE  Anxiety Ativan 1mg  BID no more than than at a time, not taking daily, will add on effexor to see if we can try to decrease ativan use, intolerant to zoloft Increase effexor to 75mg  daily  Dehydration Check labs  Continue diet and meds as discussed. Further disposition pending results of labs. No future appointments.  HPI 58 y.o. male  presents for 3 month follow up with hypertension, hyperlipidemia, prediabetes and vitamin D.   His blood pressure has been controlled at home- has been 120-130, today their BP is BP: 132/70.   He does workout. He denies chest pain, shortness of breath, dizziness.   He has asthma/COPD, he has been out of trilegy. He is on xopenex.    He has anxiety that affects his breathing, he has done a great job of cutting back from 2 mg TID to 1 mg BID, will continue this until we are able to find a daily anxiety medication that helps and hopefully at that time he will start taking the valium AS needed BID.  Patient always has "dehydration" on BUN/Cret, but states he drinks 6-7 64 oz glasses of water a day. Will get osmolality studies.   He is on thyroid medication. His medication was not  changed last visit however, 1 pill daily at night in the middle of the night.   Lab Results  Component Value Date   TSH 17.98 (H) 08/22/2018    He is on cholesterol medication and denies myalgias. His cholesterol is at goal. The cholesterol last visit was:   Lab Results  Component Value Date   CHOL 183 02/21/2018   HDL 49 02/21/2018   LDLCALC 112 (H) 02/21/2018   TRIG 111 02/21/2018   CHOLHDL 3.7 02/21/2018    He has been working on diet and exercise for prediabetes, and denies foot ulcerations, hyperglycemia, hypoglycemia , increased appetite, nausea, paresthesia of the feet, polydipsia, polyuria, visual disturbances, vomiting and weight loss. Last A1C in the office was:  Lab Results  Component Value Date   HGBA1C 5.0 06/21/2017   Patient is on Vitamin D supplement.  Lab Results  Component Value Date   VD25OH 75 06/21/2017     BMI is Body mass index is 32.55 kg/m., he is working on diet and exercise. Wt Readings from Last 3 Encounters:  01/23/19 240 lb (108.9 kg)  08/22/18 244 lb (110.7 kg)  07/15/18 224 lb 3.2 oz (101.7 kg)   Current Medications:  Current Outpatient Medications on File Prior to Visit  Medication Sig Dispense Refill  . amLODipine-olmesartan (AZOR) 5-40 MG tablet TAKE ONE TABLET BY MOUTH DAILY 90 tablet 0  . atorvastatin (LIPITOR) 40 MG tablet TAKE ONE TABLET BY MOUTH DAILY 90 tablet 0  . bisoprolol-hydrochlorothiazide (  ZIAC) 10-6.25 MG tablet Take 1 tablet by mouth daily. 90 tablet 3  . clobetasol ointment (TEMOVATE) 0.05 % Apply as needed    . Flaxseed, Linseed, (EQL FLAX SEED OIL) 1000 MG CAPS Take 1,000 mg by mouth daily.    . Fluticasone-Umeclidin-Vilant (TRELEGY ELLIPTA) 100-62.5-25 MCG/INH AEPB Inhale 1 puff into the lungs daily. 60 each 5  . HUMIRA PEN 40 MG/0.8ML PNKT     . levalbuterol (XOPENEX HFA) 45 MCG/ACT inhaler Inhale 2 puffs into the lungs every 6 (six) hours as needed for wheezing or shortness of breath. 1 Inhaler 12  . levothyroxine  (SYNTHROID, LEVOTHROID) 100 MCG tablet TAKE 1 TABLET BY MOUTH ONCE DAILY 90 tablet 0  . LORazepam (ATIVAN) 1 MG tablet TAKE ONE TABLET BY MOUTH TWICE A DAY AS NEEDED FOR ANXIETY *TRY NOT TO TAKE TWO A DAY* 60 tablet 0  . Multiple Vitamins-Minerals (MULTIVITAMIN WITH MINERALS) tablet Take 1 tablet by mouth daily.    . Omega-3 Fatty Acids (FISH OIL) 1000 MG CAPS Take 1,000 mg by mouth daily.     No current facility-administered medications on file prior to visit.     Medical History:  Past Medical History:  Diagnosis Date  . Asthma    Present all life. Dx as child. Been in [facility] since 2003--pft 06/03/10 FEV1 3.14/ 82%, FEV1/FVC 0.54; insig resp to BD, DLCO 75%, ni lUNG  Vol  . COPD (chronic obstructive pulmonary disease) (HCC)   . Detached retina   . History of TB skin testing    negative 2005-2010  . Hyperlipidemia   . Hypertension    on lisinopril 2002/2003--changed ARB march 2011  . Lung nodule    5mm on ct 11/10/10- likely lymph node  . Palpitations    started atenolol since 2003. recollects hx of holter. 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 informed it "Not a dangerous condition"-changed to lopressor 12/15/2010  . Psoriasis    on enbre;l since 005. started in [place]. Stopped 2011 by [place] derm due to nodule on chest  . Pulmonary embolism (HCC)    does not recollect tpA or being on lot of o2 or hypotension. 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 Ace Inhibitors     cough  . Haldol [Haloperidol Lactate]     psychosis  . Haloperidol Lactate     REACTION: phsycotic break     Review of Systems:  Review of Systems  Constitutional: Negative for chills, diaphoresis and fever.  HENT: Negative for congestion, ear pain and sore throat.   Eyes: Negative for blurred vision and double vision.  Respiratory: Negative for cough, shortness of breath and wheezing.   Cardiovascular: Positive for  palpitations. Negative for chest pain and leg swelling.  Gastrointestinal: Positive for heartburn. Negative for blood in stool, constipation, diarrhea and melena.  Genitourinary: Negative.   Neurological: Negative for headaches.    Family history- Review and unchanged  Social history- Review and unchanged  Physical Exam: BP 132/70   Pulse 87   Temp (!) 97.2 F (36.2 C)   Ht 6' (1.829 m)   Wt 240 lb (108.9 kg)   SpO2 94%   BMI 32.55 kg/m  Wt Readings from Last 3 Encounters:  01/23/19 240 lb (108.9 kg)  08/22/18 244 lb (110.7 kg)  07/15/18 224 lb 3.2 oz (101.7 kg)    General Appearance: Well nourished well developed, in no apparent  distress. Eyes: PERRLA, EOMs, conjunctiva no swelling or erythema ENT/Mouth: Ear canals normal without obstruction, swelling, erythma, discharge.  TMs normal bilaterally.  Oropharynx moist, clear, without exudate, or postoropharyngeal swelling. Neck: Supple, thyroid normal,no cervical adenopathy  Respiratory: Respiratory effort normal, Breath sounds clear A&P without rhonchi, wheeze, or rale.  No retractions, no accessory usage. Cardio: RRR with frequent PVC's, no MRGs. Brisk peripheral pulses without edema.  Abdomen: Soft, + BS,  Non tender, no guarding, rebound, hernias, masses. Musculoskeletal: Full ROM, 5/5 strength, Normal gait Skin: Warm, dry without rashes, lesions, ecchymosis.  Neuro: Awake and oriented X 3, Cranial nerves intact. Normal muscle tone, no cerebellar symptoms. Psych: Normal affect, Insight and Judgment appropriate.    Quentin Mulling, PA-C 4:00 PM [facility],

## 2019-01-24 LAB — COMPLETE METABOLIC PANEL WITH GFR
AG Ratio: 1.3 (calc) (ref 1.0–2.5)
ALT: 24 U/L (ref 9–46)
AST: 16 U/L (ref 10–35)
Albumin: 3.7 g/dL (ref 3.6–5.1)
Alkaline phosphatase (APISO): 70 U/L (ref 40–115)
BUN: 22 mg/dL (ref 7–25)
CO2: 26 mmol/L (ref 20–32)
Calcium: 9.9 mg/dL (ref 8.6–10.3)
Chloride: 102 mmol/L (ref 98–110)
Creat: 0.92 mg/dL (ref 0.70–1.33)
GFR, Est African American: 105 mL/min/{1.73_m2} (ref >=60)
GFR, Est Non African American: 91 mL/min/{1.73_m2} (ref >=60)
Globulin: 2.9 g/dL (calc) (ref 1.9–3.7)
Glucose, Bld: 77 mg/dL (ref 65–99)
Potassium: 4.7 mmol/L (ref 3.5–5.3)
Sodium: 140 mmol/L (ref 135–146)
Total Bilirubin: 0.5 mg/dL (ref 0.2–1.2)
Total Protein: 6.6 g/dL (ref 6.1–8.1)

## 2019-01-24 LAB — CBC WITH DIFFERENTIAL/PLATELET
Absolute Monocytes: 1416 cells/uL — ABNORMAL HIGH (ref 200–950)
BASOS ABS: 75 {cells}/uL (ref 0–200)
Basophils Relative: 0.5 %
Eosinophils Absolute: 447 cells/uL (ref 15–500)
Eosinophils Relative: 3 %
HCT: 38.6 % (ref 38.5–50.0)
HEMOGLOBIN: 13.4 g/dL (ref 13.2–17.1)
Lymphs Abs: 2205 cells/uL (ref 850–3900)
MCH: 32.4 pg (ref 27.0–33.0)
MCHC: 34.7 g/dL (ref 32.0–36.0)
MCV: 93.5 fL (ref 80.0–100.0)
MPV: 8.9 fL (ref 7.5–12.5)
Monocytes Relative: 9.5 %
NEUTROS ABS: 10758 {cells}/uL — AB (ref 1500–7800)
Neutrophils Relative %: 72.2 %
Platelets: 542 10*3/uL — ABNORMAL HIGH (ref 140–400)
RBC: 4.13 10*6/uL — ABNORMAL LOW (ref 4.20–5.80)
RDW: 11.7 % (ref 11.0–15.0)
Total Lymphocyte: 14.8 %
WBC: 14.9 10*3/uL — ABNORMAL HIGH (ref 3.8–10.8)

## 2019-01-24 LAB — LIPID PANEL
Cholesterol: 163 mg/dL (ref <200)
HDL: 36 mg/dL — AB (ref >40)
LDL Cholesterol (Calc): 103 mg/dL (calc) — ABNORMAL HIGH
Non-HDL Cholesterol (Calc): 127 mg/dL (calc) (ref <130)
Total CHOL/HDL Ratio: 4.5 (calc) (ref <5.0)
Triglycerides: 141 mg/dL (ref <150)

## 2019-01-24 LAB — TSH: TSH: 4.48 m[IU]/L (ref 0.40–4.50)

## 2019-01-30 ENCOUNTER — Encounter: Payer: Self-pay | Admitting: Physician Assistant

## 2019-01-30 ENCOUNTER — Telehealth: Payer: Self-pay | Admitting: Physician Assistant

## 2019-01-30 NOTE — Telephone Encounter (Signed)
-----   Message from Gregery Na, CMA sent at 01/30/2019  9:47 AM EST ----- Regarding: WORK NOTE Contact: [phone] Patient would like a work note stating that you treated him for a LUNG Infection last week please and thank you.

## 2019-02-03 ENCOUNTER — Other Ambulatory Visit: Payer: Self-pay | Admitting: Physician Assistant

## 2019-02-03 DIAGNOSIS — F411 Generalized anxiety disorder: Secondary | ICD-10-CM

## 2019-02-05 NOTE — Telephone Encounter (Signed)
-----   Message from Gregery Na, CMA sent at 02/05/2019 12:05 PM EST ----- Regarding: med refill Contact: [phone] Per pt/Yellow note:   Refill on ATIVAN Please & Thank You!   FYI: pharmacy:  [facility]

## 2019-02-13 ENCOUNTER — Other Ambulatory Visit: Payer: Self-pay | Admitting: Physician Assistant

## 2019-02-17 ENCOUNTER — Other Ambulatory Visit: Payer: Self-pay

## 2019-02-17 MED ORDER — FLUTICASONE-UMECLIDIN-VILANT 100-62.5-25 MCG/INH IN AEPB: 1.0000 | INHALATION_SPRAY | Freq: Every day | RESPIRATORY_TRACT | 5 refills | Status: DC

## 2019-02-18 ENCOUNTER — Other Ambulatory Visit: Payer: Self-pay | Admitting: Internal Medicine

## 2019-03-06 ENCOUNTER — Other Ambulatory Visit: Payer: Self-pay | Admitting: Physician Assistant

## 2019-03-06 DIAGNOSIS — F411 Generalized anxiety disorder: Secondary | ICD-10-CM

## 2019-04-07 ENCOUNTER — Other Ambulatory Visit: Payer: Self-pay | Admitting: Physician Assistant

## 2019-04-07 DIAGNOSIS — F411 Generalized anxiety disorder: Secondary | ICD-10-CM

## 2019-04-28 NOTE — Progress Notes (Signed)
Patient ID: Samuel Sharp, male   DOB: [birth year], 58 y.o.   MRN: [id number] THIS ENCOUNTER IS A VIRTUAL/TELEPHONE VISIT DUE TO COVID-19 - PATIENT WAS NOT SEEN IN THE OFFICE.  PATIENT HAS CONSENTED TO VIRTUAL VISIT / TELEMEDICINE VISIT  This provider placed a call to [name] using telephone, his appointment was changed to a virtual office visit to reduce the risk of exposure to the COVID-19 virus and to help [name] remain healthy and safe. The virtual visit will also provide continuity of care. He verbalizes understanding.   Assessment and Plan:  Hypertension:  -Continue medication, azor samples given -monitor blood pressure at home.  -Continue DASH diet.   -Reminder to go to the ER if any CP, SOB, nausea, dizziness, severe HA, changes vision/speech, left arm numbness and tingling, and jaw pain.  Cholesterol: -Continue diet and exercise.  -Check cholesterol   Hypothyroidism -check TSH level, continue medications the same, reminded to take on an empty stomach 30-29mins before food.   Obesity with co morbidities - long discussion about weight loss, diet, and exercise  COPD -trelegy continue it- samples given prenidosne sent in for exacerbation Patient needs to follow up with pulmonary Had CT chest 05/2017- needs repeat but he does not have insurance will get it after he gets insurance.   Anxiety Ativan 1mg  BID no more than than at a time   Continue diet and meds as discussed. Further disposition pending results of labs. Future Appointments  Date Time Provider Department Center  04/30/2019  3:45 PM Quentin Mulling, PA-C [facility] None    HPI 58 y.o. male  presents for 3 month follow up with hypertension, hyperlipidemia, prediabetes and vitamin D.   His blood pressure has been controlled at home- has been 120-130, today their BP is BP: 128/88  He does workout. He denies chest pain, dizziness.   He has asthma/COPD, he is on trilegy and proair but states this is not helping,  he has SOB constantly, worse with exertion, states walking 50 feet he gets SOB, stairs are worse, follows with Dr. Marchelle Gearing and is applying for social security disability. He is over due for a CT chest, discussed last visit, will get with insurance. No weight loss or night sweats.    He has anxiety that affects his breathing, he has done a great job of cutting back from 2 mg TID to 1 mg BID, will continue this until we are able to find a daily anxiety medication that helps and hopefully at that time he will start taking the valium AS needed BID. He has tried effexor that could not help, he states that after the pandemic without working he hopes to decrease it.   Patient always has "dehydration" on BUN/Cret, but states he drinks 6-7 64 oz glasses of water a day. Will get osmolality studies.   He is on thyroid medication. His medication was not changed last visit however, 1 pill daily at night in the middle of the night.   Lab Results  Component Value Date   TSH 4.48 01/23/2019    He is on cholesterol medication and denies myalgias. His cholesterol is at goal. The cholesterol last visit was:   Lab Results  Component Value Date   CHOL 163 01/23/2019   HDL 36 (L) 01/23/2019   LDLCALC 103 (H) 01/23/2019   TRIG 141 01/23/2019   CHOLHDL 4.5 01/23/2019    He has been working on diet and exercise for prediabetes, and denies foot ulcerations,  hyperglycemia, hypoglycemia , increased appetite, nausea, paresthesia of the feet, polydipsia, polyuria, visual disturbances, vomiting and weight loss. Last A1C in the office was:  Lab Results  Component Value Date   HGBA1C 5.0 06/21/2017   Patient is on Vitamin D supplement.  Lab Results  Component Value Date   VD25OH 75 06/21/2017     BMI is Body mass index is 32.55 kg/m., he is working on diet and exercise. Wt Readings from Last 3 Encounters:  04/30/19 240 lb (108.9 kg)  01/23/19 240 lb (108.9 kg)  08/22/18 244 lb (110.7 kg)   Current Medications:   Current Outpatient Medications on File Prior to Visit  Medication Sig Dispense Refill  . amLODipine-olmesartan (AZOR) 5-40 MG tablet TAKE ONE TABLET BY MOUTH DAILY 90 tablet 0  . atorvastatin (LIPITOR) 40 MG tablet TAKE ONE TABLET BY MOUTH DAILY 90 tablet 0  . bisoprolol-hydrochlorothiazide (ZIAC) 10-6.25 MG tablet TAKE ONE TABLET BY MOUTH DAILY 90 tablet 0  . clobetasol ointment (TEMOVATE) 0.05 % Apply as needed    . Flaxseed, Linseed, (EQL FLAX SEED OIL) 1000 MG CAPS Take 1,000 mg by mouth daily.    . Fluticasone-Umeclidin-Vilant (TRELEGY ELLIPTA) 100-62.5-25 MCG/INH AEPB Inhale 1 puff into the lungs daily. 60 each 5  . levalbuterol (XOPENEX HFA) 45 MCG/ACT inhaler Inhale 2 puffs into the lungs every 6 (six) hours as needed for wheezing or shortness of breath. 1 Inhaler 12  . levothyroxine (SYNTHROID, LEVOTHROID) 100 MCG tablet TAKE 1 TABLET BY MOUTH ONCE DAILY 90 tablet 0  . LORazepam (ATIVAN) 1 MG tablet TAKE ONE TABLET BY MOUTH TWICE A DAY AS NEEDED FOR ANXIETY. **TRY NOT TO TAKE TWO TABLETS A DAY** 60 tablet 0  . Multiple Vitamins-Minerals (MULTIVITAMIN WITH MINERALS) tablet Take 1 tablet by mouth daily.    . Omega-3 Fatty Acids (FISH OIL) 1000 MG CAPS Take 1,000 mg by mouth daily.    . predniSONE (DELTASONE) 20 MG tablet 2 tablets daily for 3 days, 1 tablet daily for 4 days. 10 tablet 0   No current facility-administered medications on file prior to visit.     Medical History:  Past Medical History:  Diagnosis Date  . Asthma    Present all life. Dx as child. Been in [facility] since 2003--pft 06/03/10 FEV1 3.14/ 82%, FEV1/FVC 0.54; insig resp to BD, DLCO 75%, ni lUNG  Vol  . COPD (chronic obstructive pulmonary disease) (HCC)   . Detached retina   . History of TB skin testing    negative 2005-2010  . Hyperlipidemia   . Hypertension    on lisinopril 2002/2003--changed ARB march 2011  . Lung nodule    5mm on ct 11/10/10- likely lymph node  . Palpitations    started atenolol since 2003.  recollects hx of holter. 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 informed it "Not a dangerous condition"-changed to lopressor 12/15/2010  . Psoriasis    on enbre;l since 005. started in [place]. Stopped 2011 by [place] derm due to nodule on chest  . Pulmonary embolism (HCC)    does not recollect tpA or being on lot of o2 or hypotension. 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 Ace Inhibitors     cough  . Haldol [Haloperidol Lactate]     psychosis  . Haloperidol Lactate     REACTION: phsycotic break     Review of Systems:  Review of Systems  Constitutional: Negative for chills, diaphoresis and fever.  HENT: Negative for congestion, ear pain and sore throat.   Eyes: Negative for blurred vision and double vision.  Respiratory: Negative for cough, shortness of breath and wheezing.   Cardiovascular: Positive for palpitations. Negative for chest pain and leg swelling.  Gastrointestinal: Positive for heartburn. Negative for blood in stool, constipation, diarrhea and melena.  Genitourinary: Negative.   Neurological: Negative for headaches.    Family history- Review and unchanged  Social history- Review and unchanged  Physical Exam: BP 128/88   Ht 6' (1.829 m)   Wt 240 lb (108.9 kg)   BMI 32.55 kg/m  Wt Readings from Last 3 Encounters:  04/30/19 240 lb (108.9 kg)  01/23/19 240 lb (108.9 kg)  08/22/18 244 lb (110.7 kg)    General Appearance:Well sounding, in no apparent distress.  ENT/Mouth: No hoarseness, + cough multiple times during the visit Respiratory: completing full sentences without distress, some SOB with walking around while on the phone, without audible wheeze Neuro: Awake and oriented X 3,  Psych:  Insight and Judgment appropriate.     Quentin Mulling, PA-C 3:42 PM [facility],

## 2019-04-30 ENCOUNTER — Other Ambulatory Visit: Payer: Self-pay

## 2019-04-30 ENCOUNTER — Encounter: Payer: Self-pay | Admitting: Physician Assistant

## 2019-04-30 ENCOUNTER — Ambulatory Visit: Payer: Self-pay | Admitting: Physician Assistant

## 2019-04-30 VITALS — BP 128/88 | Ht 72.0 in | Wt 240.0 lb

## 2019-04-30 DIAGNOSIS — Z79899 Other long term (current) drug therapy: Secondary | ICD-10-CM

## 2019-04-30 DIAGNOSIS — I10 Essential (primary) hypertension: Secondary | ICD-10-CM

## 2019-04-30 DIAGNOSIS — F411 Generalized anxiety disorder: Secondary | ICD-10-CM

## 2019-04-30 DIAGNOSIS — J439 Emphysema, unspecified: Secondary | ICD-10-CM

## 2019-04-30 DIAGNOSIS — E039 Hypothyroidism, unspecified: Secondary | ICD-10-CM

## 2019-04-30 DIAGNOSIS — I7121 Aneurysm of the ascending aorta, without rupture: Secondary | ICD-10-CM

## 2019-04-30 DIAGNOSIS — J449 Chronic obstructive pulmonary disease, unspecified: Secondary | ICD-10-CM

## 2019-04-30 DIAGNOSIS — R918 Other nonspecific abnormal finding of lung field: Secondary | ICD-10-CM

## 2019-04-30 DIAGNOSIS — E782 Mixed hyperlipidemia: Secondary | ICD-10-CM

## 2019-04-30 DIAGNOSIS — I719 Aortic aneurysm of unspecified site, without rupture: Secondary | ICD-10-CM

## 2019-05-05 ENCOUNTER — Other Ambulatory Visit: Payer: Self-pay | Admitting: Physician Assistant

## 2019-05-05 DIAGNOSIS — F411 Generalized anxiety disorder: Secondary | ICD-10-CM

## 2019-05-26 ENCOUNTER — Encounter: Payer: Self-pay | Admitting: Physician Assistant

## 2019-05-26 ENCOUNTER — Other Ambulatory Visit: Payer: Self-pay | Admitting: Physician Assistant

## 2019-05-26 ENCOUNTER — Telehealth: Payer: Self-pay

## 2019-05-26 NOTE — Progress Notes (Signed)
Future Appointments  Date Time Provider Department Center  09/02/2019  9:30 AM Quentin Mulling, PA-C [facility] None

## 2019-05-26 NOTE — Telephone Encounter (Signed)
LVM informing patient that his out of work note has been printed & put in a folder for pick up. & a copy was put in the mail.  June 1st 2020

## 2019-05-28 ENCOUNTER — Encounter: Payer: Self-pay | Admitting: Nurse Practitioner

## 2019-05-28 ENCOUNTER — Other Ambulatory Visit: Payer: Self-pay

## 2019-05-28 ENCOUNTER — Ambulatory Visit (INDEPENDENT_AMBULATORY_CARE_PROVIDER_SITE_OTHER): Payer: Self-pay | Admitting: Nurse Practitioner

## 2019-05-28 VITALS — BP 132/70 | HR 71 | Temp 97.9°F | Ht 73.0 in | Wt 255.0 lb

## 2019-05-28 DIAGNOSIS — J439 Emphysema, unspecified: Secondary | ICD-10-CM

## 2019-05-28 MED ORDER — FLUTICASONE-UMECLIDIN-VILANT 100-62.5-25 MCG/INH IN AEPB: 1.0000 | INHALATION_SPRAY | Freq: Every day | RESPIRATORY_TRACT | 0 refills | Status: DC

## 2019-05-28 NOTE — Progress Notes (Signed)
@Patient  ID: Samuel Sharp, male    DOB: 12-01-1961, 58 y.o.   MRN: [id number]  Chief Complaint  Patient presents with  . Emphysema    Has gotten worse. Patient thinks he may need another PFT done.    Referring provider: Lucky Cowboy, MD  HPI  58 year old former smoker with moderate-severe COPD w/emphysema, multiple lung nodules, and GERD who was formerly followed by Samuel Sharp Maintenance: Trelegy  Tests:  CT chest 06/22/19 - The previously seen pleural based right lower lobe nodule has decreased in size since prior study. I favor this represents area of residual scarring. 6 mm right lower lobe nodule on image 92 is stable. There are new right upper lobe nodules as described above which warrant follow-up. Recommend repeat CT in 6-12 months.  CT CHEST W/O 06/01/17 (personally reviewed by me):  6 mm nodule within right lower lobe is stable. The nodular pleural-based opacity within the medial right lower lobe has decreased in size. New subcentimeter nodules are noted within the right upper lobe measuring up to 6 mm in largest dimension. These were not obviously present on previous CT imaging in 2014. Pleural thickening on the right is noted. No pleural effusion is appreciated. No pathologic mediastinal adenopathy. Apical predominant emphysema is noted. No pericardial effusion. 4.5 cm dilation of ascending aorta compared with 4.1 cm dilation in 2013.  CXR PA/LAT 07/07/14 (previously reviewed by me): Questionable hazy opacity right lung base. No new focal opacity. No pleural effusion. Heart normal in size & mediastinum normal in contour.  CT CHEST W/O 12/30/12 (previously reviewed by me):  Apical predominant emphysematous changes. Minimal linear opacity in right lung and at the base of the left lung consistent with scar tissue formation. Pleural thickening in the base of the right lung. 5 mm groundglass nodule in the superior right lower lobe. No pathologic mediastinal adenopathy. No pleural  effusion. No pericardial effusion.  R PFA (07/23/12): 110cc Tubid, Yellow Total protein: 5.2 LDH: 1404 WBC: 1672 (neutrophils 63%, lymphs 20%, eos 12%, monocytes 5%) Cytology: No malignancy Routine culture: Negative  MICROBIOLOGY Bronchial Wash RUL (07/26/12):  AFB negative / Candida albicans / Normal Oral Flora Sputum Culture (07/23/12):  Normal Oral Flora  PATHOLOGY RUL WASH & BRUSH (07/26/12):  Negative for malignancy. RUL Transbronchial Biopsy (07/26/12):  No malignancy. Interstitial fibrosis noted.  LABS 05/25/11 Alpha-1 antitrypsin: MM  PFT 07/13/17: FVC 3.96 L (74%) FEV1 2.40 L (59%) FEV1/FVC 0.61 FEF 25-75 1.19 L (35%) negative bronchodilator response TLC 7.23 L (95%) RV 121% ERV 32% DLCO corrected 50% 11/15/12: FVC 4.80 L (92%) FEV1 2.48 L (61%) FEV1/FVC 0.52 FEF 25-75 0.85 L (22%) 06/03/10: FVC 5.44 L (103%) FEV1 3.18 L (83%) FEV1/FVC 0.58 FEF 25-75 1.27 L (35%) negative bronchodilator response TLC 8.40 L (112%) RV 106% ERV 83% DLCO uncorrected 75%  OV 05/28/19 - Follow up Patient presents today for follow-up visit on COPD.  Patient was last seen by Samuel Sharp on 09/10/2017.  Patient has moderate-severe COPD with emphysema.  States that over the past 2 years his COPD has been managed by his PCP.  Patient wants to get disability forms filled out today.  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  Patient reports that he does cough frequently and has  clear mucus at times.  He denies any significant wheezing.  He has been using Trelegy daily.  He uses his rescue inhaler several times per week but states that he does not get any significant relief with this inhaler. Denies f/c/s, n/v/d, hemoptysis, PND, leg swelling.  Multiple lung nodules: Patient's last chest CT was in June 2018.  The CT showed  multiple lung nodules and recommended repeat CT scan at 6 to 12 months.  Patient failed to follow-up on this.  Patient refuses to have any imaging scheduled at this time due to financial concerns.    Note: patient wanted PFT completed at office visit today but was advised that this would need to be scheduled and that there are strict precautions for this procedure at this time due to current COVID pandemic.  Note: Patient walked in office today - sats remained above 90% for entire walk     Allergies  Allergen Reactions  . Ace Inhibitors     cough  . Haldol [Haloperidol Lactate]     psychosis  . Haloperidol Lactate     REACTION: phsycotic break    Immunization History  Administered Date(s) Administered  . Influenza Split 11/15/2012  . Influenza Whole 09/24/2010  . Pneumococcal Conjugate-13 05/31/2017  . Pneumococcal Polysaccharide-23 06/03/2010  . Tdap 06/12/2016    Past Medical History:  Diagnosis Date  . Asthma    Present all life. Dx as child. Been in [facility] since 2003--pft 06/03/10 FEV1 3.14/ 82%, FEV1/FVC 0.54; insig resp to BD, DLCO 75%, ni lUNG  Vol  . COPD (chronic obstructive pulmonary disease) (HCC)   . Detached retina   . History of TB skin testing    negative 2005-2010  . Hyperlipidemia   . Hypertension    on lisinopril 2002/2003--changed ARB march 2011  . Lung nodule    5mm on ct 11/10/10- likely lymph node  . Palpitations    started atenolol since 2003. recollects hx of holter. 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 informed it "Not a dangerous condition"-changed to lopressor 12/15/2010  . Psoriasis    on enbre;l since 005. started in [place]. Stopped 2011 by [place] derm due to nodule on chest  . Pulmonary embolism (HCC)    does not recollect tpA or being on lot of o2 or hypotension. 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 Packs/day: 2.00   . Years: 35.00  . Pack years: 70.00  . Types: Cigarettes  . Last attempt to quit: 03/25/2010  . Years since quitting: 9.1  Smokeless Tobacco Never Used   Counseling given: Not Answered   Outpatient Encounter Medications as of 05/28/2019  Medication Sig  . amLODipine-olmesartan (AZOR) 5-40 MG tablet TAKE ONE TABLET BY MOUTH DAILY  . atorvastatin (LIPITOR) 40 MG tablet TAKE ONE TABLET BY MOUTH DAILY  . bisoprolol-hydrochlorothiazide (ZIAC) 10-6.25 MG tablet TAKE ONE TABLET BY MOUTH DAILY  . clobetasol ointment (TEMOVATE) 0.05 % Apply as needed  . Flaxseed, Linseed, (EQL FLAX SEED OIL) 1000 MG CAPS Take 1,000 mg by mouth daily.  . Fluticasone-Umeclidin-Vilant (TRELEGY ELLIPTA) 100-62.5-25 MCG/INH AEPB Inhale 1 puff into the lungs daily.  Marland Kitchen levalbuterol (XOPENEX HFA) 45 MCG/ACT inhaler Inhale 2 puffs into the lungs every 6 (six) hours as needed for wheezing or shortness of breath.  . levothyroxine (SYNTHROID, LEVOTHROID) 100 MCG tablet TAKE 1 TABLET BY MOUTH ONCE DAILY  .  LORazepam (ATIVAN) 1 MG tablet TAKE ONE TABLET BY MOUTH TWICE A DAY AS NEEDED ANXIETY ** TRY NOT TO TAKE TWO TABLETS A DAY  . Multiple Vitamins-Minerals (MULTIVITAMIN WITH MINERALS) tablet Take 1 tablet by mouth daily.  . Omega-3 Fatty Acids (FISH OIL) 1000 MG CAPS Take 1,000 mg by mouth daily.  . Fluticasone-Umeclidin-Vilant (TRELEGY ELLIPTA) 100-62.5-25 MCG/INH AEPB Inhale 1 puff into the lungs daily.  . [DISCONTINUED] predniSONE (DELTASONE) 20 MG tablet 2 tablets daily for 3 days, 1 tablet daily for 4 days. (Patient not taking: Reported on 05/28/2019)   No facility-administered encounter medications on file as of 05/28/2019.      Review of Systems  Review of Systems  Constitutional: Negative.  Negative for chills and fever.  HENT: Negative.   Respiratory: Positive for cough and shortness of breath. Negative for wheezing.   Cardiovascular: Negative.  Negative for chest pain, palpitations and leg swelling.   Gastrointestinal: Negative.   Allergic/Immunologic: Negative.   Neurological: Negative.   Psychiatric/Behavioral: Negative.        Physical Exam  BP 132/70 (BP Location: Left Arm, Patient Position: Sitting, Cuff Size: Large)   Pulse 71   Temp 97.9 F (36.6 C)   Ht  (1.854 m)   Wt 255 lb (115.7 kg)   SpO2 96%   BMI 33.64 kg/m   Wt Readings from Last 5 Encounters:  05/28/19 255 lb (115.7 kg)  04/30/19 240 lb (108.9 kg)  01/23/19 240 lb (108.9 kg)  08/22/18 244 lb (110.7 kg)  07/15/18 224 lb 3.2 oz (101.7 kg)     Physical Exam Vitals signs and nursing note reviewed.  Constitutional:      General: He is not in acute distress.    Appearance: He is well-developed.  Cardiovascular:     Rate and Rhythm: Normal rate and regular rhythm.  Pulmonary:     Effort: Pulmonary effort is normal. No respiratory distress.     Breath sounds: Normal breath sounds. No wheezing or rhonchi.  Musculoskeletal:        General: No swelling.  Skin:    General: Skin is warm and dry.  Neurological:     Mental Status: He is alert and oriented to person, place, and time.       Assessment & Plan:   Pulmonary emphysema [facility]) Patient was last seen by Samuel Sharp on 09/10/2017.  Patient has moderate-severe COPD with emphysema and lung nodules.  He states that over the past 2 years his COPD has been managed by his PCP.  Patient wants to get disability forms filled out today.  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  Patient reports that he does cough frequently and has clear mucus at times.  He denies any significant wheezing.  He has been using Trelegy daily.  He uses his rescue inhaler several times per week but states that he does not get any significant relief with this inhaler. We discussed that patient  will need PFT, but this will need to be scheduled. Patient also needs follow up imaging for nodules. We will get him information to reach out for financial assistance. Samples of trelegy given in office today.   Patient Instructions  COPD: Will start disability paperwork Continue trelegy - will give samples -  Financial assistance information given Continue albuterol as needed  Lung Nodules: Patient refuses follow up CT or chest x ray at this time due to cost Will give patient information for financial assistance  Patient walked in office today - sats remained above 90% for entire walk  Follow up: Follow up with Dr. Marchelle Gearingamaswamy in 1 month with PFT same day as appointment or follow up with APP if Dr. Marchelle Gearingamaswamy is not available.        Ivonne Andrewonya S Jaymarie Yeakel, NP 05/28/2019

## 2019-05-28 NOTE — Assessment & Plan Note (Addendum)
Patient was last seen by Dr. Jamison Neighbor on 09/10/2017.  Patient has moderate-severe COPD with emphysema and lung nodules.  He states that over the past 2 years his COPD has been managed by his PCP.  Patient wants to get disability forms filled out today.  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  Patient reports that he does cough frequently and has clear mucus at times.  He denies any significant wheezing.  He has been using Trelegy daily.  He uses his rescue inhaler several times per week but states that he does not get any significant relief with this inhaler. We discussed that patient will need PFT, but this will need to be scheduled. Patient also needs follow up imaging for nodules. We will get him information to reach out for financial assistance. Samples of trelegy given in office today.   Patient Instructions  COPD: Will start disability paperwork Continue trelegy - will give samples - Financial assistance information given Continue albuterol as needed  Lung Nodules: Patient refuses follow up CT or chest x ray at this time due to cost Will give patient information for financial assistance  Patient walked in office today - sats remained above 90% for entire walk  Follow up: Follow up with Dr. Marchelle Gearing in 1 month with PFT same day as appointment or follow up with APP if Dr. Marchelle Gearing is not available.

## 2019-05-28 NOTE — Patient Instructions (Addendum)
COPD: Will start disability paperwork Continue trelegy - will give samples - Financial assistance information given Continue albuterol as needed  Lung Nodules: Patient refuses follow up CT or chest x ray at this time due to cost Will give patient information for financial assistance  Patient walked in office today - sats remained above 90% for entire walk  Follow up: Follow up with Dr. Marchelle Gearing in 1 month with PFT same day as appointment or follow up with APP if Dr. Marchelle Gearing is not available.

## 2019-06-02 ENCOUNTER — Other Ambulatory Visit: Payer: Self-pay | Admitting: Physician Assistant

## 2019-06-02 DIAGNOSIS — F411 Generalized anxiety disorder: Secondary | ICD-10-CM

## 2019-06-03 ENCOUNTER — Other Ambulatory Visit: Payer: Self-pay | Admitting: Internal Medicine

## 2019-06-03 DIAGNOSIS — F411 Generalized anxiety disorder: Secondary | ICD-10-CM

## 2019-06-03 MED ORDER — LORAZEPAM 1 MG PO TABS: ORAL_TABLET | ORAL | 0 refills | Status: DC

## 2019-06-04 ENCOUNTER — Telehealth: Payer: Self-pay

## 2019-06-04 NOTE — Telephone Encounter (Signed)
Patient reports that the pharmacy has not received request yet.  Provider checked & Rx was sent on 06/03/19 to [facility].  Message left on phone in order to inform patient.

## 2019-06-30 ENCOUNTER — Other Ambulatory Visit: Payer: Self-pay | Admitting: Internal Medicine

## 2019-06-30 DIAGNOSIS — F411 Generalized anxiety disorder: Secondary | ICD-10-CM

## 2019-07-04 ENCOUNTER — Other Ambulatory Visit: Payer: Self-pay | Admitting: Internal Medicine

## 2019-07-12 ENCOUNTER — Other Ambulatory Visit (HOSPITAL_COMMUNITY)
Admission: RE | Admit: 2019-07-12 | Discharge: 2019-07-12 | Disposition: A | Discharge status: Home / Self Care | Payer: HRSA Program | Source: Ambulatory Visit | Attending: Internal Medicine | Admitting: Internal Medicine

## 2019-07-12 DIAGNOSIS — Z1159 Encounter for screening for other viral diseases: Secondary | ICD-10-CM | POA: Diagnosis not present

## 2019-07-12 LAB — SARS CORONAVIRUS 2 (TAT 6-24 HRS): SARS Coronavirus 2: NEGATIVE

## 2019-07-16 ENCOUNTER — Encounter: Payer: Self-pay | Admitting: Nurse Practitioner

## 2019-07-16 ENCOUNTER — Ambulatory Visit (INDEPENDENT_AMBULATORY_CARE_PROVIDER_SITE_OTHER): Payer: Self-pay | Admitting: Internal Medicine

## 2019-07-16 ENCOUNTER — Other Ambulatory Visit: Payer: Self-pay

## 2019-07-16 ENCOUNTER — Ambulatory Visit (INDEPENDENT_AMBULATORY_CARE_PROVIDER_SITE_OTHER): Payer: 59 | Admitting: Nurse Practitioner

## 2019-07-16 VITALS — BP 128/70 | HR 69 | Temp 98.3°F | Ht 73.0 in | Wt 255.0 lb

## 2019-07-16 DIAGNOSIS — J439 Emphysema, unspecified: Secondary | ICD-10-CM

## 2019-07-16 LAB — PULMONARY FUNCTION TEST
DL/VA % pred: 60 %
DL/VA: 2.53 ml/min/mmHg/L
DLCO unc % pred: 51 %
DLCO unc: 15.45 ml/min/mmHg
FEF 25-75 Post: 1.02 L/sec
FEF 25-75 Pre: 0.91 L/sec
FEF2575-%Change-Post: 12 %
FEF2575-%Pred-Post: 31 %
FEF2575-%Pred-Pre: 27 %
FEV1-%Change-Post: 2 %
FEV1-%Pred-Post: 53 %
FEV1-%Pred-Pre: 52 %
FEV1-Post: 2.14 L
FEV1-Pre: 2.09 L
FEV1FVC-%Change-Post: 0 %
FEV1FVC-%Pred-Pre: 73 %
FEV6-%Change-Post: 4 %
FEV6-%Pred-Post: 73 %
FEV6-%Pred-Pre: 70 %
FEV6-Post: 3.74 L
FEV6-Pre: 3.57 L
FEV6FVC-%Change-Post: 2 %
FEV6FVC-%Pred-Post: 102 %
FEV6FVC-%Pred-Pre: 99 %
FVC-%Change-Post: 1 %
FVC-%Pred-Post: 72 %
FVC-%Pred-Pre: 70 %
FVC-Post: 3.81 L
FVC-Pre: 3.75 L
Post FEV1/FVC ratio: 56 %
Post FEV6/FVC ratio: 98 %
Pre FEV1/FVC ratio: 56 %
Pre FEV6/FVC Ratio: 95 %
RV % pred: 111 %
RV: 2.7 L
TLC % pred: 96 %
TLC: 7.36 L

## 2019-07-16 MED ORDER — TRELEGY ELLIPTA 100-62.5-25 MCG/INH IN AEPB: 1.0000 | INHALATION_SPRAY | Freq: Every day | RESPIRATORY_TRACT | 0 refills | Status: DC

## 2019-07-16 NOTE — Assessment & Plan Note (Signed)
Patient presents today for a follow-up visit with PFT.  Patient's last PFT was in 2018.  PFT today did show slight decrease in FEV1 and FVC.  Patient still complains of shortness of breath with exertion.  He quit his job in April due to physical demands of job.  He is filing for disability COPD.  Patient was walked in office today but sats remained above 92% for the entire walk.  Patient is active and can perform activities of daily living without assistance.  He does state that he gets short of breath when doing housework or taking a shower.  Patient does not have insurance at this time he has not been able to afford the Trelegy inhaler but has had samples.  He states that his samples did run out yesterday.  He does continue to use albuterol as needed.  Patient Instructions  COPD: PFT discussed in office today Continue trelegy - will give samples - Financial assistance information given Continue albuterol as needed  Lung Nodules: Patient refuses follow up CT or chest x ray at this time due to cost Refuses pulmonary rehab at this time Will give patient information for financial assistance  Follow up: Follow up with Dr. Chase Caller in 3-4 months or sooner if needed

## 2019-07-16 NOTE — Progress Notes (Signed)
PFT done today. 

## 2019-07-16 NOTE — Patient Instructions (Addendum)
COPD: PFT discussed in office today Continue trelegy - will give samples - Financial assistance information given Continue albuterol as needed  Lung Nodules: Patient refuses follow up CT or chest x ray at this time due to cost Refuses pulmonary rehab at this time Will give patient information for financial assistance  Follow up: Follow up with Dr. Chase Caller in 3-4 months or sooner if needed

## 2019-07-16 NOTE — Progress Notes (Signed)
 @Patient  ID: Samuel Sharp, male    DOB: 11/20/1961, 58 y.o.   MRN: [id number]  Chief Complaint  Patient presents with   Results    Discuss PFT results    Referring provider: Lucky CowboyMcKeown, William, MD  HPI  58 year old former smoker with moderate-severe COPD w/emphysema, multiple lung nodules, and GERD who was formerly followed by Dr. Celene SkeenNester. Maintenance: Trelegy  Tests:  CT chest 06/22/19 - The previously seen pleural based right lower lobe nodule has decreased in size since prior study. I favor this represents area of residual scarring. 6 mm right lower lobe nodule on image 92 is stable. There are new right upper lobe nodules as described above which warrant follow-up. Recommend repeat CT in 6-12 months.  CT CHEST W/O 06/01/17 (personally reviewed by me):6 mm nodule within right lower lobe is stable. The nodular pleural-based opacity within the medial right lower lobe has decreased in size. New subcentimeter nodules are noted within the right upper lobe measuring up to 6 mm in largest dimension. These were not obviously present on previous CT imaging in 2014. Pleural thickening on the right is noted. No pleural effusion is appreciated. No pathologic mediastinal adenopathy. Apical predominant emphysema is noted. No pericardial effusion.4.5 cm dilation of ascending aorta compared with 4.1 cm dilation in 2013.  CXR PA/LAT 07/07/14 (previously reviewed by me):Questionable hazy opacity right lung base. No new focal opacity. No pleural effusion. Heart normal in size &mediastinum normal in contour.  CT CHEST W/O 12/30/12 (previously reviewed by me):Apical predominant emphysematous changes. Minimal linear opacity in right lung and at the base of the left lung consistent with scar tissue formation. Pleural thickening in the base of the right lung. 5 mm groundglass nodule in the superior right lower lobe. No pathologic mediastinal adenopathy. No pleural effusion. No pericardial effusion.  R PFA  (07/23/12): 110cc Tubid, Yellow Total protein: 5.2 LDH: 1404 WBC: 1672 (neutrophils 63%, lymphs 20%, eos 12%, monocytes 5%) Cytology: No malignancy Routine culture: Negative  MICROBIOLOGY Bronchial Wash RUL (07/26/12): AFB negative / Candida albicans / Normal Oral Flora Sputum Culture (07/23/12): Normal Oral Flora  PATHOLOGY RUL WASH &BRUSH (07/26/12): Negative for malignancy. RUL Transbronchial Biopsy (07/26/12): No malignancy. Interstitial fibrosis noted.  LABS 05/25/11 Alpha-1 antitrypsin: MM  PFT 07/16/19: FVC 3.75 L (70%) FEV1 2.09 L (52%) FEV1/FVC 0.56 FEF 25-75 0.91 L (27%) negative bronchodlator response TLC 7.36 L (96%) RV 111% ERV 4% DLCO UNCORRECTED 51% 07/13/17: FVC 3.96 L (74%) FEV1 2.40 L (59%) FEV1/FVC 0.61 FEF 25-75 1.19 L (35%) negative bronchodilator response TLC 7.23 L (95%) RV 121% ERV 32% DLCO corrected 50% 11/15/12: FVC 4.80 L (92%) FEV1 2.48 L (61%) FEV1/FVC 0.52 FEF 25-75 0.85 L (22%) 06/03/10: FVC 5.44 L (103%) FEV1 3.18 L (83%) FEV1/FVC 0.58 FEF 25-75 1.27 L (35%) negative bronchodilator response TLC 8.40 L (112%) RV 106% ERV 83% DLCO uncorrected 75%   OV 07/16/19 - Follow up Patient presents today for a follow-up visit with PFT.  Patient's last PFT was in 2018.  PFT today did show slight decrease in FEV1 and FVC.  Patient still complains of shortness of breath with exertion.  He quit his job in April due to physical demands of job.  He is filing for disability COPD.  Patient was walked in office today but sats remained above 92% for the entire walk.  Patient is active and can perform activities of daily living without assistance.  He does state that he gets short of breath when doing  housework or taking a shower.  Patient does not have insurance at this time he has not been able to afford the Trelegy inhaler but has had samples.  He states that his samples did run out yesterday.  He does continue to use albuterol as needed. Denies f/c/s, n/v/d, hemoptysis, PND,  leg swelling.     Allergies  Allergen Reactions   Ace Inhibitors     cough   Haldol [Haloperidol Lactate]     psychosis   Haloperidol Lactate     REACTION: phsycotic break    Immunization History  Administered Date(s) Administered   Influenza Split 11/15/2012   Influenza Whole 09/24/2010   Pneumococcal Conjugate-13 05/31/2017   Pneumococcal Polysaccharide-23 06/03/2010   Tdap 06/12/2016    Past Medical History:  Diagnosis Date   Asthma    Present all life. Dx as child. Been in [facility] since 2003--pft 06/03/10 FEV1 3.14/ 82%, FEV1/FVC 0.54; insig resp to BD, DLCO 75%, ni lUNG  Vol   COPD (chronic obstructive pulmonary disease) (HCC)    Detached retina    History of TB skin testing    negative 2005-2010   Hyperlipidemia    Hypertension    on lisinopril 2002/2003--changed ARB march 2011   Lung nodule    5mm on ct 11/10/10- likely lymph node   Palpitations    started atenolol since 2003. recollects hx of holter. 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 informed it "Not a dangerous condition"-changed to lopressor 12/15/2010   Psoriasis    on enbre;l since 005. started in [place]. Stopped 2011 by [place] derm due to nodule on chest   Pulmonary embolism (HCC)    does not recollect tpA or being on lot of o2 or hypotension. 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    Tobacco History: Social History   Tobacco Use  Smoking Status Former Smoker   Packs/day: 2.00   Years: 35.00   Pack years: 70.00   Types: Cigarettes   Quit date: 03/25/2010   Years since quitting: 9.3  Smokeless Tobacco Never Used   Counseling given: Not Answered   Outpatient Encounter Medications as of 07/16/2019  Medication Sig   amLODipine-olmesartan (AZOR) 5-40 MG tablet TAKE ONE TABLET BY MOUTH DAILY   atorvastatin (LIPITOR) 40 MG tablet TAKE ONE TABLET BY MOUTH DAILY   bisoprolol-hydrochlorothiazide (ZIAC) 10-6.25 MG tablet TAKE ONE TABLET BY  MOUTH DAILY   clobetasol ointment (TEMOVATE) 0.05 % Apply as needed   Flaxseed, Linseed, (EQL FLAX SEED OIL) 1000 MG CAPS Take 1,000 mg by mouth daily.   Fluticasone-Umeclidin-Vilant (TRELEGY ELLIPTA) 100-62.5-25 MCG/INH AEPB Inhale 1 puff into the lungs daily.   Fluticasone-Umeclidin-Vilant (TRELEGY ELLIPTA) 100-62.5-25 MCG/INH AEPB Inhale 1 puff into the lungs daily.   levalbuterol (XOPENEX HFA) 45 MCG/ACT inhaler Inhale 2 puffs into the lungs every 6 (six) hours as needed for wheezing or shortness of breath.   levothyroxine (SYNTHROID, LEVOTHROID) 100 MCG tablet TAKE 1 TABLET BY MOUTH ONCE DAILY   LORazepam (ATIVAN) 1 MG tablet TAKE ONE-HALF TO ONE TABLET BY MOUTH UP TO TWO TIMES A DAY ONLY IF NEEDED FOR ANXIETY ATTACK . LIMIT TO 5 DAYS PER WEEK TO AVOID ADDICTION   Multiple Vitamins-Minerals (MULTIVITAMIN WITH MINERALS) tablet Take 1 tablet by mouth daily.   Omega-3 Fatty Acids (FISH OIL) 1000 MG CAPS Take 1,000 mg by mouth daily.   Fluticasone-Umeclidin-Vilant (TRELEGY ELLIPTA) 100-62.5-25 MCG/INH AEPB Inhale 1 puff into the lungs daily.   No facility-administered  encounter medications on file as of 07/16/2019.      Review of Systems  Review of Systems  Constitutional: Negative.  Negative for chills and fever.  HENT: Negative.   Respiratory: Positive for shortness of breath. Negative for cough and wheezing.   Cardiovascular: Negative.  Negative for chest pain, palpitations and leg swelling.  Gastrointestinal: Negative.   Allergic/Immunologic: Negative.   Neurological: Negative.   Psychiatric/Behavioral: Negative.        Physical Exam  BP 128/70 (BP Location: Left Arm, Patient Position: Sitting, Cuff Size: Normal)    Pulse 69    Temp 98.3 F (36.8 C)    Ht 6\' 1"  (1.854 m)    Wt 255 lb (115.7 kg)    SpO2 95%    BMI 33.64 kg/m   Wt Readings from Last 5 Encounters:  07/16/19 255 lb (115.7 kg)  05/28/19 255 lb (115.7 kg)  04/30/19 240 lb (108.9 kg)  01/23/19 240 lb  (108.9 kg)  08/22/18 244 lb (110.7 kg)     Physical Exam Vitals signs and nursing note reviewed.  Constitutional:      General: He is not in acute distress.    Appearance: He is well-developed.  Cardiovascular:     Rate and Rhythm: Normal rate and regular rhythm.  Pulmonary:     Effort: Pulmonary effort is normal. No respiratory distress.     Breath sounds: Normal breath sounds. No wheezing or rhonchi.  Musculoskeletal:        General: No swelling.  Skin:    General: Skin is warm and dry.  Neurological:     Mental Status: He is alert and oriented to person, place, and time.       Assessment & Plan:   Pulmonary emphysema [facility]) Patient presents today for a follow-up visit with PFT.  Patient's last PFT was in 2018.  PFT today did show slight decrease in FEV1 and FVC.  Patient still complains of shortness of breath with exertion.  He quit his job in April due to physical demands of job.  He is filing for disability COPD.  Patient was walked in office today but sats remained above 92% for the entire walk.  Patient is active and can perform activities of daily living without assistance.  He does state that he gets short of breath when doing housework or taking a shower.  Patient does not have insurance at this time he has not been able to afford the Trelegy inhaler but has had samples.  He states that his samples did run out yesterday.  He does continue to use albuterol as needed.  Patient Instructions  COPD: PFT discussed in office today Continue trelegy - will give samples - Financial assistance information given Continue albuterol as needed  Lung Nodules: Patient refuses follow up CT or chest x ray at this time due to cost Refuses pulmonary rehab at this time Will give patient information for financial assistance  Follow up: Follow up with Dr. Chase Caller in 3-4 months or sooner if needed       Fenton Foy, NP 07/16/2019

## 2019-07-25 ENCOUNTER — Telehealth: Payer: Self-pay | Admitting: Nurse Practitioner

## 2019-07-28 NOTE — Telephone Encounter (Signed)
Per conversation with Lazaro Arms, FNP-C the forms were signed and returned back to [facility]. Nothing further needed at this time.

## 2019-07-28 NOTE — Telephone Encounter (Signed)
Rec'd completed forms - fwd to [employer] via interoffice mail -pr  °

## 2019-07-31 ENCOUNTER — Other Ambulatory Visit: Payer: Self-pay | Admitting: Physician Assistant

## 2019-07-31 DIAGNOSIS — F411 Generalized anxiety disorder: Secondary | ICD-10-CM

## 2019-08-11 ENCOUNTER — Other Ambulatory Visit: Payer: Self-pay | Admitting: Physician Assistant

## 2019-08-28 ENCOUNTER — Other Ambulatory Visit: Payer: Self-pay | Admitting: Physician Assistant

## 2019-08-28 DIAGNOSIS — F411 Generalized anxiety disorder: Secondary | ICD-10-CM

## 2019-08-28 NOTE — Progress Notes (Deleted)
Patient ID: Samuel BeamGene M Sharp, male   DOB: 12/22/1961, 58 y.o.   MRN: [id number]  PATIENT IS SELF PAY   Assessment and Plan:  Hypertension:  -Continue medication, azor samples given -monitor blood pressure at home.  -Continue DASH diet.   -Reminder to go to the ER if any CP, SOB, nausea, dizziness, severe HA, changes vision/speech, left arm numbness and tingling, and jaw pain.  Cholesterol: -Continue diet and exercise.  -Check cholesterol   Hypothyroidism -check TSH level, continue medications the same, reminded to take on an empty stomach 30-7560mins before food.   Obesity with co morbidities - long discussion about weight loss, diet, and exercise  COPD -currently well controlled  -trelegy continue it- samples given prenidosne sent in for exacerbation Had CT chest 05/2017- needs repeat- OVER DUE  Anxiety Ativan 1mg  BID no more than than at a time, not taking daily, will add on effexor to see if we can try to decrease ativan use, intolerant to zoloft Increase effexor to 75mg  daily  Dehydration Check labs  Continue diet and meds as discussed. Further disposition pending results of labs. Future Appointments  Date Time Provider Department Center  09/03/2019 10:30 AM Quentin Mullingollier, Amanda, PA-C [id number] None    HPI 58 y.o. male  presents for 3 month follow up with hypertension, hyperlipidemia, prediabetes and vitamin D.   His blood pressure has been controlled at home- has been 120-130, today their BP is  .   He does workout. He denies chest pain, shortness of breath, dizziness.   He has asthma/COPD, is following with pulmonary. He has quit his job due to SOB and is seeking disability.  He does not have insurance anymore due to quitting his job, he has history of PE, he has history of abnormal CT and is due but has declined due to cost.  BMI is There is no height or weight on file to calculate BMI., he is working on diet and exercise. Wt Readings from Last 3 Encounters:  07/16/19 255 lb  (115.7 kg)  05/28/19 255 lb (115.7 kg)  04/30/19 240 lb (108.9 kg)     He has anxiety that affects his breathing, he has done a great job of cutting back from 2 mg TID to 1 mg BID, will continue this until we are able to find a daily anxiety medication that helps and hopefully at that time he will start taking the valium AS needed BID.  He is on thyroid medication. His medication was not changed last visit however, 1 pill daily at night in the middle of the night.   Lab Results  Component Value Date   TSH 4.48 01/23/2019    He is on cholesterol medication and denies myalgias. His cholesterol is at goal. The cholesterol last visit was:   Lab Results  Component Value Date   CHOL 163 01/23/2019   HDL 36 (L) 01/23/2019   LDLCALC 103 (H) 01/23/2019   TRIG 141 01/23/2019   CHOLHDL 4.5 01/23/2019    He has been working on diet and exercise for prediabetes, and denies foot ulcerations, hyperglycemia, hypoglycemia , increased appetite, nausea, paresthesia of the feet, polydipsia, polyuria, visual disturbances, vomiting and weight loss. Last A1C in the office was:  Lab Results  Component Value Date   HGBA1C 5.0 06/21/2017   Patient is on Vitamin D supplement.  Lab Results  Component Value Date   VD25OH 6075 06/21/2017     Current Medications:  Current Outpatient Medications on File Prior to Visit  Medication Sig Dispense Refill  . amLODipine-olmesartan (AZOR) 5-40 MG tablet TAKE ONE TABLET BY MOUTH DAILY 90 tablet 0  . atorvastatin (LIPITOR) 40 MG tablet TAKE ONE TABLET BY MOUTH DAILY 90 tablet 0  . bisoprolol-hydrochlorothiazide (ZIAC) 10-6.25 MG tablet TAKE ONE TABLET BY MOUTH DAILY 90 tablet 0  . clobetasol ointment (TEMOVATE) 0.05 % Apply as needed    . Flaxseed, Linseed, (EQL FLAX SEED OIL) 1000 MG CAPS Take 1,000 mg by mouth daily.    . Fluticasone-Umeclidin-Vilant (TRELEGY ELLIPTA) 100-62.5-25 MCG/INH AEPB Inhale 1 puff into the lungs daily. 60 each 5  .  Fluticasone-Umeclidin-Vilant (TRELEGY ELLIPTA) 100-62.5-25 MCG/INH AEPB Inhale 1 puff into the lungs daily. 1 each 0  . Fluticasone-Umeclidin-Vilant (TRELEGY ELLIPTA) 100-62.5-25 MCG/INH AEPB Inhale 1 puff into the lungs daily. 2 each 0  . levalbuterol (XOPENEX HFA) 45 MCG/ACT inhaler Inhale 2 puffs into the lungs every 6 (six) hours as needed for wheezing or shortness of breath. 1 Inhaler 12  . levothyroxine (SYNTHROID) 100 MCG tablet Take 1 tablet daily on an empty stomach with only water for 30 minutes & no Antacid meds, Calcium or Magnesium for 4 hours & avoid Biotin 90 tablet 0  . LORazepam (ATIVAN) 1 MG tablet TAKE ONE-HALF TO ONE TABLET BY MOUTH UP TO TWO TIMES A DAY ONLY IF NEEDED FOR ANXIETY ATTACK. * LIMIT TO 5 DAYS PER WEEK TO AVOID ADDICTION 60 tablet 0  . Multiple Vitamins-Minerals (MULTIVITAMIN WITH MINERALS) tablet Take 1 tablet by mouth daily.    . Omega-3 Fatty Acids (FISH OIL) 1000 MG CAPS Take 1,000 mg by mouth daily.     No current facility-administered medications on file prior to visit.     Medical History:  Past Medical History:  Diagnosis Date  . Asthma    Present all life. Dx as child. Been in [facility] since 2003--pft 06/03/10 FEV1 3.14/ 82%, FEV1/FVC 0.54; insig resp to BD, DLCO 75%, ni lUNG  Vol  . COPD (chronic obstructive pulmonary disease) ([place])   . Detached retina   . History of TB skin testing    negative 2005-2010  . Hyperlipidemia   . Hypertension    on lisinopril 2002/2003--changed ARB march 2011  . Lung nodule    82mm on ct 11/10/10- likely lymph node  . Palpitations    started atenolol since 2003. recollects hx of holter. 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 informed it "Not a dangerous condition"-changed to lopressor 12/15/2010  . Psoriasis    on enbre;l since 005. started in [place]. Stopped 2011 by [place] derm due to nodule on chest  . Pulmonary embolism ([place])    does not recollect tpA or being on lot of o2 or hypotension. 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 Ace Inhibitors     cough  . Haldol [Haloperidol Lactate]     psychosis  . Haloperidol Lactate     REACTION: phsycotic break     Review of Systems:  Review of Systems  Constitutional: Negative for chills, diaphoresis and fever.  HENT: Negative for congestion, ear pain and sore throat.   Eyes: Negative for blurred vision and double vision.  Respiratory: Negative for cough, shortness of breath and wheezing.   Cardiovascular: Positive for palpitations. Negative for chest pain and leg swelling.  Gastrointestinal: Positive for heartburn. Negative for blood in stool, constipation, diarrhea and melena.  Genitourinary: Negative.   Neurological: Negative  for headaches.    Family history- Review and unchanged  Social history- Review and unchanged  Physical Exam: There were no vitals taken for this visit. Wt Readings from Last 3 Encounters:  07/16/19 255 lb (115.7 kg)  05/28/19 255 lb (115.7 kg)  04/30/19 240 lb (108.9 kg)    General Appearance: Well nourished well developed, in no apparent distress. Eyes: PERRLA, EOMs, conjunctiva no swelling or erythema ENT/Mouth: Ear canals normal without obstruction, swelling, erythma, discharge.  TMs normal bilaterally.  Oropharynx moist, clear, without exudate, or postoropharyngeal swelling. Neck: Supple, thyroid normal,no cervical adenopathy  Respiratory: Respiratory effort normal, Breath sounds clear A&P without rhonchi, wheeze, or rale.  No retractions, no accessory usage. Cardio: RRR with frequent PVC's, no MRGs. Brisk peripheral pulses without edema.  Abdomen: Soft, + BS,  Non tender, no guarding, rebound, hernias, masses. Musculoskeletal: Full ROM, 5/5 strength, Normal gait Skin: Warm, dry without rashes, lesions, ecchymosis.  Neuro: Awake and oriented X 3, Cranial nerves intact. Normal muscle tone, no cerebellar symptoms. Psych: Normal affect,  Insight and Judgment appropriate.    Quentin Mulling, PA-C 8:28 AM [facility],

## 2019-09-02 ENCOUNTER — Ambulatory Visit: Payer: Self-pay | Admitting: Physician Assistant

## 2019-09-03 ENCOUNTER — Ambulatory Visit: Payer: Self-pay | Admitting: Physician Assistant

## 2019-09-04 ENCOUNTER — Other Ambulatory Visit: Payer: Self-pay | Admitting: Physician Assistant

## 2019-09-18 NOTE — Progress Notes (Signed)
Patient ID: Samuel BeamGene M Sharp, male   DOB: 08/04/1961, 58 y.o.   MRN: [id number]  PATIENT IS SELF PAY- NO CHARGE TODAY DUE TO PATIENT FINANCIAL HARDSHIP   Assessment and Plan:  Hypertension:  -Continue medication, azor samples given- DECLINES CHANGING TO CHEAPER MEDICATION -monitor blood pressure at home.  -Continue DASH diet.   -Reminder to go to the ER if any CP, SOB, nausea, dizziness, severe HA, changes vision/speech, left arm numbness and tingling, and jaw pain.  Cholesterol: -Continue diet and exercise.   Hypothyroidism - continue medications the same, reminded to take on an empty stomach 30-3860mins before food.   Obesity with co morbidities - long discussion about weight loss, diet, and exercise  COPD -patient states he is SOB with any movement, following with pulmonary -trelegy samples out in the office, given symbicort samples, 2 months worth Had CT chest 05/2017- needs repeat however no insurance at this time  Anxiety Patient currently living out of his car, waiting on disability to go through but was denied.  Given information to help with situation, encouraged to go to social services and as about programs to help him Ativan 1mg  BID no more than than at a time Due to financial hardship and anxiety, patient has take 3 a day at times.  Sent in atarax to take up to twice a day, may help with psoriasis as well.  Can not take more than 2 a day.   Can not tolerate effexor, zoloft, declines SSRI at this time No SI/HI   Continue diet and meds as discussed. Further disposition pending results of labs. Future Appointments  Date Time Provider Department Center  09/22/2019  3:30 PM Quentin Mullingollier, Aniesa Boback, PA-C [facility] None    HPI 58 y.o. male  presents for 3 month follow up with hypertension, hyperlipidemia, prediabetes and vitamin D.   His blood pressure has been controlled at home- has been 120-130, today their BP is BP: (!) 140/92.   He does workout. He denies chest pain, shortness  of breath, dizziness.   He has asthma/COPD, is following with pulmonary. He has quit his job due to SOB and is seeking disability, applied in April and denied in August. States he is homeless right now.  He has anxiety that affects his breathing, he has done a great job of cutting back from 2 mg TID to 1 mg BID.He has used all of his medication this month valium, states that he has been homeless waiting for disability. He is tearful.  He does not have insurance anymore due to quitting his job, he has history of PE, he has history of abnormal CT and is due but has declined due to cost.  BMI is Body mass index is 33.78 kg/m., he is working on diet and exercise. Wt Readings from Last 3 Encounters:  09/22/19 256 lb (116.1 kg)  07/16/19 255 lb (115.7 kg)  05/28/19 255 lb (115.7 kg)    He is on thyroid medication. His medication was not changed last visit however, 1 pill daily at night in the middle of the night.   Lab Results  Component Value Date   TSH 4.48 01/23/2019    He is on cholesterol medication and denies myalgias. His cholesterol is at goal. The cholesterol last visit was:   Lab Results  Component Value Date   CHOL 163 01/23/2019   HDL 36 (L) 01/23/2019   LDLCALC 103 (H) 01/23/2019   TRIG 141 01/23/2019   CHOLHDL 4.5 01/23/2019    He has  been working on diet and exercise for prediabetes, and denies foot ulcerations, hyperglycemia, hypoglycemia , increased appetite, nausea, paresthesia of the feet, polydipsia, polyuria, visual disturbances, vomiting and weight loss. Last A1C in the office was:  Lab Results  Component Value Date   HGBA1C 5.0 06/21/2017   Patient is on Vitamin D supplement.  Lab Results  Component Value Date   VD25OH 49 06/21/2017     Current Medications:  Current Outpatient Medications on File Prior to Visit  Medication Sig Dispense Refill  . amLODipine-olmesartan (AZOR) 5-40 MG tablet TAKE ONE TABLET BY MOUTH DAILY 30 tablet 0  . atorvastatin (LIPITOR) 40  MG tablet TAKE ONE TABLET BY MOUTH DAILY 90 tablet 0  . bisoprolol-hydrochlorothiazide (ZIAC) 10-6.25 MG tablet TAKE ONE TABLET BY MOUTH DAILY 90 tablet 0  . clobetasol ointment (TEMOVATE) 0.05 % Apply as needed    . Flaxseed, Linseed, (EQL FLAX SEED OIL) 1000 MG CAPS Take 1,000 mg by mouth daily.    . Fluticasone-Umeclidin-Vilant (TRELEGY ELLIPTA) 100-62.5-25 MCG/INH AEPB Inhale 1 puff into the lungs daily. 60 each 5  . levalbuterol (XOPENEX HFA) 45 MCG/ACT inhaler Inhale 2 puffs into the lungs every 6 (six) hours as needed for wheezing or shortness of breath. 1 Inhaler 12  . levothyroxine (SYNTHROID) 100 MCG tablet Take 1 tablet daily on an empty stomach with only water for 30 minutes & no Antacid meds, Calcium or Magnesium for 4 hours & avoid Biotin 90 tablet 0  . LORazepam (ATIVAN) 1 MG tablet TAKE 1/2 TO 1 TABLET BY MOUTH UP TO TWO TIMES DAILY ONLY IF NEEDED FOR ANXIETY ATTACK (LIMIT TO 5 DAYS PER WEEK TO AVOID ADDICTION) 60 tablet 0  . Multiple Vitamins-Minerals (MULTIVITAMIN WITH MINERALS) tablet Take 1 tablet by mouth daily.    . Omega-3 Fatty Acids (FISH OIL) 1000 MG CAPS Take 1,000 mg by mouth daily.     No current facility-administered medications on file prior to visit.     Medical History:  Past Medical History:  Diagnosis Date  . Asthma    Present all life. Dx as child. Been in ICS since 2003--pft 06/03/10 FEV1 3.14/ 82%, FEV1/FVC 0.54; insig resp to BD, DLCO 75%, ni lUNG  Vol  . COPD (chronic obstructive pulmonary disease) ([place])   . Detached retina   . History of TB skin testing    negative 2005-2010  . Hyperlipidemia   . Hypertension    on lisinopril 2002/2003--changed ARB march 2011  . Lung nodule    38mm on ct 11/10/10- likely lymph node  . Palpitations    started atenolol since 2003. recollects hx of holter. 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 informed it "Not a dangerous condition"-changed to lopressor 12/15/2010  . Psoriasis    on enbre;l since 005. started in [place].  Stopped 2011 by [place] derm due to nodule on chest  . Pulmonary embolism ([place])    does not recollect tpA or being on lot of o2 or hypotension. 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 Ace Inhibitors     cough  . Haldol [Haloperidol Lactate]     psychosis  . Haloperidol Lactate     REACTION: phsycotic break     Review of Systems:  Review of Systems  Constitutional: Negative for chills, diaphoresis and fever.  HENT: Negative for congestion, ear pain and sore throat.   Eyes: Negative for blurred vision and  double vision.  Respiratory: Positive for shortness of breath. Negative for cough and wheezing.   Cardiovascular: Positive for palpitations. Negative for chest pain and leg swelling.  Gastrointestinal: Positive for heartburn. Negative for blood in stool, constipation, diarrhea and melena.  Genitourinary: Negative.   Neurological: Negative for headaches.  Psychiatric/Behavioral: The patient is nervous/anxious.     Family history- Review and unchanged  Social history- Review and unchanged  Physical Exam: BP (!) 140/92   Pulse 80   Temp (!) 97.5 F (36.4 C)   Ht 6\' 1"  (1.854 m)   Wt 256 lb (116.1 kg)   SpO2 98%   BMI 33.78 kg/m  Wt Readings from Last 3 Encounters:  09/22/19 256 lb (116.1 kg)  07/16/19 255 lb (115.7 kg)  05/28/19 255 lb (115.7 kg)    General Appearance: Well nourished well developed, in no apparent distress. Eyes: PERRLA, EOMs, conjunctiva no swelling or erythema ENT/Mouth: Ear canals normal without obstruction, swelling, erythma, discharge.  TMs normal bilaterally.  Oropharynx moist, clear, without exudate, or postoropharyngeal swelling. Neck: Supple, thyroid normal,no cervical adenopathy  Respiratory: Respiratory effort normal, Breath sounds clear A&P without rhonchi, wheeze, or rale.  No retractions, no accessory usage. Cardio: RRR with frequent PVC's, no MRGs.  Brisk peripheral pulses without edema.  Abdomen: Soft, + BS,  Non tender, no guarding, rebound, hernias, masses. Musculoskeletal: Full ROM, 5/5 strength, Normal gait Skin: Warm, dry without rashes, lesions, ecchymosis.  Neuro: Awake and oriented X 3, Cranial nerves intact. Normal muscle tone, no cerebellar symptoms. Psych: Normal affect, Insight and Judgment appropriate.    07/28/19, PA-C 3:14 PM [facility],

## 2019-09-22 ENCOUNTER — Encounter: Payer: Self-pay | Admitting: Physician Assistant

## 2019-09-22 ENCOUNTER — Ambulatory Visit: Payer: Self-pay | Admitting: Physician Assistant

## 2019-09-22 ENCOUNTER — Other Ambulatory Visit: Payer: Self-pay

## 2019-09-22 VITALS — BP 140/92 | HR 80 | Temp 97.5°F | Ht 73.0 in | Wt 256.0 lb

## 2019-09-22 DIAGNOSIS — Z79899 Other long term (current) drug therapy: Secondary | ICD-10-CM

## 2019-09-22 DIAGNOSIS — E559 Vitamin D deficiency, unspecified: Secondary | ICD-10-CM

## 2019-09-22 DIAGNOSIS — J449 Chronic obstructive pulmonary disease, unspecified: Secondary | ICD-10-CM

## 2019-09-22 DIAGNOSIS — Z86711 Personal history of pulmonary embolism: Secondary | ICD-10-CM

## 2019-09-22 DIAGNOSIS — J4489 Other specified chronic obstructive pulmonary disease: Secondary | ICD-10-CM

## 2019-09-22 DIAGNOSIS — E039 Hypothyroidism, unspecified: Secondary | ICD-10-CM

## 2019-09-22 DIAGNOSIS — F411 Generalized anxiety disorder: Secondary | ICD-10-CM

## 2019-09-22 DIAGNOSIS — R7309 Other abnormal glucose: Secondary | ICD-10-CM

## 2019-09-22 DIAGNOSIS — E782 Mixed hyperlipidemia: Secondary | ICD-10-CM

## 2019-09-22 DIAGNOSIS — I10 Essential (primary) hypertension: Secondary | ICD-10-CM

## 2019-09-22 MED ORDER — HYDROXYZINE HCL 50 MG PO TABS: ORAL_TABLET | ORAL | 0 refills | Status: DC

## 2019-09-22 MED ORDER — LORAZEPAM 1 MG PO TABS: ORAL_TABLET | ORAL | 0 refills | Status: DC

## 2019-09-23 ENCOUNTER — Encounter: Payer: Self-pay | Admitting: Physician Assistant

## 2019-10-01 ENCOUNTER — Telehealth: Payer: Self-pay | Admitting: Internal Medicine

## 2019-10-01 NOTE — Telephone Encounter (Signed)
I personally have not seen Samuel Sharp in some years. So though he saw [name] in July 2020 is probably best that makes a 30 min visit with me and I do the letter after I talk to him and in that visit itself

## 2019-10-01 NOTE — Telephone Encounter (Signed)
Call returned to patient, he is questing a letter to be sent to his attorney addressing his condition and his treatment plan. Lawyer info: Attorney Jan-Dils, AttEstill Bamberg or Maud at [phone].   Last provider seen was [place], will have to send to MR. Patient aware he will not return to clinic until Monday.   MR please advise. Are you willing to write a letter addressing patients condition or will he need a OV, trying to apply for disability.

## 2019-10-01 NOTE — Telephone Encounter (Signed)
Call returned to patient, he state he does not have any insurance and he already has a bill with [place]. He states he has not worked since January. I made him aware he could be billed for the visit but in order to get the letter he would need to be seen in clinic. Voiced understanding. Appt made. Nothing further needed at this time.

## 2019-10-06 ENCOUNTER — Other Ambulatory Visit: Payer: Self-pay | Admitting: Physician Assistant

## 2019-10-09 ENCOUNTER — Ambulatory Visit (INDEPENDENT_AMBULATORY_CARE_PROVIDER_SITE_OTHER): Payer: Self-pay | Admitting: Internal Medicine

## 2019-10-09 ENCOUNTER — Encounter: Payer: Self-pay | Admitting: Internal Medicine

## 2019-10-09 ENCOUNTER — Other Ambulatory Visit: Payer: Self-pay

## 2019-10-09 ENCOUNTER — Encounter: Payer: Self-pay | Admitting: Emergency Medicine

## 2019-10-09 VITALS — BP 148/86 | HR 68 | Temp 97.0°F | Ht 73.0 in | Wt 253.8 lb

## 2019-10-09 DIAGNOSIS — R0600 Dyspnea, unspecified: Secondary | ICD-10-CM

## 2019-10-09 DIAGNOSIS — R0609 Other forms of dyspnea: Secondary | ICD-10-CM

## 2019-10-09 DIAGNOSIS — J449 Chronic obstructive pulmonary disease, unspecified: Secondary | ICD-10-CM

## 2019-10-09 DIAGNOSIS — R911 Solitary pulmonary nodule: Secondary | ICD-10-CM

## 2019-10-09 DIAGNOSIS — Z0271 Encounter for disability determination: Secondary | ICD-10-CM

## 2019-10-09 MED ORDER — TRELEGY ELLIPTA 100-62.5-25 MCG/INH IN AEPB: 1.0000 | INHALATION_SPRAY | Freq: Every day | RESPIRATORY_TRACT | 0 refills | Status: DC

## 2019-10-09 NOTE — Addendum Note (Signed)
Addended by: Collier Salina on: 10/09/2019 12:48 PM   Modules accepted: Orders

## 2019-10-09 NOTE — Progress Notes (Signed)
#- Asthma present all life. Diagnosed in childhood. Has had childhood admissions. Been on ICS since 2003 only    -  PFT 06/03/10 FEV1 3.14/ 82%, FEV1/FVC 0.54; insig resp to BD, DLCO 75%, Nl Lung Vol (done while on ICS)   - CT Nov 2011 and May 2012 with emphysema +  ##Pulmonary Embolism 2003    - Does not recollect  tpA or being on lot of oxygen or hypotension. 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 Started in [place]. Stopped 2011 by [place] derm due to nodule on ct chest 11/10/2010  - Restared humira 2012. Stopped again July 2013 fllowing empyema/pna  # #RLL Nodule 65m on CT 11/10/2010:  likely lymph node.  CT  05/19/2011:   5 mm nodule in the right lower lobe,  adjacent to the major fissure (image 36), is unchanged  - refused followup early 2013 due to social stressors (  #TB Skin test negative    - 2005 - 2010, annual checks    OV 08/16/2012 Says that since earlyu July 2013 had cough, feeling unwell, malaise that was mild and place humira on hold. 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 Then on 07/18/12 had acute pleurisy. C 07/19/12 showed RMZ dense consolidation and loculated fluid (thoracentesis negative for cytology). Admitted. CVTS felt no need for VATS. Rx medically. Bronch apparently non-diagnostic. Discharged on 07/29/12 and finsihed 07/31/12. Followup with NP 8/15 and better. CXR 08/09/12 showed continued improvement. Since 8/15, continues to improve - pain rt pleuritic pain less, weakness better, strength improved, feels ready to go back to work. Really keen on going back to work.  However, still with symptoms - main is mild pleuritic pain early in the morning. Says he stil lhas dyspnea though. PEF dropped from 600s to 400s after illness.  Also  concerned about illlness related 20# wt loos Taking advair as schedule for past 8 years. STarted on spiriva on 08/08/12 but seems no help.  Of note; not smoking cigs but only doin occ e-cigs. 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 Financial trouble  +. Lost home +   #Empyema and pneumonia  - this is improving on cxr 08/09/12  - lot of symptoms that you have like fatigue, pain, shortness of breath are due to loss of fitness from this and this should gradually improve with steady conditioning  - stay off humira for now  - please have followup cxr in 1 months and return to see me  #Prior smoking and asthma/copd  - in 1 month have spirometry with TJune Leap - continue spiriva and advair  #Followup  1 month [facility]  Followup pneumonia, copd/asthma, and smoking  Pneumonia: He did not return as scheduled for folllowup (see above). He did see PMD earlier this month and CXR 10/29/12 - continued improvement since August 2013 but still has RLL infiltrate medial aspect; mild persistence.   COPD/asthma: We did Spirometry today fev1 2.48L/61%, Ratio 52 and c/w moderate obstruction and this is worse than his baseline. He is having some cough and dyspnea which is worse than  baseline but is improved compared to his pneumonia. He is not taking spiriva. He is only doing ICS/LABA.  He is keen on restarting humira for his worsening psoriasis but I have asked him to hold off.   Smoking: He continues to smoke e-cigs   OV 09/10/2017 Moderate-Severe COPD w/ Emphysema: Transitioned to Trelegy inhaler at last appointment. He reports his baseline dyspnea. He does report a frequent cough producing a clear mucus. No wheezing. He reports he is adherent to the Trelegy inhaler. He uses his rescue inhaler 3-4 times a week at most without any perceived symptomatic benefit.  Multiple Lung Nodules: Nodular opacity  within right lower lobe as well as 6 mm nodule identified in 2014. Opacification is decreased in size as a most recent CT imaging in June but the patient now has subcentimeter nodules within right upper lobe that have appeared as new compared with previous CT imaging.  GERD:  Protonix 23m daily added to patient's regimen of Zantac at last appointment. He reports he never took the Protonix due to the cost. He is still having intermittent reflux but denies any reflux into the back of his throat. He is taking 3057mof Zantac at night as well as one additional dose during the day.  Review of Systems No chest pain, pressure, or tightness. Does have frequent heart palpitations. No syncope or near syncope. No abdominal pain, nausea, or emesi  OV 10/09/2019  Subjective:  Patient ID: Samuel Sharp , DOB: [birth year] age 58.o. , MRN: [id number] [place] [id number] 10/09/2019 -   Chief Complaint  Patient presents with   Follow-up    Pt here for letter for attorney for disability. Pt states SOB has worsned since last OV. Pt c/o prod cough with clear mucus. Pt deneis CP/tightness and f/c/s. Refused flu shot.   Follow-up for multiple issues  HPI [name] 58 y.o. -   Gold stage II COPD: This is stable.  He is on Symbicort.  He used to be on Trelegy but cannot afford it anymore.  There is no flareup.  He refused to have the flu shot with the office nurse but to me he said he will have the flu shot.  We will offer this to him again today and see what he says   Right lower lobe nodule: Last seen in 2018.  He needed a repeat CT scan of the chest in 6 months but is not done this.  I have recommended that he have a CT scan of the chest at this visit.  New issue of shortness of breath: This is deteriorated.  He now has class III dyspnea.  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  He is not able to climb the ladder.  Even climbing up and down the stairs for the truck is making it difficult.  He is now interested in applying for disability.  There is no orthopnea proximal nocturnal dyspnea or chest pains.  He has not had a echocardiogram or stress test.  He has gained over 20 pounds of weight in the last few years.  Walking desaturation test 185 feet x 3 laps on room air: He was only able to walk 1 lap and pulse ox went down to  92% and he did not desaturate.  He is surprised by this.  In July 2020 he had a pulmonary function test.  This shows still stage II COPD without any change in DLCO.  He thinks he has had a significant decline because of the slight drop in FEV1.   New issue of disability application: Because of his significant shortness of breath and COPD he is finding it difficult to do his work.  He wants apply for disability.  He asked me to write a support letter for this.  Results for LATRAVION, GRAVES (MRN [id number]) as of 10/09/2019 09:08  Ref. Range 07/13/2017 14:20 07/16/2019 08:53  FEV1-Post Latest Units: L 2.38 2.14  FEV1-%Pred-Post Latest Units: % 58 53   Results for TELFORD, ARCHAMBEAU (MRN [id number]) as of 10/09/2019 09:08  Ref. Range 07/13/2017 14:20 07/16/2019 08:53  DLCO unc Latest Units: ml/min/mmHg 18.92 15.45  DLCO unc % pred Latest Units: % 52 51   IMPRESSION: - CT chest lst June 2018 The previously seen pleural based right lower lobe nodule has decreased in size since prior study. I favor this represents area of residual scarring. 6 mm right lower lobe nodule on image 92 is stable. There are new right upper lobe nodules as described above which warrant follow-up. Recommend repeat CT in 6-12 months.  Coronary artery disease.  Aortic Atherosclerosis (ICD10-I70.0) and Emphysema (ICD10-J43.9).   Electronically Signed   By: Rolm Baptise M.D.   On: 06/21/2017 08:43   ROS - per HPI     has a past  medical history of Asthma, COPD (chronic obstructive pulmonary disease) ([place]), Detached retina, History of TB skin testing, Hyperlipidemia, Hypertension, Lung nodule, Palpitations, Psoriasis, and Pulmonary embolism ([place]).   reports that he quit smoking about 9 years ago. His smoking use included cigarettes. He has a 70.00 pack-year smoking history. He has never used smokeless tobacco.  Past Surgical History:  Procedure Laterality Date   RETINAL DETACHMENT SURGERY     THORACENTESIS     VIDEO BRONCHOSCOPY  07/26/2012   Procedure: VIDEO BRONCHOSCOPY WITH FLUORO;  Surgeon: Rush Farmer, MD;  Location: [facility];  Service: Cardiopulmonary;  Laterality: Bilateral;    Allergies  Allergen Reactions   Ace Inhibitors     cough   Haldol [Haloperidol Lactate]     psychosis   Haloperidol Lactate     REACTION: phsycotic break    Immunization History  Administered Date(s) Administered   Influenza Split 11/15/2012   Influenza Whole 09/24/2010   Pneumococcal Conjugate-13 05/31/2017   Pneumococcal Polysaccharide-23 06/03/2010   Tdap 06/12/2016    Family History  Problem Relation Age of Onset   Emphysema Father    Asthma Mother    Allergies Mother    Alzheimer's disease Mother    Alzheimer's disease Maternal Aunt      Current Outpatient Medications:    amLODipine-olmesartan (AZOR) 5-40 MG tablet, TAKE ONE TABLET BY MOUTH DAILY, Disp: 30 tablet, Rfl: 0   atorvastatin (LIPITOR) 40 MG tablet, TAKE ONE TABLET BY MOUTH DAILY, Disp: 90 tablet, Rfl: 0   bisoprolol-hydrochlorothiazide (ZIAC) 10-6.25 MG tablet, TAKE ONE TABLET BY MOUTH DAILY, Disp: 90 tablet, Rfl: 0   budesonide-formoterol (SYMBICORT) 160-4.5 MCG/ACT inhaler, Inhale 2 puffs into the lungs 2 (two) times daily., Disp: , Rfl:    clobetasol ointment (TEMOVATE) 0.05 %, Apply as needed, Disp: , Rfl:    Flaxseed, Linseed, (EQL FLAX SEED OIL) 1000 MG CAPS, Take 1,000 mg by mouth daily., Disp: , Rfl:  levalbuterol (XOPENEX HFA) 45 MCG/ACT inhaler, Inhale 2 puffs into the lungs every 6 (six) hours as needed for wheezing or shortness of breath., Disp: 1 Inhaler, Rfl: 12   levothyroxine (SYNTHROID) 100 MCG tablet, Take 1 tablet daily on an empty stomach with only water for 30 minutes & no Antacid meds, Calcium or Magnesium for 4 hours & avoid Biotin, Disp: 90 tablet, Rfl: 0   LORazepam (ATIVAN) 1 MG tablet, TAKE 1/2 TO 1 TABLET BY MOUTH UP TO TWO TIMES DAILY ONLY IF NEEDED FOR ANXIETY ATTACK, Disp: 60 tablet, Rfl: 0   Multiple Vitamins-Minerals (MULTIVITAMIN WITH MINERALS) tablet, Take 1 tablet by mouth daily., Disp: , Rfl:    Omega-3 Fatty Acids (FISH OIL) 1000 MG CAPS, Take 1,000 mg by mouth daily., Disp: , Rfl:    Fluticasone-Umeclidin-Vilant (TRELEGY ELLIPTA) 100-62.5-25 MCG/INH AEPB, Inhale 1 puff into the lungs daily. (Patient not taking: Reported on 10/09/2019), Disp: 60 each, Rfl: 5   hydrOXYzine (ATARAX/VISTARIL) 50 MG tablet, 1/2-1 tablet AS needed for anxiety/itching BID, Disp: 60 tablet, Rfl: 0      Objective:   Vitals:   10/09/19 0853  BP: (!) 148/86  Pulse: 68  Temp: (!) 97 F (36.1 C)  TempSrc: Skin  SpO2: 93%  Weight: 253 lb 12.8 oz (115.1 kg)  Height: '6\' 1"'  (1.854 m)    Estimated body mass index is 33.48 kg/m as calculated from the following:   Height as of this encounter: '6\' 1"'  (1.854 m).   Weight as of this encounter: 253 lb 12.8 oz (115.1 kg).  '@WEIGHTCHANGE' @  [employer]   10/09/19 0853  Weight: 253 lb 12.8 oz (115.1 kg)     Physical Exam  General Appearance:    Alert, cooperative, no distress, appears stated age - no , Deconditioned looking - yes , OBESE  - yes, Sitting on Wheelchair -  no  Head:    Normocephalic, without obvious abnormality, atraumatic  Eyes:    PERRL, conjunctiva/corneas clear,  Ears:    Normal TM's and external ear canals, both ears  Nose:   Nares normal, septum midline, mucosa normal, no drainage    or sinus tenderness.  OXYGEN ON  - no . Patient is @ ra   Throat:   Lips, mucosa, and tongue normal; teeth and gums normal. Cyanosis on lips - no  Neck:   Supple, symmetrical, trachea midline, no adenopathy;    thyroid:  no enlargement/tenderness/nodules; no carotid   bruit or JVD  Back:     Symmetric, no curvature, ROM normal, no CVA tenderness  Lungs:     Distress - no , Wheeze no, Barrell Chest - no, Purse lip breathing - no, Crackles - no   Chest Wall:    No tenderness or deformity.    Heart:    Regular rate and rhythm, S1 and S2 normal, no rub   or gallop, Murmur - no  Breast Exam:    NOT DONE  Abdomen:     Soft, non-tender, bowel sounds active all four quadrants,    no masses, no organomegaly. Visceral obesity - yes  Genitalia:   NOT DONE  Rectal:   NOT DONE  Extremities:   Extremities - normal, Has Cane - no, Clubbing - no, Edema - no  Pulses:   2+ and symmetric all extremities  Skin:   Stigmata of Connective Tissue Disease - no  Lymph nodes:   Cervical, supraclavicular, and axillary nodes normal  Psychiatric:  Neurologic:   Pleasant - yes, Anxious -  no, Flat affect - no  CAm-ICU - neg, Alert and Oriented x 3 - yes, Moves all 4s - yes, Speech - normal, Cognition - intact           Assessment:       ICD-10-CM   1. Dyspnea on exertion  R06.00   2. Stage 2 moderate COPD by GOLD classification ([place])  J44.9   3. Disability examination  Z02.71   4. Nodule of lower lobe of right lung  R91.1        Plan:     Patient Instructions  Dyspnea on exertion  - Shortness of breath is out of proportion to your lung problem and could be related to weight gain and possible stiff heart/weak heart in addition to your COPD.  There could also be underlying undiagnosed lung diseases such as pulmonary fibrosis  Plan  -Do high-resolution CT chest supine and prone -Do 2D echocardiogram  Stage 2 moderate COPD by GOLD classification ([place])  -Clinically stable without any exacerbation  Plan -Okay to continue  Symbicort but you can use a Trelegy sample from [place]  -Albuterol as needed -Flu shot today    Nodule of lower lobe of right lung  -Last CT scan of the chest was in June 2018.  He required a repeat CT chest.  The status of this nodule can be captured in the high-resolution CT chest being done for shortness of breath   Disability examination  -I have given you a letter for disability application  Follow-up -Return to see [place] to discuss the echocardiogram and high-resolution CT in the next few to several weeks -         SIGNATURE    Dr. Brand Males, M.D., F.C.C.P,  Pulmonary and Critical Care Medicine Staff Physician, [place] Director - Interstitial Lung Disease  Program  Pulmonary [place] [facility] [place], [place], [address]  Pager: [phone], If no answer or between  15:00h - 7:00h: call [phone] Telephone: [phone]  9:34 AM 10/09/2019

## 2019-10-09 NOTE — Patient Instructions (Signed)
Dyspnea on exertion  - Shortness of breath is out of proportion to your lung problem and could be related to weight gain and possible stiff heart/weak heart in addition to your COPD.  There could also be underlying undiagnosed lung diseases such as pulmonary fibrosis  Plan  -Do high-resolution CT chest supine and prone -Do 2D echocardiogram  Stage 2 moderate COPD by GOLD classification ([place])  -Clinically stable without any exacerbation  Plan -Okay to continue Symbicort but you can use a Trelegy sample from [place]  -Albuterol as needed -Flu shot today    Nodule of lower lobe of right lung  -Last CT scan of the chest was in June 2018.  He required a repeat CT chest.  The status of this nodule can be captured in the high-resolution CT chest being done for shortness of breath   Disability examination  -I have given you a letter for disability application  Follow-up -Return to see [place] to discuss the echocardiogram and high-resolution CT in the next few to several weeks -

## 2019-10-09 NOTE — Addendum Note (Signed)
Addended by: Collier Salina on: 10/09/2019 09:45 AM   Modules accepted: Orders

## 2019-10-13 ENCOUNTER — Ambulatory Visit (HOSPITAL_COMMUNITY): Payer: Medicaid Other | Attending: Cardiovascular Disease

## 2019-10-13 ENCOUNTER — Other Ambulatory Visit: Payer: Self-pay

## 2019-10-13 DIAGNOSIS — R0609 Other forms of dyspnea: Secondary | ICD-10-CM

## 2019-10-13 DIAGNOSIS — R0600 Dyspnea, unspecified: Secondary | ICD-10-CM | POA: Diagnosis present

## 2019-10-14 ENCOUNTER — Telehealth: Payer: Self-pay | Admitting: Internal Medicine

## 2019-10-14 NOTE — Telephone Encounter (Signed)
Spoke with pt, aware of results/recs.  Pt wishes to follow up with PCP regarding this matter.  Forwarded echo results to PCP at pt's request.  Nothing further needed at this time- will close encounter.

## 2019-10-14 NOTE — Telephone Encounter (Signed)
Let Samuel Sharp know that echocardiogram is nearly normal but he does have stiffness of the heart muscle which can be due to weight gain and aging and other factors.  Definitely this can be contributing to his shortness of breath with exertion relieved by rest.  In addition there is a mild dilatation of the ascending aorta -this sometimes requires just follow-up.  We will have better definition of this in the CT scan of the chest when he does that  Plan -Refer to cardiology or he should take this up with his primary care physician      SIGNATURE    Dr. Brand Males, M.D., F.C.C.P,  Pulmonary and Critical Care Medicine Staff Physician, [place] Director - Interstitial Lung Disease  Program  Pulmonary [facility] at [place], [place], [address]  Pager: [phone], If no answer or between  15:00h - 7:00h: call [phone] Telephone: [phone]  3:45 PM 10/14/2019

## 2019-10-21 ENCOUNTER — Telehealth: Payer: Self-pay | Admitting: Internal Medicine

## 2019-10-21 NOTE — Telephone Encounter (Signed)
Noted, thanks Vallarie Mare! Forwarding to MR as FYI.

## 2019-10-22 NOTE — Telephone Encounter (Signed)
Ok thanks 

## 2019-10-27 ENCOUNTER — Other Ambulatory Visit: Payer: Self-pay | Admitting: Physician Assistant

## 2019-10-27 DIAGNOSIS — F411 Generalized anxiety disorder: Secondary | ICD-10-CM

## 2019-10-30 ENCOUNTER — Other Ambulatory Visit: Payer: Self-pay

## 2019-11-06 ENCOUNTER — Other Ambulatory Visit: Payer: Self-pay | Admitting: Physician Assistant

## 2019-11-24 ENCOUNTER — Other Ambulatory Visit: Payer: Self-pay | Admitting: Adult Health

## 2019-11-24 DIAGNOSIS — F411 Generalized anxiety disorder: Secondary | ICD-10-CM

## 2019-11-28 ENCOUNTER — Other Ambulatory Visit: Payer: Self-pay

## 2019-11-28 MED ORDER — BISOPROLOL-HYDROCHLOROTHIAZIDE 10-6.25 MG PO TABS: 1.0000 | ORAL_TABLET | Freq: Every day | ORAL | 0 refills | Status: DC

## 2019-12-17 ENCOUNTER — Telehealth: Payer: Self-pay | Admitting: Physician Assistant

## 2019-12-17 MED ORDER — AZITHROMYCIN 250 MG PO TABS: ORAL_TABLET | ORAL | 1 refills | Status: AC

## 2019-12-17 NOTE — Telephone Encounter (Signed)
-----   Message from Elenor Quinones, [place] sent at 12/17/2019  1:45 PM EST ----- Regarding: office note Contact: [phone] Pt would like to know if you will call him something into his pharmacy to help clear his lungs up  ANTIBIOTIC   He states you have done this before.   Samuel Sharp [employer]  Please and thank you!

## 2019-12-23 ENCOUNTER — Other Ambulatory Visit: Payer: Self-pay | Admitting: Physician Assistant

## 2019-12-23 DIAGNOSIS — F411 Generalized anxiety disorder: Secondary | ICD-10-CM

## 2020-01-21 ENCOUNTER — Other Ambulatory Visit: Payer: Self-pay | Admitting: Physician Assistant

## 2020-01-21 DIAGNOSIS — F411 Generalized anxiety disorder: Secondary | ICD-10-CM

## 2020-01-28 ENCOUNTER — Ambulatory Visit: Payer: Self-pay | Admitting: Physician Assistant

## 2020-02-19 ENCOUNTER — Other Ambulatory Visit: Payer: Self-pay | Admitting: Physician Assistant

## 2020-02-19 DIAGNOSIS — F411 Generalized anxiety disorder: Secondary | ICD-10-CM

## 2020-02-24 NOTE — Progress Notes (Signed)
Patient ID: Samuel Sharp, male   DOB: October 04, 1961, 59 y.o.   MRN: [id number]  Assessment and Plan:  Hypertension:  -Continue medication, azor samples given- DECLINES CHANGING TO CHEAPER MEDICATION -monitor blood pressure at home.  -Continue DASH diet.   -Reminder to go to the ER if any CP, SOB, nausea, dizziness, severe HA, changes vision/speech, left arm numbness and tingling, and jaw pain.  Cholesterol: -Continue diet and exercise.   Hypothyroidism - continue medications the same, reminded to take on an empty stomach 30-7mins before food.   Obesity with co morbidities - long discussion about weight loss, diet, and exercise  COPD -patient states he is SOB with any movement including showering, ADL's,  following with pulmonary -trelegy samples out in the office, given symbicort samples, 2 months worth  Had CT chest 05/2017- needs repeat however no insurance at this time  Anxiety  now has a place to stay, waiting on disability to go through but was denied.  Given information to help with situation, encouraged to go to social services and as about programs to help him Ativan 1mg  BID no more than than at a time Due to financial hardship and anxiety, patient has take 3 a day at times.  Sent in atarax to take up to twice a day, may help with psoriasis as well.  Can not take more than 2 a day.   Can not tolerate effexor, zoloft, declines SSRI at this time No SI/HI   Continue diet and meds as discussed. Further disposition pending results of labs. No future appointments.  HPI 59 y.o. male  presents for 3 month follow up with hypertension, hyperlipidemia, prediabetes and vitamin D.   His blood pressure has been controlled at home- has been 120-130, today their BP is BP: 136/74.   He does workout. He denies chest pain, dizziness, he continues to have trouble breathing.   He has asthma/COPD, is following with pulmonary. He has quit his job due to SOB, trouble with ADLS like showering due  to SOB and is seeking disability, applied in April and denied in August, has an appeal. Has place to stay at this time.   He has anxiety that affects his breathing, he has done a great job of cutting back from 2 mg TID to 1 mg BID.Will not get more than that and he understands.   He does not have insurance anymore due to quitting his job, he has history of PE, he has history of abnormal CT and is due but has declined due to cost. He did have an echocardiogram 09/2019 that showed EF60-65%, mild AR, normal valves otherwise and mild aortic dilatation.   BMI is Body mass index is 32.72 kg/m., he is working on diet and exercise. Wt Readings from Last 3 Encounters:  02/25/20 248 lb (112.5 kg)  10/09/19 253 lb 12.8 oz (115.1 kg)  09/22/19 256 lb (116.1 kg)   He is on thyroid medication. His medication was not changed last visit however, has missed some doses, 1 pill daily but will take when he thinks of it.  Lab Results  Component Value Date   TSH 4.48 01/23/2019    He is on cholesterol medication and denies myalgias. His cholesterol is at goal. The cholesterol last visit was:   Lab Results  Component Value Date   CHOL 163 01/23/2019   HDL 36 (L) 01/23/2019   LDLCALC 103 (H) 01/23/2019   TRIG 141 01/23/2019   CHOLHDL 4.5 01/23/2019    He has  been working on diet and exercise for prediabetes, and denies foot ulcerations, hyperglycemia, hypoglycemia , increased appetite, nausea, paresthesia of the feet, polydipsia, polyuria, visual disturbances, vomiting and weight loss. Last A1C in the office was:  Lab Results  Component Value Date   HGBA1C 5.0 06/21/2017   Patient is on Vitamin D supplement.  Lab Results  Component Value Date   VD25OH 71 06/21/2017     Current Medications:  Current Outpatient Medications on File Prior to Visit  Medication Sig Dispense Refill  . amLODipine-olmesartan (AZOR) 5-40 MG tablet TAKE ONE TABLET BY MOUTH DAILY 30 tablet 3  . atorvastatin (LIPITOR) 40 MG  tablet TAKE ONE TABLET BY MOUTH DAILY 90 tablet 0  . bisoprolol-hydrochlorothiazide (ZIAC) 10-6.25 MG tablet Take 1 tablet by mouth daily. 90 tablet 0  . budesonide-formoterol (SYMBICORT) 160-4.5 MCG/ACT inhaler Inhale 2 puffs into the lungs 2 (two) times daily.    . clobetasol ointment (TEMOVATE) 0.05 % Apply as needed    . Flaxseed, Linseed, (EQL FLAX SEED OIL) 1000 MG CAPS Take 1,000 mg by mouth daily.    . Fluticasone-Umeclidin-Vilant (TRELEGY ELLIPTA) 100-62.5-25 MCG/INH AEPB Inhale 1 puff into the lungs daily. 60 each 5  . hydrOXYzine (ATARAX/VISTARIL) 50 MG tablet 1/2-1 tablet AS needed for anxiety/itching BID 60 tablet 0  . levalbuterol (XOPENEX HFA) 45 MCG/ACT inhaler Inhale 2 puffs into the lungs every 6 (six) hours as needed for wheezing or shortness of breath. 1 Inhaler 12  . levothyroxine (SYNTHROID) 100 MCG tablet Take 1 tablet daily on an empty stomach with only water for 30 minutes & no Antacid meds, Calcium or Magnesium for 4 hours & avoid Biotin 90 tablet 0  . LORazepam (ATIVAN) 1 MG tablet TAKE 1/2 TO 1 TABLET BY MOUTH UP TO TWO TIMES A DAY AS NEEDED FOR ANXIETY ATTACK 60 tablet 0  . Multiple Vitamins-Minerals (MULTIVITAMIN WITH MINERALS) tablet Take 1 tablet by mouth daily.    . Omega-3 Fatty Acids (FISH OIL) 1000 MG CAPS Take 1,000 mg by mouth daily.     No current facility-administered medications on file prior to visit.    Medical History:  Past Medical History:  Diagnosis Date  . Asthma    Present all life. Dx as child. Been in ICS since 2003--pft 06/03/10 FEV1 3.14/ 82%, FEV1/FVC 0.54; insig resp to BD, DLCO 75%, ni lUNG  Vol  . COPD (chronic obstructive pulmonary disease) ([place])   . Detached retina   . History of TB skin testing    negative 2005-2010  . Hyperlipidemia   . Hypertension    on lisinopril 2002/2003--changed ARB march 2011  . Lung nodule    83mm on ct 11/10/10- likely lymph node  . Palpitations    started atenolol since 2003. recollects hx of holter.  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 informed it "Not a dangerous condition"-changed to lopressor 12/15/2010  . Psoriasis    on enbre;l since 005. started in [place]. Stopped 2011 by [facility] derm due to nodule on chest  . Pulmonary embolism ([place])    does not recollect tpA or being on lot of o2 or hypotension. 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 Ace Inhibitors     cough  . Haldol [Haloperidol Lactate]     psychosis  . Haloperidol Lactate     REACTION: phsycotic break     Review of Systems:  Review of Systems  Constitutional: Negative for chills, diaphoresis and fever.  HENT: Negative for congestion, ear pain and sore throat.   Eyes: Negative for blurred vision and double vision.  Respiratory: Positive for shortness of breath. Negative for cough and wheezing.   Cardiovascular: Positive for palpitations. Negative for chest pain and leg swelling.  Gastrointestinal: Positive for heartburn. Negative for blood in stool, constipation, diarrhea and melena.  Genitourinary: Negative.   Neurological: Negative for headaches.  Psychiatric/Behavioral: The patient is nervous/anxious.     Family history- Review and unchanged  Social history- Review and unchanged  Physical Exam: BP 136/74   Pulse 66   Temp (!) 97.4 F (36.3 C)   Wt 248 lb (112.5 kg)   SpO2 94%   BMI 32.72 kg/m  Wt Readings from Last 3 Encounters:  02/25/20 248 lb (112.5 kg)  10/09/19 253 lb 12.8 oz (115.1 kg)  09/22/19 256 lb (116.1 kg)    General Appearance: Well nourished well developed, in no apparent distress. Eyes: PERRLA, EOMs, conjunctiva no swelling or erythema ENT/Mouth: Ear canals normal without obstruction, swelling, erythma, discharge.  TMs normal bilaterally.  Oropharynx moist, clear, without exudate, or postoropharyngeal swelling. Neck: Supple, thyroid normal,no cervical adenopathy  Respiratory: Respiratory effort  normal, Breath sounds clear A&P without rhonchi, wheeze, or rale.  No retractions, no accessory usage. Cardio: RRR with frequent PVC's, no MRGs. Brisk peripheral pulses without edema.  Abdomen: Soft, + BS,  Non tender, no guarding, rebound, hernias, masses. Musculoskeletal: Full ROM, 5/5 strength, Normal gait Skin: Warm, dry without rashes, lesions, ecchymosis.  Neuro: Awake and oriented X 3, Cranial nerves intact. Normal muscle tone, no cerebellar symptoms. Psych: Normal affect, Insight and Judgment appropriate.    Quentin Mulling, PA-C 10:44 AM [facility],

## 2020-02-25 ENCOUNTER — Ambulatory Visit (INDEPENDENT_AMBULATORY_CARE_PROVIDER_SITE_OTHER): Payer: Self-pay | Admitting: Physician Assistant

## 2020-02-25 ENCOUNTER — Other Ambulatory Visit: Payer: Self-pay

## 2020-02-25 ENCOUNTER — Encounter: Payer: Self-pay | Admitting: Physician Assistant

## 2020-02-25 VITALS — BP 136/74 | HR 66 | Temp 97.4°F | Wt 248.0 lb

## 2020-02-25 DIAGNOSIS — E039 Hypothyroidism, unspecified: Secondary | ICD-10-CM

## 2020-02-25 DIAGNOSIS — I10 Essential (primary) hypertension: Secondary | ICD-10-CM

## 2020-02-25 DIAGNOSIS — J449 Chronic obstructive pulmonary disease, unspecified: Secondary | ICD-10-CM

## 2020-02-25 DIAGNOSIS — R002 Palpitations: Secondary | ICD-10-CM

## 2020-02-25 DIAGNOSIS — J439 Emphysema, unspecified: Secondary | ICD-10-CM

## 2020-02-25 DIAGNOSIS — I7121 Aneurysm of the ascending aorta, without rupture: Secondary | ICD-10-CM

## 2020-02-25 DIAGNOSIS — I719 Aortic aneurysm of unspecified site, without rupture: Secondary | ICD-10-CM

## 2020-02-25 LAB — CBC WITH DIFFERENTIAL/PLATELET
Absolute Monocytes: 891 cells/uL (ref 200–950)
Basophils Absolute: 73 cells/uL (ref 0–200)
Basophils Relative: 0.9 %
Eosinophils Absolute: 284 cells/uL (ref 15–500)
Eosinophils Relative: 3.5 %
HCT: 40.8 % (ref 38.5–50.0)
Hemoglobin: 14.1 g/dL (ref 13.2–17.1)
Lymphs Abs: 1256 cells/uL (ref 850–3900)
MCH: 33.1 pg — ABNORMAL HIGH (ref 27.0–33.0)
MCHC: 34.6 g/dL (ref 32.0–36.0)
MCV: 95.8 fL (ref 80.0–100.0)
MPV: 9.5 fL (ref 7.5–12.5)
Monocytes Relative: 11 %
Neutro Abs: 5597 cells/uL (ref 1500–7800)
Neutrophils Relative %: 69.1 %
Platelets: 315 10*3/uL (ref 140–400)
RBC: 4.26 10*6/uL (ref 4.20–5.80)
RDW: 13 % (ref 11.0–15.0)
Total Lymphocyte: 15.5 %
WBC: 8.1 10*3/uL (ref 3.8–10.8)

## 2020-02-25 LAB — TSH: TSH: 3.13 mIU/L (ref 0.40–4.50)

## 2020-02-25 MED ORDER — ATORVASTATIN CALCIUM 80 MG PO TABS: 80.0000 mg | ORAL_TABLET | Freq: Every day | ORAL | 1 refills | Status: DC

## 2020-03-10 ENCOUNTER — Other Ambulatory Visit: Payer: Self-pay | Admitting: Physician Assistant

## 2020-03-10 ENCOUNTER — Other Ambulatory Visit: Payer: Self-pay | Admitting: Adult Health

## 2020-03-22 ENCOUNTER — Other Ambulatory Visit: Payer: Self-pay | Admitting: Physician Assistant

## 2020-03-22 DIAGNOSIS — F411 Generalized anxiety disorder: Secondary | ICD-10-CM

## 2020-04-07 ENCOUNTER — Other Ambulatory Visit: Payer: Self-pay | Admitting: Physician Assistant

## 2020-04-20 ENCOUNTER — Other Ambulatory Visit: Payer: Self-pay | Admitting: Physician Assistant

## 2020-04-20 DIAGNOSIS — F411 Generalized anxiety disorder: Secondary | ICD-10-CM

## 2020-05-13 ENCOUNTER — Telehealth: Payer: Self-pay | Admitting: Internal Medicine

## 2020-05-13 MED ORDER — TRELEGY ELLIPTA 100-62.5-25 MCG/INH IN AEPB: 1.0000 | INHALATION_SPRAY | Freq: Every day | RESPIRATORY_TRACT | 1 refills | Status: DC

## 2020-05-13 NOTE — Telephone Encounter (Signed)
Pt returning a phone call. Pt can be reached at [phone]

## 2020-05-13 NOTE — Telephone Encounter (Signed)
Spoke with pt and advised that samples of Trelegy were left at front desk for pick up. Pt verbalized understanding. Nothing further needed.

## 2020-05-13 NOTE — Telephone Encounter (Signed)
Attempted to call pt but unable to reach. Left message for pt to return call. 

## 2020-05-20 ENCOUNTER — Other Ambulatory Visit: Payer: Self-pay | Admitting: Physician Assistant

## 2020-05-20 DIAGNOSIS — F411 Generalized anxiety disorder: Secondary | ICD-10-CM

## 2020-06-11 ENCOUNTER — Other Ambulatory Visit: Payer: Self-pay | Admitting: Physician Assistant

## 2020-06-17 ENCOUNTER — Other Ambulatory Visit: Payer: Self-pay | Admitting: Adult Health

## 2020-06-17 DIAGNOSIS — F411 Generalized anxiety disorder: Secondary | ICD-10-CM

## 2020-06-24 NOTE — Progress Notes (Signed)
Patient ID: SWAYZE PRIES, male   DOB: [birth year], 59 y.o.   MRN: [id number]  Assessment and Plan:  Hypertension:  -Continue medication, azor samples given- DECLINES CHANGING TO CHEAPER MEDICATION -monitor blood pressure at home.  -Continue DASH diet.   -Reminder to go to the ER if any CP, SOB, nausea, dizziness, severe HA, changes vision/speech, left arm numbness and tingling, and jaw pain.  Cholesterol: -Continue diet and exercise.   Hypothyroidism - continue medications the same, reminded to take on an empty stomach 30-56mins before food.   Obesity with co morbidities - long discussion about weight loss- continue good work on diet Patient limited in exercise  COPD -patient states he is SOB with any movement including showering, ADL's,  following with pulmonary -Breztri samples given, will try to get more but low in the office at this time.  Had CT chest 05/2017- needs repeat however no insurance at this time working at [employer] part time only due to SOB  Anxiety Waiting on disability to go through but was denied- in the repeal process- working at [employer] parts part time only  Ativan 1mg  BID no more than than at a time Atarax did not help Can not take more than 2 a day.   Can not tolerate effexor, zoloft, declines SSRI at this time No SI/HI   Continue diet and meds as discussed. Further disposition pending results of labs. No future appointments.  HPI 59 y.o. male  presents for 3 month follow up with hypertension, hyperlipidemia, prediabetes and vitamin D.   His blood pressure has been controlled at home- has been 120-130, today their BP is BP: 136/82.   He does workout. He denies chest pain, dizziness, he continues to have trouble breathing.   He has asthma/COPD, is following with pulmonary. He has quit his job due to SOB, trouble with ADLS like showering due to SOB and is seeking disability, applied in April and denied in August, has an appeal. Has place to stay at  this time, working at [employer] part time only due to SOB and inablility to work full time per patient.   He has anxiety that affects his breathing, he has done a great job of cutting back from 2 mg TID to 1 mg BID.Will not get more than that and he understands.   He does not have insurance anymore due to quitting his job, he has history of PE, he has history of abnormal CT and is due but has declined due to cost. He did have an echocardiogram 09/2019 that showed EF60-65%, mild AR, normal valves otherwise and mild aortic dilatation.   BMI is Body mass index is 31.4 kg/m., he is working on diet and exercise- has been not eating out due to money, he is feeling a little better with weight loss but still has trouble with ADL's and his breathing is unchanged.  Wt Readings from Last 3 Encounters:  06/29/20 238 lb (108 kg)  02/25/20 248 lb (112.5 kg)  10/09/19 253 lb 12.8 oz (115.1 kg)    He is on thyroid medication. His medication was not changed last visit however, has missed some doses, 1 pill daily but will take when he thinks of it.  Lab Results  Component Value Date   TSH 3.13 02/25/2020    He is on cholesterol medication and denies myalgias. His cholesterol is at goal. The cholesterol last visit was:   Lab Results  Component Value Date   CHOL 163  01/23/2019   HDL 36 (L) 01/23/2019   LDLCALC 103 (H) 01/23/2019   TRIG 141 01/23/2019   CHOLHDL 4.5 01/23/2019    He has been working on diet and exercise for prediabetes, and denies foot ulcerations, hyperglycemia, hypoglycemia , increased appetite, nausea, paresthesia of the feet, polydipsia, polyuria, visual disturbances, vomiting and weight loss. Last A1C in the office was:  Lab Results  Component Value Date   HGBA1C 5.0 06/21/2017   Patient is on Vitamin D supplement.  Lab Results  Component Value Date   VD25OH 25 06/21/2017     Current Medications:  Current Outpatient Medications on File Prior to Visit  Medication Sig  Dispense Refill  . amLODipine-olmesartan (AZOR) 5-40 MG tablet TAKE ONE TABLET BY MOUTH DAILY 90 tablet 1  . atorvastatin (LIPITOR) 80 MG tablet Take 1 tablet (80 mg total) by mouth daily. 30 tablet 1  . bisoprolol-hydrochlorothiazide (ZIAC) 10-6.25 MG tablet TAKE ONE TABLET BY MOUTH DAILY 30 tablet 2  . budesonide-formoterol (SYMBICORT) 160-4.5 MCG/ACT inhaler Inhale 2 puffs into the lungs 2 (two) times daily.    . clobetasol ointment (TEMOVATE) 0.05 % Apply as needed    . Flaxseed, Linseed, (EQL FLAX SEED OIL) 1000 MG CAPS Take 1,000 mg by mouth daily.    . Fluticasone-Umeclidin-Vilant (TRELEGY ELLIPTA) 100-62.5-25 MCG/INH AEPB Inhale 1 puff into the lungs daily. 14 each 1  . levalbuterol (XOPENEX HFA) 45 MCG/ACT inhaler Inhale 2 puffs into the lungs every 6 (six) hours as needed for wheezing or shortness of breath. 1 Inhaler 12  . levothyroxine (SYNTHROID) 100 MCG tablet Take 1 tablet daily on an empty stomach with only water for 30 minutes & no Antacid meds, Calcium or Magnesium for 4 hours & avoid Biotin 90 tablet 0  . LORazepam (ATIVAN) 1 MG tablet TAKE 1/2 TO 1 TABLET BY MOUTH TWO TIMES A DAY AS NEEDED FOR ANXIETY ATTACK 60 tablet 0  . Multiple Vitamins-Minerals (MULTIVITAMIN WITH MINERALS) tablet Take 1 tablet by mouth daily.    . Omega-3 Fatty Acids (FISH OIL) 1000 MG CAPS Take 1,000 mg by mouth daily.     No current facility-administered medications on file prior to visit.    Medical History:  Past Medical History:  Diagnosis Date  . Asthma    Present all life. Dx as child. Been in ICS since 2003--pft 06/03/10 FEV1 3.14/ 82%, FEV1/FVC 0.54; insig resp to BD, DLCO 75%, ni lUNG  Vol  . COPD (chronic obstructive pulmonary disease) ([facility])   . Detached retina   . History of TB skin testing    negative 2005-2010  . Hyperlipidemia   . Hypertension    on lisinopril 2002/2003--changed ARB march 2011  . Lung nodule    70mm on ct 11/10/10- likely lymph node  . Palpitations    started  atenolol since 2003. recollects hx of holter. 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 informed it "Not a dangerous condition"-changed to lopressor 12/15/2010  . Psoriasis    on enbre;l since 005. started in [place]. Stopped 2011 by [place] derm due to nodule on chest  . Pulmonary embolism ([facility])    does not recollect tpA or being on lot of o2 or hypotension. 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 Ace Inhibitors     cough  . Haldol [Haloperidol Lactate]     psychosis  . Haloperidol Lactate     REACTION: phsycotic  break     Review of Systems:  Review of Systems  Constitutional: Negative for chills, diaphoresis and fever.  HENT: Negative for congestion, ear pain and sore throat.   Eyes: Negative for blurred vision and double vision.  Respiratory: Positive for shortness of breath. Negative for cough and wheezing.   Cardiovascular: Positive for palpitations. Negative for chest pain and leg swelling.  Gastrointestinal: Positive for heartburn. Negative for blood in stool, constipation, diarrhea and melena.  Genitourinary: Negative.   Neurological: Negative for headaches.  Psychiatric/Behavioral: The patient is nervous/anxious.     Family history- Review and unchanged  Social history- Review and unchanged  Physical Exam: BP 136/82   Pulse 66   Temp (!) 96.3 F (35.7 C)   Wt 238 lb (108 kg)   SpO2 96%   BMI 31.40 kg/m  Wt Readings from Last 3 Encounters:  06/29/20 238 lb (108 kg)  02/25/20 248 lb (112.5 kg)  10/09/19 253 lb 12.8 oz (115.1 kg)    General Appearance: Well nourished well developed, in no apparent distress. Eyes: PERRLA, EOMs, conjunctiva no swelling or erythema ENT/Mouth: Ear canals normal without obstruction, swelling, erythma, discharge.  TMs normal bilaterally.  Oropharynx moist, clear, without exudate, or postoropharyngeal swelling. Neck: Supple, thyroid normal,no cervical  adenopathy  Respiratory: Respiratory effort normal, Breath sounds clear A&P without rhonchi, wheeze, or rale.  No retractions, no accessory usage. Cardio: RRR with frequent PVC's, no MRGs. Brisk peripheral pulses without edema.  Abdomen: Soft, + BS,  Non tender, no guarding, rebound, hernias, masses. Musculoskeletal: Full ROM, 5/5 strength, Normal gait Skin: Warm, dry without rashes, lesions, ecchymosis.  Neuro: Awake and oriented X 3, Cranial nerves intact. Normal muscle tone, no cerebellar symptoms. Psych: Normal affect, Insight and Judgment appropriate.    Quentin Mulling, PA-C 9:08 AM [facility],

## 2020-06-29 ENCOUNTER — Other Ambulatory Visit: Payer: Self-pay

## 2020-06-29 ENCOUNTER — Ambulatory Visit (INDEPENDENT_AMBULATORY_CARE_PROVIDER_SITE_OTHER): Payer: Self-pay | Admitting: Physician Assistant

## 2020-06-29 ENCOUNTER — Encounter: Payer: Self-pay | Admitting: Physician Assistant

## 2020-06-29 VITALS — BP 136/82 | HR 66 | Temp 96.3°F | Wt 238.0 lb

## 2020-06-29 DIAGNOSIS — I10 Essential (primary) hypertension: Secondary | ICD-10-CM

## 2020-06-29 DIAGNOSIS — R918 Other nonspecific abnormal finding of lung field: Secondary | ICD-10-CM

## 2020-06-29 DIAGNOSIS — J439 Emphysema, unspecified: Secondary | ICD-10-CM

## 2020-06-29 DIAGNOSIS — E782 Mixed hyperlipidemia: Secondary | ICD-10-CM

## 2020-06-29 DIAGNOSIS — Z79899 Other long term (current) drug therapy: Secondary | ICD-10-CM

## 2020-06-29 DIAGNOSIS — I2510 Atherosclerotic heart disease of native coronary artery without angina pectoris: Secondary | ICD-10-CM

## 2020-06-29 DIAGNOSIS — E039 Hypothyroidism, unspecified: Secondary | ICD-10-CM

## 2020-06-29 LAB — COMPLETE METABOLIC PANEL WITH GFR
AG Ratio: 1.4 (calc) (ref 1.0–2.5)
ALT: 29 U/L (ref 9–46)
AST: 25 U/L (ref 10–35)
Albumin: 4 g/dL (ref 3.6–5.1)
Alkaline phosphatase (APISO): 82 U/L (ref 35–144)
BUN/Creatinine Ratio: 10 (calc) (ref 6–22)
BUN: 14 mg/dL (ref 7–25)
CO2: 27 mmol/L (ref 20–32)
Calcium: 9.5 mg/dL (ref 8.6–10.3)
Chloride: 102 mmol/L (ref 98–110)
Creat: 1.34 mg/dL — ABNORMAL HIGH (ref 0.70–1.25)
GFR, Est African American: 66 mL/min/{1.73_m2} (ref >=60)
GFR, Est Non African American: 57 mL/min/{1.73_m2} — ABNORMAL LOW (ref >=60)
Globulin: 2.8 g/dL (calc) (ref 1.9–3.7)
Glucose, Bld: 85 mg/dL (ref 65–99)
Potassium: 4.6 mmol/L (ref 3.5–5.3)
Sodium: 138 mmol/L (ref 135–146)
Total Bilirubin: 0.7 mg/dL (ref 0.2–1.2)
Total Protein: 6.8 g/dL (ref 6.1–8.1)

## 2020-06-29 LAB — LIPID PANEL
Cholesterol: 207 mg/dL — ABNORMAL HIGH (ref <200)
HDL: 37 mg/dL — ABNORMAL LOW (ref >=40)
LDL Cholesterol (Calc): 143 mg/dL (calc) — ABNORMAL HIGH
Non-HDL Cholesterol (Calc): 170 mg/dL (calc) — ABNORMAL HIGH (ref <130)
Total CHOL/HDL Ratio: 5.6 (calc) — ABNORMAL HIGH (ref <5.0)
Triglycerides: 143 mg/dL (ref <150)

## 2020-06-29 LAB — TSH: TSH: 11.68 mIU/L — ABNORMAL HIGH (ref 0.40–4.50)

## 2020-06-29 MED ORDER — SILDENAFIL CITRATE 100 MG PO TABS: ORAL_TABLET | ORAL | 2 refills | Status: AC

## 2020-06-29 NOTE — Patient Instructions (Addendum)
General eating tips  What to Avoid . Avoid added sugars o Often added sugar can be found in processed foods such as many condiments, dry cereals, cakes, cookies, chips, crisps, crackers, candies, sweetened drinks, etc.  o Read labels and AVOID/DECREASE use of foods with the following in their ingredient list: Sugar, fructose, high fructose corn syrup, sucrose, glucose, maltose, dextrose, molasses, cane sugar, brown sugar, any type of syrup, agave nectar, etc.   . Avoid snacking in between meals- drink water or if you feel you need a snack, pick a high water content snack such as cucumbers, watermelon, or any veggie.  . Avoid foods made with flour o If you are going to eat food made with flour, choose those made with whole-grains; and, minimize your consumption as much as is tolerable . Avoid processed foods o These foods are generally stocked in the middle of the grocery store.  o Focus on shopping on the perimeter of the grocery.  What to Include . Vegetables o GREEN LEAFY VEGETABLES: Kale, spinach, mustard greens, collard greens, cabbage, broccoli, etc. o OTHER: Asparagus, cauliflower, eggplant, carrots, peas, Brussel sprouts, tomatoes, bell peppers, zucchini, beets, cucumbers, etc. . Grains, seeds, and legumes o Beans: kidney beans, black eyed peas, garbanzo beans, black beans, pinto beans, etc. o Whole, unrefined grains: brown rice, barley, bulgur, oatmeal, etc. . Healthy fats  o Avoid highly processed fats such as vegetable oil o Examples of healthy fats: avocado, olives, virgin olive oil, dark chocolate (?72% Cocoa), nuts (peanuts, almonds, walnuts, cashews, pecans, etc.) o Please still do small amount of these healthy fats, they are dense in calories.  . Low - Moderate Intake of Animal Sources of Protein o Meat sources: chicken, turkey, salmon, tuna. Limit to 4 ounces of meat at one time or the size of your palm. o Consider limiting dairy sources, but when choosing dairy focus on:  PLAIN Greek yogurt, cottage cheese, high-protein milk . Fruit o Choose berries   Here is some information about the vaccines. The data is very good and this information hopefully answers a lot of your questions and give you a confidence boost.   The Pfizer and Moderna vaccines are messenger RNA vaccines. That technology is not new, it has been studied for 20 years at least, used for cancer and MS treatment. They had started using the vaccine for MERS AND SARS (both a different coronavirus) 10 years ago but never finished so we had a good backbone for this vaccine.   There were no short cuts with the techniques for these clinical  trials, just lots of willing participants quickly and lots of up front money helped speed up the process.   The mRNA is very fragile which is why it needs to be kept so cold and thawed a certain way, think of it as a message in a glass bottle. NO PART of the virus is in this vaccine, it is a clip of the genetic sequence. This mRNA is injected in your arm, connects with a ribosome, delivers the message and the degrades.  That is part of the cause of the sore arm, the mRNA never leaves your arm. It degrades there. The mRNA does not go into our nucleotide where our DNA is, and we would need a DNA reverse transcriptase to take RNA to DNA, we do not have this, it can not change our DNA. The ribosome that got the message creates a protein and that protein circulates in our body and we have an immune   reaction to that creating antibodies. Any time our immune system is triggered, inflammation is triggered too so you can have a temp, muscle aches, etc. Normal reaction.  I have seen so many patients that just had mild COVID in the office weeks later still have issues. We are still learning about post COVID syndrome, the CDC should be coming out for guidelines for practioners soon. There is too much unknown about COVID. We have been using vaccines for over 100 years or more, i trust  science. Ask your parents or any older friends about polio and that vaccine, that was a disease shutting down schools,had kids in iron lung, devastating young kids and families. People lined up for that vaccine and technology has only improved.   Please get the vaccine. If you have any further questions please make an appointment in the office to discuss further.  Matraca Hunkins  

## 2020-06-30 ENCOUNTER — Other Ambulatory Visit: Payer: Self-pay | Admitting: Physician Assistant

## 2020-06-30 NOTE — Addendum Note (Signed)
Addended by: Quentin Mulling R on: 06/30/2020 12:37 PM   Modules accepted: Orders

## 2020-07-02 ENCOUNTER — Other Ambulatory Visit: Payer: Self-pay

## 2020-07-02 MED ORDER — LEVOTHYROXINE SODIUM 100 MCG PO TABS: ORAL_TABLET | ORAL | 1 refills | Status: AC

## 2020-07-06 ENCOUNTER — Telehealth: Payer: Self-pay | Admitting: Physician Assistant

## 2020-07-06 MED ORDER — PREDNISONE 20 MG PO TABS: ORAL_TABLET | ORAL | 0 refills | Status: DC

## 2020-07-06 MED ORDER — AZITHROMYCIN 250 MG PO TABS: ORAL_TABLET | ORAL | 1 refills | Status: AC

## 2020-07-06 NOTE — Telephone Encounter (Signed)
-----   Message from Gregery Na, CMA sent at 07/05/2020  4:01 PM EDT ----- Regarding: office note/phone note Contact: [phone] He said he has congestion & coughing again  He told front office he did not need to come back in but that he should call you.  [employer]/ [employer] [place]

## 2020-07-06 NOTE — Telephone Encounter (Signed)
Patient with known COPD having congestion and cough, will treat as COPD exacerbation. If not better needs office visit.  Go to the ER if any chest pain, shortness of breath, nausea, dizziness, severe HA, changes vision/speech

## 2020-07-07 NOTE — Telephone Encounter (Signed)
Pt aware.

## 2020-07-15 ENCOUNTER — Other Ambulatory Visit: Payer: Self-pay | Admitting: Physician Assistant

## 2020-07-15 DIAGNOSIS — F411 Generalized anxiety disorder: Secondary | ICD-10-CM

## 2020-07-16 ENCOUNTER — Other Ambulatory Visit: Payer: Self-pay

## 2020-07-16 DIAGNOSIS — F411 Generalized anxiety disorder: Secondary | ICD-10-CM

## 2020-07-16 NOTE — Telephone Encounter (Signed)
Lorazepam refill request. In que, for your review.

## 2020-07-18 ENCOUNTER — Other Ambulatory Visit: Payer: Self-pay | Admitting: Physician Assistant

## 2020-07-18 DIAGNOSIS — F411 Generalized anxiety disorder: Secondary | ICD-10-CM

## 2020-07-19 MED ORDER — LORAZEPAM 1 MG PO TABS: ORAL_TABLET | ORAL | 0 refills | Status: DC

## 2020-07-21 ENCOUNTER — Other Ambulatory Visit: Payer: Self-pay | Admitting: Physician Assistant

## 2020-07-21 DIAGNOSIS — F411 Generalized anxiety disorder: Secondary | ICD-10-CM

## 2020-08-05 ENCOUNTER — Other Ambulatory Visit: Payer: Self-pay

## 2020-08-05 DIAGNOSIS — I10 Essential (primary) hypertension: Secondary | ICD-10-CM

## 2020-08-05 DIAGNOSIS — E039 Hypothyroidism, unspecified: Secondary | ICD-10-CM

## 2020-08-05 LAB — COMPLETE METABOLIC PANEL WITH GFR
AG Ratio: 1.6 (calc) (ref 1.0–2.5)
ALT: 50 U/L — ABNORMAL HIGH (ref 9–46)
AST: 47 U/L — ABNORMAL HIGH (ref 10–35)
Albumin: 4.1 g/dL (ref 3.6–5.1)
Alkaline phosphatase (APISO): 74 U/L (ref 35–144)
BUN/Creatinine Ratio: 26 (calc) — ABNORMAL HIGH (ref 6–22)
BUN: 30 mg/dL — ABNORMAL HIGH (ref 7–25)
CO2: 23 mmol/L (ref 20–32)
Calcium: 9.8 mg/dL (ref 8.6–10.3)
Chloride: 100 mmol/L (ref 98–110)
Creat: 1.14 mg/dL (ref 0.70–1.25)
GFR, Est African American: 80 mL/min/{1.73_m2} (ref >=60)
GFR, Est Non African American: 69 mL/min/{1.73_m2} (ref >=60)
Globulin: 2.5 g/dL (calc) (ref 1.9–3.7)
Glucose, Bld: 69 mg/dL (ref 65–99)
Potassium: 4.6 mmol/L (ref 3.5–5.3)
Sodium: 132 mmol/L — ABNORMAL LOW (ref 135–146)
Total Bilirubin: 1 mg/dL (ref 0.2–1.2)
Total Protein: 6.6 g/dL (ref 6.1–8.1)

## 2020-08-05 LAB — TSH: TSH: 2.2 mIU/L (ref 0.40–4.50)

## 2020-08-05 MED ORDER — BREZTRI AEROSPHERE 160-9-4.8 MCG/ACT IN AERO: INHALATION_SPRAY | RESPIRATORY_TRACT | 0 refills | Status: DC

## 2020-08-05 NOTE — Progress Notes (Signed)
Patient here for lab only to recheck TSH and HFP. He states he restarted his Levothyroxine 100 mcg at 1 tablet daily.

## 2020-08-17 ENCOUNTER — Other Ambulatory Visit: Payer: Self-pay | Admitting: Physician Assistant

## 2020-08-17 DIAGNOSIS — F411 Generalized anxiety disorder: Secondary | ICD-10-CM

## 2020-08-19 ENCOUNTER — Other Ambulatory Visit: Payer: Self-pay | Admitting: Internal Medicine

## 2020-09-09 ENCOUNTER — Other Ambulatory Visit: Payer: Self-pay | Admitting: *Deleted

## 2020-09-09 MED ORDER — CLOBETASOL PROPIONATE 0.05 % EX OINT: TOPICAL_OINTMENT | CUTANEOUS | 0 refills | Status: DC

## 2020-09-16 ENCOUNTER — Other Ambulatory Visit: Payer: Self-pay | Admitting: Physician Assistant

## 2020-09-16 DIAGNOSIS — F411 Generalized anxiety disorder: Secondary | ICD-10-CM

## 2020-10-06 ENCOUNTER — Ambulatory Visit: Payer: Self-pay | Admitting: Adult Health Nurse Practitioner

## 2020-10-07 ENCOUNTER — Other Ambulatory Visit: Payer: Self-pay | Admitting: Physician Assistant

## 2020-10-12 ENCOUNTER — Other Ambulatory Visit: Payer: Self-pay

## 2020-10-12 MED ORDER — ATORVASTATIN CALCIUM 80 MG PO TABS: 80.0000 mg | ORAL_TABLET | Freq: Every day | ORAL | 1 refills | Status: AC

## 2020-10-18 ENCOUNTER — Other Ambulatory Visit: Payer: Self-pay | Admitting: Adult Health Nurse Practitioner

## 2020-10-18 ENCOUNTER — Encounter: Payer: Self-pay | Admitting: Adult Health Nurse Practitioner

## 2020-10-18 DIAGNOSIS — F411 Generalized anxiety disorder: Secondary | ICD-10-CM

## 2020-10-18 MED ORDER — LORAZEPAM 1 MG PO TABS: ORAL_TABLET | ORAL | 0 refills | Status: DC

## 2020-10-25 ENCOUNTER — Other Ambulatory Visit: Payer: Self-pay

## 2020-10-25 ENCOUNTER — Ambulatory Visit (INDEPENDENT_AMBULATORY_CARE_PROVIDER_SITE_OTHER): Payer: Self-pay | Admitting: Adult Health Nurse Practitioner

## 2020-10-25 ENCOUNTER — Encounter: Payer: Self-pay | Admitting: Adult Health Nurse Practitioner

## 2020-10-25 VITALS — BP 136/80 | HR 56 | Temp 96.0°F | Ht 73.0 in | Wt 238.0 lb

## 2020-10-25 DIAGNOSIS — F411 Generalized anxiety disorder: Secondary | ICD-10-CM

## 2020-10-25 DIAGNOSIS — E782 Mixed hyperlipidemia: Secondary | ICD-10-CM

## 2020-10-25 DIAGNOSIS — I10 Essential (primary) hypertension: Secondary | ICD-10-CM

## 2020-10-25 DIAGNOSIS — E039 Hypothyroidism, unspecified: Secondary | ICD-10-CM

## 2020-10-25 DIAGNOSIS — J439 Emphysema, unspecified: Secondary | ICD-10-CM

## 2020-10-25 DIAGNOSIS — R002 Palpitations: Secondary | ICD-10-CM

## 2020-10-25 NOTE — Progress Notes (Signed)
FOLLOW UP 4 MONTH  Assessment and Plan:   Hypertension:  -Continue medication, Amlodapine-olmesartan 5-40mg  daily, bisoprolol-HCTZ 10-6.25mg  -monitor blood pressure at home.  -Continue DASH diet.   -Reminder to go to the ER if any CP, SOB, nausea, dizziness, severe HA, changes vision/speech, left arm numbness and tingling, and jaw pain.  Cholesterol: Discussed dietary and exercise modifications  Hypothyroidism Taking levothyroxine mcg daily Reminder to take on an empty stomach 30-53mins before first meal of the day. No antacid medications for 4 hours.   Obesity with co morbidities - long discussion about weight loss- continue good work on diet Patient limited in exercise  COPD -patient states he is SOB with any movement including showering, ADL's,  following with pulmonary -Breztri samples given, will try to get more but low in the office at this time.  Had CT chest 05/2017- needs repeat however no insurance at this time working at [employer] part time only due to SOB  Anxiety Waiting on disability to go through but was denied- in the repeal process- working at [employer] part time only  Ativan 1mg  BID no more than than at a time Atarax did not help Can not take more than 2 a day.   Can not tolerate effexor, zoloft, declines SSRI at this time No SI/HI    Further disposition pending results if labs check today. Discussed med's effects and SE's.   Over 30 minutes of face to face interview, exam, counseling, chart review, and critical decision making was performed.   HPI 59 y.o. male  presents for 3 month follow up with HTN, HLD, prediabetes and vitamin D.   His blood pressure has been controlled at home- has been 120-130, today their BP is BP: 136/80.   He does workout. He denies chest pain, dizziness, he continues to have trouble breathing.   He has asthma/COPD, is following with pulmonary. He has quit his job due to SOB, trouble with ADLS like  showering due to SOB and is seeking disability, applied in April and denied in August, has an appeal. He has hearing scheduled next week, 11/03/20.Has place to stay at this time, working at [employer] part time only due to SOB and inablility to work full time per patient.   He does not have insurance anymore due to quitting his job, he has history of PE, he has history of abnormal CT and is due but has declined due to cost. He did have an echocardiogram 09/2019 that showed EF60-65%, mild AR, normal valves otherwise and mild aortic dilatation.   BMI is Body mass index is 31.4 kg/m., he is working on diet and exercise- has been not eating out due to money, he is feeling a little better with weight loss but still has trouble with ADL's and his breathing is unchanged.  Wt Readings from Last 3 Encounters:  10/25/20 238 lb (108 kg)  06/29/20 238 lb (108 kg)  02/25/20 248 lb (112.5 kg)    He is on thyroid medication. His medication was not changed last visit however, has missed some doses, 1 pill daily but will take when he thinks of it.  Lab Results  Component Value Date   TSH 2.20 08/05/2020    He is on cholesterol medication and denies myalgias. His cholesterol is at goal. The cholesterol last visit was:   Lab Results  Component Value Date   CHOL 207 (H) 06/29/2020   HDL 37 (L) 06/29/2020   LDLCALC 143 (H) 06/29/2020  TRIG 143 06/29/2020   CHOLHDL 5.6 (H) 06/29/2020    He has been working on diet and exercise for prediabetes, and denies foot ulcerations, hyperglycemia, hypoglycemia , increased appetite, nausea, paresthesia of the feet, polydipsia, polyuria, visual disturbances, vomiting and weight loss. Last A1C in the office was:  Lab Results  Component Value Date   HGBA1C 5.0 06/21/2017   Patient is on Vitamin D supplement.  Lab Results  Component Value Date   VD25OH 39 06/21/2017     Current Medications:  Current Outpatient Medications on File Prior to Visit  Medication Sig  Dispense Refill   amLODipine-olmesartan (AZOR) 5-40 MG tablet TAKE ONE TABLET BY MOUTH DAILY 90 tablet 1   atorvastatin (LIPITOR) 80 MG tablet Take 1 tablet (80 mg total) by mouth daily. 30 tablet 1   bisoprolol-hydrochlorothiazide (ZIAC) 10-6.25 MG tablet TAKE ONE TABLET BY MOUTH DAILY 90 tablet 0   Budeson-Glycopyrrol-Formoterol (BREZTRI AEROSPHERE) 160-9-4.8 MCG/ACT AERO 1 puff twice a day into the lungs. 5.9 g 0   budesonide-formoterol (SYMBICORT) 160-4.5 MCG/ACT inhaler Inhale 2 puffs into the lungs 2 (two) times daily.     clobetasol ointment (TEMOVATE) 0.05 % APPLY DAILY AS NEEDED 60 g 3   Flaxseed, Linseed, (EQL FLAX SEED OIL) 1000 MG CAPS Take 1,000 mg by mouth daily.     Fluticasone-Umeclidin-Vilant (TRELEGY ELLIPTA) 100-62.5-25 MCG/INH AEPB Inhale 1 puff into the lungs daily. 14 each 1   levalbuterol (XOPENEX HFA) 45 MCG/ACT inhaler Inhale 2 puffs into the lungs every 6 (six) hours as needed for wheezing or shortness of breath. 1 Inhaler 12   levothyroxine (SYNTHROID) 100 MCG tablet Take 1 tablet daily on an empty stomach with only water for 30 minutes & no Antacid meds, Calcium or Magnesium for 4 hours & avoid Biotin 90 tablet 1   LORazepam (ATIVAN) 1 MG tablet TAKE 1/2 TO 1 TABLET BY MOUTH TWO TIMES A DAY AS NEEDED FOR  ANXIETY ATTACK 60 tablet 0   Multiple Vitamins-Minerals (MULTIVITAMIN WITH MINERALS) tablet Take 1 tablet by mouth daily.     Omega-3 Fatty Acids (FISH OIL) 1000 MG CAPS Take 1,000 mg by mouth daily.     sildenafil (VIAGRA) 100 MG tablet 1/2-1 pill as needed 1 hour before intercourse, can get with good RX from [facility] 10 tablet 2   No current facility-administered medications on file prior to visit.    Medical History:  Past Medical History:  Diagnosis Date   Asthma    Present all life. Dx as child. Been in [facility] since 2003--pft 06/03/10 FEV1 3.14/ 82%, FEV1/FVC 0.54; insig resp to BD, DLCO 75%, ni lUNG  Vol   COPD (chronic obstructive pulmonary  disease) (HCC)    Detached retina    History of TB skin testing    negative 2005-2010   Hyperlipidemia    Hypertension    on lisinopril 2002/2003--changed ARB march 2011   Lung nodule    57mm on ct 11/10/10- likely lymph node   Palpitations    started atenolol since 2003. recollects hx of holter. 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 informed it "Not a dangerous condition"-changed to lopressor 12/15/2010   Psoriasis    on enbre;l since 005. started in [place]. Stopped 2011 by [place] derm due to nodule on chest   Pulmonary embolism (HCC)    does not recollect tpA or being on lot of o2 or hypotension. 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  HENT: Negative for congestion, ear discharge, ear pain, hearing loss, nosebleeds, sinus pain and tinnitus.   Eyes: Negative for blurred vision, double vision and photophobia.  Respiratory: Negative for cough, hemoptysis, sputum production and shortness of breath.   Cardiovascular: Negative for chest pain, palpitations, orthopnea and claudication.  Gastrointestinal: Negative for abdominal pain, constipation, diarrhea, heartburn, nausea and vomiting.  Genitourinary: Negative for dysuria, frequency, hematuria and urgency.  Musculoskeletal: Negative for back pain, falls, joint pain, myalgias and neck pain.  Skin: Negative for itching and rash.  Neurological: Negative for dizziness, tingling, tremors, sensory change, speech change and headaches.  Endo/Heme/Allergies: Negative for environmental allergies. Does not bruise/bleed easily.  Psychiatric/Behavioral: Negative for depression, hallucinations, memory loss,  substance abuse and suicidal ideas. The patient is not nervous/anxious and does not have insomnia.     Family history- Review and unchanged  Social history- Review and unchanged  Physical Exam: BP 136/80    Pulse (!) 56    Temp (!) 96 F (35.6 C)    Ht 6\' 1"  (1.854 m)    Wt 238 lb (108 kg)    SpO2 96%    BMI 31.40 kg/m  Wt Readings from Last 3 Encounters:  10/25/20 238 lb (108 kg)  06/29/20 238 lb (108 kg)  02/25/20 248 lb (112.5 kg)    General Appearance: Well nourished well developed, in no apparent distress. Eyes: PERRLA, EOMs, conjunctiva no swelling or erythema ENT/Mouth: Ear canals normal without obstruction, swelling, erythma, discharge.  TMs normal bilaterally.  Oropharynx moist, clear, without exudate, or postoropharyngeal swelling. Neck: Supple, thyroid normal,no cervical adenopathy  Respiratory: Respiratory effort normal, Breath sounds clear A&P without rhonchi, wheeze, or rale.  No retractions, no accessory usage. Cardio: RRR with frequent PVC's, no MRGs. Brisk peripheral pulses without edema.  Abdomen: Soft, + BS,  Non tender, no guarding, rebound, hernias, masses. Musculoskeletal: Full ROM, 5/5 strength, Normal gait Skin: Warm, dry without rashes, lesions, ecchymosis.  Neuro: Awake and oriented X 3, Cranial nerves intact. Normal muscle tone, no cerebellar symptoms. Psych: Normal affect, Insight and Judgment appropriate.    04/26/20, NP 11:49 AM [facility],

## 2020-10-26 LAB — CBC WITH DIFFERENTIAL/PLATELET
Absolute Monocytes: 922 cells/uL (ref 200–950)
Basophils Absolute: 76 cells/uL (ref 0–200)
Basophils Relative: 0.8 %
Eosinophils Absolute: 323 cells/uL (ref 15–500)
Eosinophils Relative: 3.4 %
HCT: 42.2 % (ref 38.5–50.0)
Hemoglobin: 14.5 g/dL (ref 13.2–17.1)
Lymphs Abs: 1473 cells/uL (ref 850–3900)
MCH: 33.6 pg — ABNORMAL HIGH (ref 27.0–33.0)
MCHC: 34.4 g/dL (ref 32.0–36.0)
MCV: 97.7 fL (ref 80.0–100.0)
MPV: 9.5 fL (ref 7.5–12.5)
Monocytes Relative: 9.7 %
Neutro Abs: 6707 cells/uL (ref 1500–7800)
Neutrophils Relative %: 70.6 %
Platelets: 304 10*3/uL (ref 140–400)
RBC: 4.32 10*6/uL (ref 4.20–5.80)
RDW: 12.3 % (ref 11.0–15.0)
Total Lymphocyte: 15.5 %
WBC: 9.5 10*3/uL (ref 3.8–10.8)

## 2020-10-26 LAB — COMPLETE METABOLIC PANEL WITH GFR
AG Ratio: 1.7 (calc) (ref 1.0–2.5)
ALT: 38 U/L (ref 9–46)
AST: 29 U/L (ref 10–35)
Albumin: 4.2 g/dL (ref 3.6–5.1)
Alkaline phosphatase (APISO): 77 U/L (ref 35–144)
BUN: 15 mg/dL (ref 7–25)
CO2: 30 mmol/L (ref 20–32)
Calcium: 9.7 mg/dL (ref 8.6–10.3)
Chloride: 101 mmol/L (ref 98–110)
Creat: 0.99 mg/dL (ref 0.70–1.25)
GFR, Est African American: 95 mL/min/{1.73_m2} (ref >=60)
GFR, Est Non African American: 82 mL/min/{1.73_m2} (ref >=60)
Globulin: 2.5 g/dL (calc) (ref 1.9–3.7)
Glucose, Bld: 83 mg/dL (ref 65–99)
Potassium: 5 mmol/L (ref 3.5–5.3)
Sodium: 138 mmol/L (ref 135–146)
Total Bilirubin: 1.1 mg/dL (ref 0.2–1.2)
Total Protein: 6.7 g/dL (ref 6.1–8.1)

## 2020-10-26 LAB — TSH: TSH: 3.68 mIU/L (ref 0.40–4.50)

## 2020-11-02 ENCOUNTER — Other Ambulatory Visit: Payer: Self-pay | Admitting: Internal Medicine

## 2020-11-02 MED ORDER — BISOPROLOL-HYDROCHLOROTHIAZIDE 10-6.25 MG PO TABS: ORAL_TABLET | ORAL | 0 refills | Status: AC

## 2020-11-16 ENCOUNTER — Other Ambulatory Visit: Payer: Self-pay | Admitting: Adult Health Nurse Practitioner

## 2020-11-16 DIAGNOSIS — F411 Generalized anxiety disorder: Secondary | ICD-10-CM

## 2020-11-18 ENCOUNTER — Other Ambulatory Visit: Payer: Self-pay | Admitting: Adult Health Nurse Practitioner

## 2020-11-18 DIAGNOSIS — F411 Generalized anxiety disorder: Secondary | ICD-10-CM

## 2020-12-05 ENCOUNTER — Other Ambulatory Visit: Payer: Self-pay | Admitting: Internal Medicine

## 2020-12-05 DIAGNOSIS — I10 Essential (primary) hypertension: Secondary | ICD-10-CM

## 2020-12-05 MED ORDER — AMLODIPINE-OLMESARTAN 5-40 MG PO TABS: ORAL_TABLET | ORAL | 0 refills | Status: AC

## 2020-12-20 ENCOUNTER — Other Ambulatory Visit: Payer: Self-pay | Admitting: Adult Health

## 2020-12-20 DIAGNOSIS — F411 Generalized anxiety disorder: Secondary | ICD-10-CM

## 2020-12-20 MED ORDER — LORAZEPAM 1 MG PO TABS: ORAL_TABLET | ORAL | 0 refills | Status: DC

## 2020-12-20 NOTE — Progress Notes (Signed)
Future Appointments  Date Time Provider Department Center  02/23/2021  3:00 PM Elder Negus, NP [facility] None   PDMP reviewed for lorazepam refill request.

## 2021-01-13 ENCOUNTER — Other Ambulatory Visit: Payer: Self-pay | Admitting: Adult Health Nurse Practitioner

## 2021-01-13 DIAGNOSIS — J439 Emphysema, unspecified: Secondary | ICD-10-CM

## 2021-01-13 MED ORDER — TRELEGY ELLIPTA 100-62.5-25 MCG/INH IN AEPB: 1.0000 | INHALATION_SPRAY | Freq: Every day | RESPIRATORY_TRACT | 1 refills | Status: DC

## 2021-01-17 ENCOUNTER — Other Ambulatory Visit: Payer: Self-pay

## 2021-01-17 DIAGNOSIS — J439 Emphysema, unspecified: Secondary | ICD-10-CM

## 2021-01-17 MED ORDER — TRELEGY ELLIPTA 100-62.5-25 MCG/INH IN AEPB: 1.0000 | INHALATION_SPRAY | Freq: Every day | RESPIRATORY_TRACT | 1 refills | Status: AC

## 2021-01-20 ENCOUNTER — Other Ambulatory Visit: Payer: Self-pay | Admitting: Adult Health

## 2021-01-20 DIAGNOSIS — F411 Generalized anxiety disorder: Secondary | ICD-10-CM

## 2021-02-23 ENCOUNTER — Encounter: Payer: Self-pay | Admitting: Adult Health Nurse Practitioner

## 2022-08-07 ENCOUNTER — Other Ambulatory Visit: Payer: Self-pay

## 2022-08-07 ENCOUNTER — Encounter (HOSPITAL_COMMUNITY): Payer: Self-pay | Admitting: Emergency Medicine

## 2022-08-07 ENCOUNTER — Emergency Department (HOSPITAL_COMMUNITY): Payer: Medicare Other

## 2022-08-07 ENCOUNTER — Emergency Department (HOSPITAL_COMMUNITY)
Admission: EM | Admit: 2022-08-07 | Discharge: 2022-08-07 | Disposition: A | Discharge status: Home / Self Care | Payer: Medicare Other | Attending: Emergency Medicine | Admitting: Emergency Medicine

## 2022-08-07 DIAGNOSIS — X58XXXA Exposure to other specified factors, initial encounter: Secondary | ICD-10-CM | POA: Diagnosis not present

## 2022-08-07 DIAGNOSIS — S299XXA Unspecified injury of thorax, initial encounter: Secondary | ICD-10-CM | POA: Diagnosis present

## 2022-08-07 DIAGNOSIS — J069 Acute upper respiratory infection, unspecified: Secondary | ICD-10-CM | POA: Diagnosis not present

## 2022-08-07 DIAGNOSIS — Z20822 Contact with and (suspected) exposure to covid-19: Secondary | ICD-10-CM | POA: Insufficient documentation

## 2022-08-07 DIAGNOSIS — E871 Hypo-osmolality and hyponatremia: Secondary | ICD-10-CM | POA: Diagnosis not present

## 2022-08-07 DIAGNOSIS — J449 Chronic obstructive pulmonary disease, unspecified: Secondary | ICD-10-CM | POA: Diagnosis not present

## 2022-08-07 DIAGNOSIS — S2231XA Fracture of one rib, right side, initial encounter for closed fracture: Secondary | ICD-10-CM | POA: Insufficient documentation

## 2022-08-07 DIAGNOSIS — Z7951 Long term (current) use of inhaled steroids: Secondary | ICD-10-CM | POA: Diagnosis not present

## 2022-08-07 LAB — CBC WITH DIFFERENTIAL/PLATELET
Abs Immature Granulocytes: 0.03 10*3/uL (ref 0.00–0.07)
Basophils Absolute: 0 10*3/uL (ref 0.0–0.1)
Basophils Relative: 1 %
Eosinophils Absolute: 0.3 10*3/uL (ref 0.0–0.5)
Eosinophils Relative: 4 %
HCT: 41.8 % (ref 39.0–52.0)
Hemoglobin: 14.7 g/dL (ref 13.0–17.0)
Immature Granulocytes: 0 %
Lymphocytes Relative: 18 %
Lymphs Abs: 1.4 10*3/uL (ref 0.7–4.0)
MCH: 33.6 pg (ref 26.0–34.0)
MCHC: 35.2 g/dL (ref 30.0–36.0)
MCV: 95.7 fL (ref 80.0–100.0)
Monocytes Absolute: 1.2 10*3/uL — ABNORMAL HIGH (ref 0.1–1.0)
Monocytes Relative: 15 %
Neutro Abs: 5.1 10*3/uL (ref 1.7–7.7)
Neutrophils Relative %: 62 %
Platelets: 255 10*3/uL (ref 150–400)
RBC: 4.37 MIL/uL (ref 4.22–5.81)
RDW: 12.8 % (ref 11.5–15.5)
WBC: 8.1 10*3/uL (ref 4.0–10.5)
nRBC: 0 % (ref 0.0–0.2)

## 2022-08-07 LAB — BASIC METABOLIC PANEL
Anion gap: 9 (ref 5–15)
BUN: 23 mg/dL (ref 8–23)
CO2: 23 mmol/L (ref 22–32)
Calcium: 9.1 mg/dL (ref 8.9–10.3)
Chloride: 100 mmol/L (ref 98–111)
Creatinine, Ser: 1.19 mg/dL (ref 0.61–1.24)
GFR, Estimated: >60 mL/min (ref >60)
Glucose, Bld: 89 mg/dL (ref 70–99)
Potassium: 3.8 mmol/L (ref 3.5–5.1)
Sodium: 132 mmol/L — ABNORMAL LOW (ref 135–145)

## 2022-08-07 LAB — SARS CORONAVIRUS 2 BY RT PCR: SARS Coronavirus 2 by RT PCR: NEGATIVE

## 2022-08-07 LAB — TROPONIN I (HIGH SENSITIVITY): Troponin I (High Sensitivity): 4 ng/L (ref <18)

## 2022-08-07 MED ORDER — IOHEXOL 300 MG/ML  SOLN
75.0000 mL | Freq: Once | INTRAMUSCULAR | Status: AC | PRN
Start: 2022-08-07 — End: 2022-08-07
  Administered 2022-08-07: 75 mL via INTRAVENOUS

## 2022-08-07 MED ORDER — SODIUM CHLORIDE (PF) 0.9 % IJ SOLN
INTRAMUSCULAR | Status: AC
  Filled 2022-08-07: qty 50

## 2022-08-07 NOTE — Discharge Instructions (Addendum)
You were seen in the emergency department for some right-sided sharp chest pain with cough.  You had a chest x-ray and a CAT scan of your chest that showed a right fifth rib fracture with a small hematoma.  There was no signs of pneumothorax or pneumonia.  Please use Tylenol and ibuprofen for pain.  Follow-up with your regular doctor.  Return to the emergency department if any worsening or concerning symptoms.

## 2022-08-07 NOTE — ED Provider Notes (Signed)
[facility]-EMERGENCY DEPT Provider Note   CSN: [id number] Arrival date & time: 08/07/22  1451     History {Add pertinent medical, surgical, social history, OB history to HPI:1} Chief Complaint  Patient presents with   Cough   Rib Cage Pain     Samuel Sharp is a 61 y.o. male.  He has a history of COPD and he said he gets a bacterial infection in his lungs quite frequently.  He started with a cough and some right-sided stabbing lateral chest pain about a week ago.  Went to [facility] about 5 days ago and they treated him with Augmentin.  Does not feel any better.  He is not sure if he has had a fever or chills.  He has had a cough with some sputum but he does not know what color.  No hemoptysis.  Complaining of severe pain when he coughs with some baseline pain on the right lateral side when he breathes.  Not unusual for patient when he gets these infections.  No other complaints.  The history is provided by the patient.  Cough Cough characteristics:  Productive Sputum characteristics:  Nondescript Severity:  Moderate Onset quality:  Gradual Duration:  1 week Timing:  Intermittent Progression:  Unchanged Chronicity:  Recurrent Smoker: no   Relieved by:  Nothing Worsened by:  Nothing Ineffective treatments: antibiotics. Associated symptoms: chest pain and shortness of breath   Associated symptoms: no chills, no fever, no headaches, no rash and no sore throat   Chest pain:    Quality: sharp and stabbing     Severity:  Severe   Onset quality:  Gradual   Duration:  1 week   Timing:  Intermittent   Progression:  Unchanged   Chronicity:  Recurrent      Home Medications Prior to Admission medications   Medication Sig Start Date End Date Taking? Authorizing Provider  amLODipine-olmesartan (AZOR) 5-40 MG tablet Take      1 tablet       Daily      for BP 12/05/20   Lucky Cowboy, MD  atorvastatin (LIPITOR) 80 MG tablet Take 1 tablet (80 mg total) by  mouth daily. 10/12/20   Elder Negus, NP  bisoprolol-hydrochlorothiazide [facility]) 10-6.25 MG tablet Take     1 tablet      Daily        for BP 11/02/20   Lucky Cowboy, MD  clobetasol ointment (TEMOVATE) 0.05 % APPLY DAILY AS NEEDED 10/07/20   Lucky Cowboy, MD  Flaxseed, Linseed, (EQL FLAX SEED OIL) 1000 MG CAPS Take 1,000 mg by mouth daily.    [provider]  Fluticasone-Umeclidin-Vilant (TRELEGY ELLIPTA) 100-62.5-25 MCG/INH AEPB Inhale 1 puff into the lungs daily. 01/17/21   Judd Gaudier, NP  levalbuterol [facility] HFA) 45 MCG/ACT inhaler Inhale 2 puffs into the lungs every 6 (six) hours as needed for wheezing or shortness of breath. 02/21/18   Doree Albee, PA-C  levothyroxine (SYNTHROID) 100 MCG tablet Take 1 tablet daily on an empty stomach with only water for 30 minutes & no Antacid meds, Calcium or Magnesium for 4 hours & avoid Biotin 07/02/20   Doree Albee, PA-C  LORazepam (ATIVAN) 1 MG tablet Take 1/2 to 1 tablet  1 to 2 x /day  ONLY  if needed for Panic or Anxiety Attack & Avoid taking Daily due to Addictive Potential.  Should last longer than 30 days. 01/22/21   Lucky Cowboy, MD  Multiple Vitamins-Minerals (MULTIVITAMIN WITH  MINERALS) tablet Take 1 tablet by mouth daily.    [provider]  Omega-3 Fatty Acids (FISH OIL) 1000 MG CAPS Take 1,000 mg by mouth daily.    [provider]  sildenafil (VIAGRA) 100 MG tablet 1/2-1 pill as needed 1 hour before intercourse, can get with good RX from [facility] 06/29/20   Doree Albee, PA-C      Allergies    Ace inhibitors, Haldol [haloperidol lactate], and Haloperidol lactate    Review of Systems   Review of Systems  Constitutional:  Negative for chills and fever.  HENT:  Negative for sore throat.   Respiratory:  Positive for cough and shortness of breath.   Cardiovascular:  Positive for chest pain.  Gastrointestinal:  Negative for abdominal pain.  Genitourinary:  Negative for dysuria.   Musculoskeletal:  Negative for neck pain.  Skin:  Negative for rash.  Neurological:  Negative for headaches.    Physical Exam Updated Vital Signs BP 132/81   Pulse (!) 58   Temp 98.9 F (37.2 C) (Oral)   Resp 14   SpO2 93%  Physical Exam Vitals and nursing note reviewed.  Constitutional:      General: He is not in acute distress.    Appearance: Normal appearance. He is well-developed.  HENT:     Head: Normocephalic and atraumatic.  Eyes:     Conjunctiva/sclera: Conjunctivae normal.  Cardiovascular:     Rate and Rhythm: Normal rate and regular rhythm.     Heart sounds: No murmur heard. Pulmonary:     Effort: Pulmonary effort is normal. No respiratory distress.     Breath sounds: Normal breath sounds.  Abdominal:     Palpations: Abdomen is soft.     Tenderness: There is no abdominal tenderness.  Musculoskeletal:        General: Normal range of motion.     Cervical back: Neck supple.     Right lower leg: No edema.     Left lower leg: No edema.  Skin:    General: Skin is warm and dry.     Capillary Refill: Capillary refill takes less than 2 seconds.  Neurological:     General: No focal deficit present.     Mental Status: He is alert.     ED Results / Procedures / Treatments   Labs (all labs ordered are listed, but only abnormal results are displayed) Labs Reviewed  BASIC METABOLIC PANEL  CBC WITH DIFFERENTIAL/PLATELET  TROPONIN I (HIGH SENSITIVITY)    EKG None  Radiology DG Chest 2 View  Result Date: 08/07/2022 CLINICAL DATA:  Rib pain. EXAM: CHEST - 2 VIEW COMPARISON:  Chest x-ray 07/07/2014.  Chest CT 06/21/2017. FINDINGS: There are acute appearing right fifth and sixth rib fractures with some adjacent pleural thickening. Difficult to exclude pneumothorax in the right lung apex secondary to emphysema in this region. No mediastinal shift. There is a band of atelectasis in the right upper lobe. No pleural effusion. Cardiac silhouette is within normal limits.  IMPRESSION: 1. Acute appearing right fifth and sixth rib fractures with adjacent pleural thickening, likely hematoma. 2. Difficult to exclude small apical pneumothorax on the right secondary to emphysema. Consider further evaluation with chest CT. 3. Right upper lobe atelectasis. Electronically Signed   By: Darliss Cheney M.D.   On: 08/07/2022 15:35    Procedures Procedures  {Document cardiac monitor, telemetry assessment procedure when appropriate:1}  Medications Ordered in ED Medications - No data to display  ED Course/ Medical  Decision Making/ A&P                           Medical Decision Making Amount and/or Complexity of Data Reviewed Radiology: ordered.  This patient complains of ***; this involves an extensive number of treatment Options and is a complaint that carries with it a high risk of complications and morbidity. The differential includes ***  I ordered, reviewed and interpreted labs, which included *** I ordered medication *** and reviewed PMP when indicated. I ordered imaging studies which included *** and I independently    visualized and interpreted imaging which showed *** Additional history obtained from *** Previous records obtained and reviewed *** I consulted *** and discussed lab and imaging findings and discussed disposition.  Cardiac monitoring reviewed, *** Social determinants considered, *** Critical Interventions: ***  After the interventions stated above, I reevaluated the patient and found *** Admission and further testing considered, ***    {Document critical care time when appropriate:1} {Document review of labs and clinical decision tools ie heart score, Chads2Vasc2 etc:1}  {Document your independent review of radiology images, and any outside records:1} {Document your discussion with family members, caretakers, and with consultants:1} {Document social determinants of health affecting pt's care:1} {Document your decision making why or why not  admission, treatments were needed:1} Final Clinical Impression(s) / ED Diagnoses Final diagnoses:  None    Rx / DC Orders ED Discharge Orders     None

## 2022-08-07 NOTE — ED Provider Triage Note (Signed)
Emergency Medicine Provider Triage Evaluation Note  Samuel Sharp , a 61 y.o. male  was evaluated in triage.  Pt complains of continued right-sided lung pain after completing 5-day course of Augmentin.  Patient has emphysema and was diagnosed with pneumonia by his PCP.  He states that he gets pneumonia in his right lung at least once a year, he was told to come to the emergency department if his symptoms were not improved.  He has now completed the antibiotics and continues to have cough and pain, worse with breathing.  No new shortness of breath, no fevers, no chills, no nausea no vomiting or diarrhea.  Review of Systems  Positive: As above Negative:   Physical Exam  BP (!) 113/98 (BP Location: Left Arm)   Pulse (!) 56   Temp 98.9 F (37.2 C) (Oral)   Resp 18   SpO2 95%  Gen:   Awake, no distress   Resp:  Normal effort; diminished breath sounds bilaterally, no obvious rales or rhonchi MSK:   Moves extremities without difficulty  Other:    Medical Decision Making  Medically screening exam initiated at 3:25 PM.  Appropriate orders placed.  Samuel Sharp was informed that the remainder of the evaluation will be completed by another provider, this initial triage assessment does not replace that evaluation, and the importance of remaining in the ED until their evaluation is complete.     Janell Quiet, [place] 08/07/22 1527

## 2022-08-07 NOTE — ED Triage Notes (Signed)
Pt reports hx emphysema. Pt reports pain on right side when breathing deep or coughing.
